# Patient Record
Sex: Female | Born: 1975 | Race: White | Hispanic: Yes | Marital: Single | Smoking: Never smoker
Health system: Southern US, Community
[De-identification: ages and names within clinical notes are randomized; demographics above are authoritative.]

## PROBLEM LIST (undated history)

## (undated) ENCOUNTER — Emergency Department (HOSPITAL_COMMUNITY): Admission: EM | Payer: Self-pay | Source: Home / Self Care

## (undated) DIAGNOSIS — I10 Essential (primary) hypertension: Secondary | ICD-10-CM

## (undated) DIAGNOSIS — G709 Myoneural disorder, unspecified: Secondary | ICD-10-CM

## (undated) DIAGNOSIS — N186 End stage renal disease: Secondary | ICD-10-CM

## (undated) DIAGNOSIS — E119 Type 2 diabetes mellitus without complications: Secondary | ICD-10-CM

## (undated) DIAGNOSIS — E7800 Pure hypercholesterolemia, unspecified: Secondary | ICD-10-CM

## (undated) HISTORY — PX: NO PAST SURGERIES: SHX2092

## (undated) HISTORY — PX: OTHER SURGICAL HISTORY: SHX169

## (undated) HISTORY — DX: End stage renal disease: N18.6

---

## 2003-04-12 ENCOUNTER — Inpatient Hospital Stay (HOSPITAL_COMMUNITY): Admission: AD | Admit: 2003-04-12 | Discharge: 2003-04-14 | Payer: Self-pay | Admitting: Obstetrics

## 2004-05-25 ENCOUNTER — Encounter (INDEPENDENT_AMBULATORY_CARE_PROVIDER_SITE_OTHER): Payer: Self-pay | Admitting: *Deleted

## 2004-05-25 LAB — CONVERTED CEMR LAB

## 2004-05-27 ENCOUNTER — Ambulatory Visit: Payer: Self-pay | Admitting: Family Medicine

## 2004-06-17 ENCOUNTER — Ambulatory Visit: Payer: Self-pay | Admitting: Family Medicine

## 2004-06-24 ENCOUNTER — Ambulatory Visit (HOSPITAL_COMMUNITY): Admission: RE | Admit: 2004-06-24 | Discharge: 2004-06-24 | Payer: Self-pay | Admitting: Family Medicine

## 2004-07-08 ENCOUNTER — Ambulatory Visit: Payer: Self-pay | Admitting: Family Medicine

## 2004-08-13 ENCOUNTER — Ambulatory Visit: Payer: Self-pay | Admitting: Family Medicine

## 2004-08-16 ENCOUNTER — Ambulatory Visit: Payer: Self-pay | Admitting: Sports Medicine

## 2004-08-28 ENCOUNTER — Encounter: Admission: RE | Admit: 2004-08-28 | Discharge: 2004-10-09 | Payer: Self-pay | Admitting: Family Medicine

## 2004-09-13 ENCOUNTER — Ambulatory Visit: Payer: Self-pay | Admitting: Family Medicine

## 2004-09-30 ENCOUNTER — Ambulatory Visit: Payer: Self-pay | Admitting: Sports Medicine

## 2004-10-17 ENCOUNTER — Ambulatory Visit: Payer: Self-pay | Admitting: Family Medicine

## 2004-10-31 ENCOUNTER — Ambulatory Visit: Payer: Self-pay | Admitting: Sports Medicine

## 2004-11-01 ENCOUNTER — Ambulatory Visit: Payer: Self-pay | Admitting: *Deleted

## 2004-11-05 ENCOUNTER — Ambulatory Visit: Payer: Self-pay | Admitting: *Deleted

## 2004-11-07 ENCOUNTER — Ambulatory Visit: Payer: Self-pay | Admitting: Family Medicine

## 2004-11-08 ENCOUNTER — Ambulatory Visit (HOSPITAL_COMMUNITY): Admission: RE | Admit: 2004-11-08 | Discharge: 2004-11-08 | Payer: Self-pay | Admitting: Family Medicine

## 2004-11-08 ENCOUNTER — Ambulatory Visit: Payer: Self-pay | Admitting: *Deleted

## 2004-11-14 ENCOUNTER — Ambulatory Visit: Payer: Self-pay | Admitting: Sports Medicine

## 2004-11-15 ENCOUNTER — Ambulatory Visit (HOSPITAL_COMMUNITY): Admission: RE | Admit: 2004-11-15 | Discharge: 2004-11-15 | Payer: Self-pay | Admitting: Family Medicine

## 2004-11-15 ENCOUNTER — Ambulatory Visit: Payer: Self-pay | Admitting: *Deleted

## 2004-11-18 ENCOUNTER — Inpatient Hospital Stay (HOSPITAL_COMMUNITY): Admission: AD | Admit: 2004-11-18 | Discharge: 2004-11-20 | Payer: Self-pay | Admitting: Family Medicine

## 2004-11-18 ENCOUNTER — Ambulatory Visit: Payer: Self-pay | Admitting: Family Medicine

## 2005-01-01 ENCOUNTER — Ambulatory Visit: Payer: Self-pay | Admitting: Family Medicine

## 2005-03-27 ENCOUNTER — Ambulatory Visit: Payer: Self-pay | Admitting: Sports Medicine

## 2005-06-16 ENCOUNTER — Ambulatory Visit: Payer: Self-pay | Admitting: Sports Medicine

## 2006-10-23 ENCOUNTER — Encounter (INDEPENDENT_AMBULATORY_CARE_PROVIDER_SITE_OTHER): Payer: Self-pay | Admitting: *Deleted

## 2008-08-02 ENCOUNTER — Inpatient Hospital Stay (HOSPITAL_COMMUNITY): Admission: AD | Admit: 2008-08-02 | Discharge: 2008-08-02 | Payer: Self-pay | Admitting: Obstetrics & Gynecology

## 2008-08-07 ENCOUNTER — Encounter: Admission: RE | Admit: 2008-08-07 | Discharge: 2008-11-05 | Payer: Self-pay | Admitting: Family Medicine

## 2008-08-07 ENCOUNTER — Ambulatory Visit: Payer: Self-pay | Admitting: Family Medicine

## 2008-08-08 ENCOUNTER — Ambulatory Visit: Payer: Self-pay | Admitting: Obstetrics & Gynecology

## 2008-08-08 ENCOUNTER — Encounter: Payer: Self-pay | Admitting: Family Medicine

## 2008-08-08 LAB — CONVERTED CEMR LAB
ALT: 11 units/L (ref 0–35)
AST: 8 units/L (ref 0–37)
Albumin: 3.9 g/dL (ref 3.5–5.2)
Alkaline Phosphatase: 86 units/L (ref 39–117)
BUN: 9 mg/dL (ref 6–23)
CO2: 18 meq/L — ABNORMAL LOW (ref 19–32)
Calcium: 8.6 mg/dL (ref 8.4–10.5)
Chloride: 102 meq/L (ref 96–112)
Creatinine, Ser: 0.37 mg/dL — ABNORMAL LOW (ref 0.40–1.20)
Glucose, Bld: 171 mg/dL — ABNORMAL HIGH (ref 70–99)
HCT: 42.1 % (ref 36.0–46.0)
Hemoglobin: 14.1 g/dL (ref 12.0–15.0)
Hgb A1c MFr Bld: 7 % — ABNORMAL HIGH (ref 4.6–6.1)
MCHC: 33.5 g/dL (ref 30.0–36.0)
MCV: 90.5 fL (ref 78.0–100.0)
Platelets: 248 10*3/uL (ref 150–400)
Potassium: 3.9 meq/L (ref 3.5–5.3)
RBC: 4.65 M/uL (ref 3.87–5.11)
RDW: 14.3 % (ref 11.5–15.5)
Sodium: 138 meq/L (ref 135–145)
TSH: 0.392 microintl units/mL (ref 0.350–4.50)
Total Bilirubin: 0.5 mg/dL (ref 0.3–1.2)
Total Protein: 6.7 g/dL (ref 6.0–8.3)
Uric Acid, Serum: 2.8 mg/dL (ref 2.4–7.0)
WBC: 10.1 10*3/uL (ref 4.0–10.5)

## 2008-08-09 ENCOUNTER — Encounter: Payer: Self-pay | Admitting: Family Medicine

## 2008-08-09 LAB — CONVERTED CEMR LAB
Collection Interval-CRCL: 24 hr
Creatinine 24 HR UR: 686 mg/24hr — ABNORMAL LOW (ref 700–1800)
Creatinine Clearance: 129 mL/min — ABNORMAL HIGH (ref 75–115)
Creatinine, Urine: 33.5 mg/dL
Protein, Ur: 62 mg/24hr (ref 50–100)

## 2008-08-14 ENCOUNTER — Ambulatory Visit: Payer: Self-pay | Admitting: Family Medicine

## 2008-08-14 ENCOUNTER — Inpatient Hospital Stay (HOSPITAL_COMMUNITY): Admission: AD | Admit: 2008-08-14 | Discharge: 2008-08-18 | Payer: Self-pay | Admitting: Family Medicine

## 2008-08-21 ENCOUNTER — Ambulatory Visit: Payer: Self-pay | Admitting: Family Medicine

## 2008-08-28 ENCOUNTER — Ambulatory Visit: Payer: Self-pay | Admitting: Obstetrics & Gynecology

## 2008-09-04 ENCOUNTER — Ambulatory Visit: Payer: Self-pay | Admitting: Family Medicine

## 2008-09-04 ENCOUNTER — Ambulatory Visit (HOSPITAL_COMMUNITY): Admission: RE | Admit: 2008-09-04 | Discharge: 2008-09-04 | Payer: Self-pay | Admitting: Family Medicine

## 2008-09-05 ENCOUNTER — Encounter: Payer: Self-pay | Admitting: Family Medicine

## 2008-09-11 ENCOUNTER — Ambulatory Visit: Payer: Self-pay | Admitting: Family Medicine

## 2008-09-11 LAB — CONVERTED CEMR LAB
HCT: 41.3 % (ref 36.0–46.0)
Hemoglobin: 13.5 g/dL (ref 12.0–15.0)
MCHC: 32.7 g/dL (ref 30.0–36.0)
MCV: 91 fL (ref 78.0–100.0)
Platelets: 294 10*3/uL (ref 150–400)
RBC: 4.54 M/uL (ref 3.87–5.11)
RDW: 14 % (ref 11.5–15.5)
WBC: 13 10*3/uL — ABNORMAL HIGH (ref 4.0–10.5)

## 2008-09-18 ENCOUNTER — Ambulatory Visit: Payer: Self-pay | Admitting: Obstetrics & Gynecology

## 2008-09-25 ENCOUNTER — Ambulatory Visit: Payer: Self-pay | Admitting: Family Medicine

## 2008-10-02 ENCOUNTER — Ambulatory Visit: Payer: Self-pay | Admitting: Obstetrics & Gynecology

## 2008-10-05 ENCOUNTER — Ambulatory Visit: Payer: Self-pay | Admitting: Obstetrics & Gynecology

## 2008-10-09 ENCOUNTER — Ambulatory Visit: Payer: Self-pay | Admitting: Obstetrics & Gynecology

## 2008-10-12 ENCOUNTER — Ambulatory Visit: Payer: Self-pay | Admitting: Family Medicine

## 2008-10-16 ENCOUNTER — Ambulatory Visit: Payer: Self-pay | Admitting: Family Medicine

## 2008-10-19 ENCOUNTER — Ambulatory Visit: Payer: Self-pay | Admitting: Obstetrics & Gynecology

## 2008-10-23 ENCOUNTER — Ambulatory Visit: Payer: Self-pay | Admitting: Obstetrics & Gynecology

## 2008-10-23 ENCOUNTER — Ambulatory Visit (HOSPITAL_COMMUNITY): Admission: RE | Admit: 2008-10-23 | Discharge: 2008-10-23 | Payer: Self-pay | Admitting: Family Medicine

## 2008-10-26 ENCOUNTER — Ambulatory Visit: Payer: Self-pay | Admitting: Family Medicine

## 2008-10-30 ENCOUNTER — Ambulatory Visit: Payer: Self-pay | Admitting: Obstetrics & Gynecology

## 2008-10-30 LAB — CONVERTED CEMR LAB
Chlamydia, DNA Probe: NEGATIVE
GC Probe Amp, Genital: NEGATIVE

## 2008-10-31 ENCOUNTER — Encounter: Payer: Self-pay | Admitting: Obstetrics & Gynecology

## 2008-11-02 ENCOUNTER — Ambulatory Visit: Payer: Self-pay | Admitting: Family Medicine

## 2008-11-06 ENCOUNTER — Ambulatory Visit: Payer: Self-pay | Admitting: Obstetrics & Gynecology

## 2008-11-13 ENCOUNTER — Ambulatory Visit: Payer: Self-pay | Admitting: Family Medicine

## 2008-11-16 ENCOUNTER — Ambulatory Visit: Payer: Self-pay | Admitting: Obstetrics & Gynecology

## 2008-11-19 ENCOUNTER — Ambulatory Visit: Payer: Self-pay | Admitting: Obstetrics and Gynecology

## 2008-11-19 ENCOUNTER — Inpatient Hospital Stay (HOSPITAL_COMMUNITY): Admission: AD | Admit: 2008-11-19 | Discharge: 2008-11-21 | Payer: Self-pay | Admitting: Family Medicine

## 2010-12-05 LAB — GLUCOSE, CAPILLARY
Glucose-Capillary: 108 mg/dL — ABNORMAL HIGH (ref 70–99)
Glucose-Capillary: 110 mg/dL — ABNORMAL HIGH (ref 70–99)
Glucose-Capillary: 114 mg/dL — ABNORMAL HIGH (ref 70–99)
Glucose-Capillary: 155 mg/dL — ABNORMAL HIGH (ref 70–99)
Glucose-Capillary: 158 mg/dL — ABNORMAL HIGH (ref 70–99)
Glucose-Capillary: 204 mg/dL — ABNORMAL HIGH (ref 70–99)
Glucose-Capillary: 49 mg/dL — ABNORMAL LOW (ref 70–99)
Glucose-Capillary: 84 mg/dL (ref 70–99)
Glucose-Capillary: 92 mg/dL (ref 70–99)
Glucose-Capillary: 95 mg/dL (ref 70–99)
Glucose-Capillary: 98 mg/dL (ref 70–99)

## 2010-12-05 LAB — POCT URINALYSIS DIP (DEVICE)
Bilirubin Urine: NEGATIVE
Bilirubin Urine: NEGATIVE
Bilirubin Urine: NEGATIVE
Glucose, UA: NEGATIVE mg/dL
Glucose, UA: NEGATIVE mg/dL
Hgb urine dipstick: NEGATIVE
Hgb urine dipstick: NEGATIVE
Hgb urine dipstick: NEGATIVE
Ketones, ur: 15 mg/dL — AB
Ketones, ur: NEGATIVE mg/dL
Nitrite: NEGATIVE
Nitrite: NEGATIVE
Nitrite: NEGATIVE
Protein, ur: NEGATIVE mg/dL
Protein, ur: NEGATIVE mg/dL
Specific Gravity, Urine: 1.01 (ref 1.005–1.030)
Specific Gravity, Urine: 1.015 (ref 1.005–1.030)
Urobilinogen, UA: 0.2 mg/dL (ref 0.0–1.0)
pH: 6 (ref 5.0–8.0)
pH: 6.5 (ref 5.0–8.0)
pH: 6.5 (ref 5.0–8.0)

## 2010-12-05 LAB — CBC
HCT: 34.5 % — ABNORMAL LOW (ref 36.0–46.0)
HCT: 41.7 % (ref 36.0–46.0)
Hemoglobin: 12 g/dL (ref 12.0–15.0)
Hemoglobin: 14.3 g/dL (ref 12.0–15.0)
MCHC: 34.3 g/dL (ref 30.0–36.0)
MCHC: 34.7 g/dL (ref 30.0–36.0)
MCV: 92.4 fL (ref 78.0–100.0)
MCV: 92.4 fL (ref 78.0–100.0)
Platelets: 160 10*3/uL (ref 150–400)
Platelets: 179 10*3/uL (ref 150–400)
RBC: 3.73 MIL/uL — ABNORMAL LOW (ref 3.87–5.11)
RBC: 4.52 MIL/uL (ref 3.87–5.11)
RDW: 14.2 % (ref 11.5–15.5)
RDW: 14.2 % (ref 11.5–15.5)
WBC: 11.9 10*3/uL — ABNORMAL HIGH (ref 4.0–10.5)
WBC: 13.3 10*3/uL — ABNORMAL HIGH (ref 4.0–10.5)

## 2010-12-05 LAB — RPR: RPR Ser Ql: NONREACTIVE

## 2010-12-09 LAB — POCT URINALYSIS DIP (DEVICE)
Bilirubin Urine: NEGATIVE
Bilirubin Urine: NEGATIVE
Glucose, UA: NEGATIVE mg/dL
Hgb urine dipstick: NEGATIVE
Hgb urine dipstick: NEGATIVE
Hgb urine dipstick: NEGATIVE
Ketones, ur: NEGATIVE mg/dL
Ketones, ur: NEGATIVE mg/dL
Ketones, ur: NEGATIVE mg/dL
Protein, ur: NEGATIVE mg/dL
Protein, ur: NEGATIVE mg/dL
Specific Gravity, Urine: 1.01 (ref 1.005–1.030)
Specific Gravity, Urine: 1.015 (ref 1.005–1.030)
Specific Gravity, Urine: 1.01 (ref 1.005–1.030)
Specific Gravity, Urine: 1.01 (ref 1.005–1.030)
Urobilinogen, UA: 0.2 mg/dL (ref 0.0–1.0)
pH: 6.5 (ref 5.0–8.0)
pH: 6.5 (ref 5.0–8.0)

## 2010-12-10 LAB — POCT URINALYSIS DIP (DEVICE)
Bilirubin Urine: NEGATIVE
Hgb urine dipstick: NEGATIVE
Hgb urine dipstick: NEGATIVE
Hgb urine dipstick: NEGATIVE
Ketones, ur: NEGATIVE mg/dL
Nitrite: NEGATIVE
Protein, ur: NEGATIVE mg/dL
Protein, ur: NEGATIVE mg/dL
Protein, ur: NEGATIVE mg/dL
Specific Gravity, Urine: 1.015 (ref 1.005–1.030)
Specific Gravity, Urine: <=1.005 (ref 1.005–1.030)
Specific Gravity, Urine: <=1.005 (ref 1.005–1.030)
Urobilinogen, UA: 0.2 mg/dL (ref 0.0–1.0)
Urobilinogen, UA: 0.2 mg/dL (ref 0.0–1.0)
Urobilinogen, UA: 0.2 mg/dL (ref 0.0–1.0)
pH: 6 (ref 5.0–8.0)
pH: 7 (ref 5.0–8.0)
pH: 5.5 (ref 5.0–8.0)

## 2011-01-07 NOTE — Discharge Summary (Signed)
Michele Morales, Michele Morales             ACCOUNT NO.:  [id number]   MEDICAL RECORD NO.:  [id number]          PATIENT TYPE:  INP   LOCATION:                                FACILITY:  [facility]   PHYSICIAN:  Tilda Burrow, M.D. DATE OF BIRTH:  1975-12-20   DATE OF ADMISSION:  08/14/2008  DATE OF DISCHARGE:  08/18/2008                               DISCHARGE SUMMARY   DISCHARGE DIAGNOSES:  1. Gestational diabetes type A2.  2. Polyhydramnios.  3. Late presentation for prenatal care.   REASON FOR ADMISSION:  Mr. Michele Morales is a 35 year old gravida 6,  para 5-0-0-5 who was admitted at 38 and 1/7th weeks' gestational age  with uncontrolled blood glucoses.  [facility] 24 weeks and early 1-hour  Glucola at that time was found to be 287.  She was transferred to the  [facility] and on her first visit she was found to have a fasting  blood glucose of 270.  She was admitted for glucose control and better  monitoring of her insulin regimen.   HOSPITAL COURSE:  The patient was admitted and over the course of  several days her insulin regimen was titrated to appropriate dosage.  Of  note, late at night on August 16, 2008, near midnight the patient had  an episode of symptomatic hypoglycemia.  She had a dinner dose of 18  units of regular.  Her postprandial dinner was 159.  She received 4 more  units of insulin then at 9:30 p.m., she received 18 units of NPH and  approximately midnight she had a blood sugar of 51.  She quickly  responded and rechecked, 1 hour later her blood sugar was 95.  At the  time of discharge, the patient has no complaints.  She has no headache  or abdominal pain.  She is not having any contractions and she has  active fetal movement.  Her insulin for discharge will be :  35 units of NPH and 25 units of regular every morning.  She will take 20 units of regular before dinner and 16 units of NPH  at  bedtime.  During last hospitalization, she has met with Nutrition as well as had  diabetic teaching.  The patient has followup appointment in the [facility] for Monday, August 21, 2008.   PROCEDURES:  None.   FINDINGS:  The patient had an ultrasound from August 02, 2008, which  showed subjectively increased amniotic fluid and a diagnosis of early  polyhydramnios.   INSTRUCTIONS:  The patient was given instructions on her insulin regimen  as well as appointment for followup.  She was given instructions on what  to do of further recurrences of low blood sugar as well as return to the  MAU for blood sugars that are consistently above 200.   CONDITION ON DISCHARGE:  Good.   FOLLOWUP CARE:  As per hospital course at the [facility] on  August 21, 2008.      Odie Sera, DO  Electronically Signed     ______________________________  Tilda Burrow, M.D.    [facility]/MEDQ  D:  08/17/2008  T:  08/17/2008  Job:  [id number]

## 2011-01-07 NOTE — Discharge Summary (Signed)
NAMEARMILDA, Michele Morales             ACCOUNT NO.:  [id number]   MEDICAL RECORD NO.:  [id number]          PATIENT TYPE:  INP   LOCATION:  9157                          [facility]ILITY:  [facility]   PHYSICIAN:  Norton Blizzard, MD    DATE OF BIRTH:  11-05-1975   DATE OF ADMISSION:  08/14/2008  DATE OF DISCHARGE:  08/18/2008                               DISCHARGE SUMMARY   DISCHARGE SUMMARY ADDENDUM  The patient is a 35 year old, gravida 6, para 5-0-0-5, who was admitted  at 25-1/7th weeks for control of gestational diabetes.  She was to be  discharged on August 17, 2008; please refer to that separate dictated  discharge summary.  However, the patient was noted to have symptomatic  hypoglycemia that morning and also had some issues with teaching that  was not done regarding self-administration of insulin dosage.  For the  rest of the day on the August 17, 2008, her sugars were markedly  improved, and she did receive teaching concerning the injections and  drawing up appropriate insulin dosages.  This was done using the help of  a Spanish interpreter.  She also had reassuring fetal heart tracing in  the 140s with moderate variability, small accelerations and no  decelerations.  The patient was discharged to home in stable condition  on August 18, 2008.  She was given the routine antenatal discharge  instruction sheet in Spanish and also given a prescription for NPH 35  units in the morning, 16 units at bedtime, and Regular insulin 25 units  in the morning and 20 units at bedtime.  The patient was given preterm  labor precautions and was told to follow up for her appointment that was  scheduled on August 21, 2008 at 10:00 a.m in the [facility].      Norton Blizzard, MD  Electronically Signed     UAD/MEDQ  D:  08/18/2008  T:  08/18/2008  Job:  [phone] [id number]

## 2011-01-10 NOTE — Op Note (Signed)
   NAMECLEON, THOMA                         ACCOUNT NO.:  [id number]   MEDICAL RECORD NO.:  [id number]                   PATIENT TYPE:  INP   LOCATION:  9109                                 [facility]ILITY:  [facility]   PHYSICIAN:  Kathreen Cosier, M.D.           DATE OF BIRTH:  February 01, 1976   DATE OF PROCEDURE:  04/12/2003  DATE OF DISCHARGE:                                 OPERATIVE REPORT   PROCEDURE:  Vacuum delivery.   The patient is a 35 year old gravida 4, para 3-0-0-3, who was brought in for  induction today.  Her EDC was April 12, 2003, and the cervix was 4 cm, 60%,  with the vertex -3 station.  At 7 cm she had an epidural; however, she made  rapid progress and was fully dilated and pushed to a +3 station.  After  about 30 minutes, however, the patient stated that she was tired and the  vacuum was applied through one contraction.  There was no pop-off, and she  had a normal vaginal delivery of a female, Apgar 8 and 9.  She had a second  degree perineal laceration, which was repaired with 2-0 Vicryl suture.  The  placenta delivered spontaneously intact.                                               Kathreen Cosier, M.D.    BAM/MEDQ  D:  04/12/2003  T:  04/13/2003  Job:  [id number]

## 2011-05-30 LAB — GLUCOSE, CAPILLARY
Glucose-Capillary: 122 mg/dL — ABNORMAL HIGH (ref 70–99)
Glucose-Capillary: 137 mg/dL — ABNORMAL HIGH (ref 70–99)
Glucose-Capillary: 158 mg/dL — ABNORMAL HIGH (ref 70–99)
Glucose-Capillary: 159 mg/dL — ABNORMAL HIGH (ref 70–99)
Glucose-Capillary: 166 mg/dL — ABNORMAL HIGH (ref 70–99)
Glucose-Capillary: 182 mg/dL — ABNORMAL HIGH (ref 70–99)
Glucose-Capillary: 205 mg/dL — ABNORMAL HIGH (ref 70–99)
Glucose-Capillary: 206 mg/dL — ABNORMAL HIGH (ref 70–99)
Glucose-Capillary: 226 mg/dL — ABNORMAL HIGH (ref 70–99)
Glucose-Capillary: 51 mg/dL — ABNORMAL LOW (ref 70–99)
Glucose-Capillary: 61 mg/dL — ABNORMAL LOW (ref 70–99)
Glucose-Capillary: 89 mg/dL (ref 70–99)

## 2011-05-30 LAB — POCT URINALYSIS DIP (DEVICE)
Bilirubin Urine: NEGATIVE
Hgb urine dipstick: NEGATIVE
Hgb urine dipstick: NEGATIVE
Hgb urine dipstick: NEGATIVE
Ketones, ur: NEGATIVE mg/dL
Ketones, ur: NEGATIVE mg/dL
Protein, ur: 30 mg/dL — AB
Protein, ur: NEGATIVE mg/dL
Protein, ur: NEGATIVE mg/dL
Specific Gravity, Urine: 1.01 (ref 1.005–1.030)
Specific Gravity, Urine: 1.02 (ref 1.005–1.030)
Specific Gravity, Urine: <=1.005 (ref 1.005–1.030)
Urobilinogen, UA: 0.2 mg/dL (ref 0.0–1.0)
Urobilinogen, UA: 0.2 mg/dL (ref 0.0–1.0)
pH: 5.5 (ref 5.0–8.0)
pH: 5.5 (ref 5.0–8.0)
pH: 6 (ref 5.0–8.0)

## 2011-05-30 LAB — TYPE AND SCREEN
ABO/RH(D): O POS
Antibody Screen: NEGATIVE

## 2016-04-04 ENCOUNTER — Emergency Department (HOSPITAL_COMMUNITY): Payer: Self-pay

## 2016-04-04 ENCOUNTER — Emergency Department (HOSPITAL_COMMUNITY)
Admission: EM | Admit: 2016-04-04 | Discharge: 2016-04-05 | Disposition: A | Discharge status: Home / Self Care | Payer: Self-pay | Attending: Emergency Medicine | Admitting: Emergency Medicine

## 2016-04-04 ENCOUNTER — Encounter (HOSPITAL_COMMUNITY): Payer: Self-pay | Admitting: *Deleted

## 2016-04-04 DIAGNOSIS — R079 Chest pain, unspecified: Secondary | ICD-10-CM

## 2016-04-04 DIAGNOSIS — R0789 Other chest pain: Secondary | ICD-10-CM | POA: Insufficient documentation

## 2016-04-04 DIAGNOSIS — E119 Type 2 diabetes mellitus without complications: Secondary | ICD-10-CM | POA: Insufficient documentation

## 2016-04-04 HISTORY — DX: Type 2 diabetes mellitus without complications: E11.9

## 2016-04-04 HISTORY — DX: Pure hypercholesterolemia, unspecified: E78.00

## 2016-04-04 LAB — BASIC METABOLIC PANEL
ANION GAP: 8 (ref 5–15)
BUN: 13 mg/dL (ref 6–20)
CALCIUM: 9.1 mg/dL (ref 8.9–10.3)
CHLORIDE: 97 mmol/L — AB (ref 101–111)
CO2: 24 mmol/L (ref 22–32)
CREATININE: 0.51 mg/dL (ref 0.44–1.00)
GFR calc non Af Amer: >60 mL/min (ref >60)
Glucose, Bld: 389 mg/dL — ABNORMAL HIGH (ref 65–99)
Potassium: 3.6 mmol/L (ref 3.5–5.1)
SODIUM: 129 mmol/L — AB (ref 135–145)

## 2016-04-04 LAB — CBC
HCT: 38.2 % (ref 36.0–46.0)
Hemoglobin: 12.8 g/dL (ref 12.0–15.0)
MCH: 28.3 pg (ref 26.0–34.0)
MCHC: 33.5 g/dL (ref 30.0–36.0)
MCV: 84.3 fL (ref 78.0–100.0)
PLATELETS: 425 10*3/uL — AB (ref 150–400)
RBC: 4.53 MIL/uL (ref 3.87–5.11)
RDW: 13.9 % (ref 11.5–15.5)
WBC: 10 10*3/uL (ref 4.0–10.5)

## 2016-04-04 LAB — I-STAT TROPONIN, ED: TROPONIN I, POC: 0.01 ng/mL (ref 0.00–0.08)

## 2016-04-04 MED ORDER — SODIUM CHLORIDE 0.9 % IV BOLUS (SEPSIS)
1000.0000 mL | Freq: Once | INTRAVENOUS | Status: AC
  Administered 2016-04-05: 1000 mL via INTRAVENOUS

## 2016-04-04 NOTE — ED Triage Notes (Signed)
Pt reports left arm and left chest pain while at work tonight. Onset occurred 2-3 hours prior to ED arrival. Pt also stated she felt like she was having a minor panic attack. Pain was associated with shortness of breath, dizziness, lightheadedness, and nausea. Pt denies nausea at this time.

## 2016-04-05 LAB — D-DIMER, QUANTITATIVE: D-Dimer, Quant: <0.27 ug/mL-FEU (ref 0.00–0.50)

## 2016-04-05 LAB — I-STAT TROPONIN, ED: TROPONIN I, POC: 0.01 ng/mL (ref 0.00–0.08)

## 2016-04-05 NOTE — Discharge Instructions (Signed)
Read the information below.   EKG and imaging were re-assuring.You have remained without symptoms in the ED.  Your sugars were high - I encourage you to follow up with your primary care doctor for further management of high sugars.  I encourage you to follow up with your primary care provider within the next week for re-evaluation. If you do not have a primary care provider, I encourage you to call and schedule a follow up with [facility].  You may return to the Emergency Department at any time for worsening condition or any new symptoms that concern you. Return to ED if your symptoms worsen or you develop fever, coughing up blood, constant chest pain, shortness of breath, or loss of consciousness.

## 2016-04-05 NOTE — ED Notes (Signed)
Patient denies chest pain at this point.  Has not had any chest pain since being in the emergency room.  Will continue to monitor

## 2016-04-05 NOTE — ED Provider Notes (Signed)
[facility] Provider Note   CSN: [id number] Arrival date & time: 04/04/16  2025  First Provider Contact:  First MD Initiated Contact with Patient 04/05/16 0230        History   Chief Complaint Chief Complaint  Patient presents with  . Chest Pain  . Arm Pain    HPI Launa FlightMaria Sweezy is a 40 y.o. female.  Launa FlightMaria Koike is a 40 y.o. female with history of diabetes and high cholesterol presents to ED with complaint of shortness of breath and chest pain. Patient states symptoms started at approximately 4 PM yesterday afternoon while she was at work. She works in a [employer], [employer] placing stickers on items. Patient describes the chest pain as a sharp sensation, located on the left chest with radiation into back and left arm. Pain is aggravated with certain movements and deep inspiration. She had associated nausea and lightheadedness. She also endorses on occasion bilateral lower leg swelling. Leg swelling typically is worse at the end of the day after she has been on her feet at work all day and is relieved with elevation. She denies cough, lower leg pain, fever, sore throat, watery eyes, abdominal pain, vomiting, diarrhea, constipation, urinary complaints, numbness, weakness. She denies any recent long-distance travel/hospitalizations/surgery, history of blood clot, history of cancer, hemoptysis, or OCP use. No history of cardiac conditions. She is a nonsmoker. No family history of cardiac conditions. Patient is currently asymptomatic.   The history is provided by the patient. The history is limited by a language barrier. A language interpreter was used.    Past Medical History:  Diagnosis Date  . Diabetes mellitus without complication (HCC)   . Hypercholesteremia     There are no active problems to display for this patient.   History reviewed. No pertinent surgical history.  OB History    No data available       Home Medications    Prior to Admission medications   Not on  File    Family History History reviewed. No pertinent family history.  Social History Social History  Substance Use Topics  . Smoking status: Never Smoker  . Smokeless tobacco: Never Used  . Alcohol use No     Allergies   Review of patient's allergies indicates no known allergies.   Review of Systems Review of Systems  Respiratory: Positive for chest tightness ( resolved) and shortness of breath ( resolved).   Cardiovascular: Positive for chest pain ( resolved).  Gastrointestinal: Positive for nausea ( resolved).  Neurological: Positive for light-headedness ( resolved).  All other systems reviewed and are negative.    Physical Exam Updated Vital Signs BP 109/75   Pulse 93   Temp 97.7 F (36.5 C) (Oral)   Resp 14   Ht 4\' 11"  (1.499 m)   Wt 59 kg   LMP 03/28/2016   SpO2 100%   BMI 26.26 kg/m   Physical Exam  Constitutional: She appears well-developed and well-nourished. No distress.  HENT:  Head: Normocephalic and atraumatic.  Mouth/Throat: Oropharynx is clear and moist. No oropharyngeal exudate.  Eyes: Conjunctivae and EOM are normal. Pupils are equal, round, and reactive to light. Right eye exhibits no discharge. Left eye exhibits no discharge. No scleral icterus.  Neck: Normal range of motion. Neck supple.  Cardiovascular: Normal rate, regular rhythm, normal heart sounds and intact distal pulses.   No murmur heard. Pulmonary/Chest: Effort normal and breath sounds normal. No respiratory distress. She exhibits no tenderness.  Abdominal: Soft. Bowel sounds are normal. There  is no tenderness. There is no rebound and no guarding.  Musculoskeletal: Normal range of motion. She exhibits no edema or tenderness.  No evidence of lower extremity swelling. No tenderness to palpation of posterior calf. No palpable cords noted.  Lymphadenopathy:    She has no cervical adenopathy.  Neurological: She is alert. Coordination normal.  Skin: Skin is warm and dry. She is not  diaphoretic.  Psychiatric: She has a normal mood and affect.     ED Treatments / Results  Labs (all labs ordered are listed, but only abnormal results are displayed) Labs Reviewed  BASIC METABOLIC PANEL - Abnormal; Notable for the following:       Result Value   Sodium 129 (*)    Chloride 97 (*)    Glucose, Bld 389 (*)    All other components within normal limits  CBC - Abnormal; Notable for the following:    Platelets 425 (*)    All other components within normal limits  D-DIMER, QUANTITATIVE (NOT AT [facility])  Rosezena Sensor, ED  I-STAT TROPOININ, ED    EKG  EKG Interpretation  Date/Time:  Friday April 04 2016 20:35:29 EDT Ventricular Rate:  103 PR Interval:  144 QRS Duration: 78 QT Interval:  358 QTC Calculation: 468 R Axis:   83 Text Interpretation:  Sinus tachycardia Borderline ECG No old tracing to compare Confirmed by Erroll Luna [phone]) on 04/04/2016 11:30:18 PM       Radiology Dg Chest 2 View  Result Date: 04/04/2016 CLINICAL DATA:  Pt reports left arm and left chest pain while at work tonight. Onset occurred 2-3 hours prior to ED arrival. Pt also stated she felt like she was having a minor panic attack. Pain was associated with shortness of breath, dizziness, lightheadedness, and nausea. Pt denies nausea at this time EXAM: CHEST  2 VIEW COMPARISON:  None. FINDINGS: The heart size and mediastinal contours are within normal limits. Both lungs are clear. No pleural effusion or pneumothorax. Skeletal structures are intact. IMPRESSION: No active cardiopulmonary disease. Electronically Signed   By: Amie Portland M.D.   On: 04/04/2016 21:03    Procedures Procedures (including critical care time)  Medications Ordered in ED Medications  sodium chloride 0.9 % bolus 1,000 mL (0 mLs Intravenous Stopped 04/05/16 0420)     Initial Impression / Assessment and Plan / ED Course  I have reviewed the triage vital signs and the nursing notes.  Pertinent labs &  imaging results that were available during my care of the patient were reviewed by me and considered in my medical decision making (see chart for details).  Clinical Course  Value Comment By Time  DG Chest 2 View Normal cardiac silhouette. No evidence of consolidation, effusion, PTX. No blunting of angles. No free air under diaphragm. Lona Kettle, PA-C 08/12 0200  EKG 12-Lead Sinus tachycardia. Intervals within normal limits. No axis deviation. No hypertrophy. No ST-T wave changes. Lona Kettle, PA-C 08/12 0200    Patient is afebrile and nontoxic-appearing in NAD. She is resting comfortably in bed. Currently asymptomatic. Her vital signs are stable. Physical exam is reassuring. BMP shows mild hyponatremia and hypochloremia. They be secondary to dehydration. IV fluids given. Hyperglycemia noted however no evidence of anion gap. CBC is reassuring. Delta troponin negative. D-dimer is negative-low suspicion for PE. Chest x-ray negative for acute cardiopulmonary process. EKG shows sinus tachycardia, however, on re-evaluation patient is not tachycardic. Heart score 2.  Low suspicion for cardiac etiology. Unknown cause of  chest pain. Discussed results and plan with patient using an interpreter. Encourage patient to follow-up with her primary care provider for further evaluation. Provided contact information for [facility] if patient does not have a PCP. Return precautions discussed. Patient voiced understanding and is agreeable.  Final Clinical Impressions(s) / ED Diagnoses   Final diagnoses:  Chest pain, unspecified chest pain type    New Prescriptions There are no discharge medications for this patient.    Lona Kettle, [place] 04/05/16 1035    Zadie Rhine, MD 04/05/16 [phone]

## 2016-04-05 NOTE — ED Notes (Addendum)
Attempted PIV placement x2 without success.  Sites attempted to right hand and right ACF.  Both sights "blew" upon insertion of the IV catheter.

## 2017-03-22 DIAGNOSIS — R110 Nausea: Secondary | ICD-10-CM | POA: Insufficient documentation

## 2017-03-22 DIAGNOSIS — Z3A12 12 weeks gestation of pregnancy: Secondary | ICD-10-CM | POA: Insufficient documentation

## 2017-03-22 DIAGNOSIS — O24111 Pre-existing diabetes mellitus, type 2, in pregnancy, first trimester: Secondary | ICD-10-CM | POA: Insufficient documentation

## 2017-03-22 DIAGNOSIS — E119 Type 2 diabetes mellitus without complications: Secondary | ICD-10-CM | POA: Insufficient documentation

## 2017-03-23 ENCOUNTER — Emergency Department (HOSPITAL_COMMUNITY)
Admission: EM | Admit: 2017-03-23 | Discharge: 2017-03-23 | Disposition: A | Discharge status: Home / Self Care | Payer: Self-pay | Attending: Emergency Medicine | Admitting: Emergency Medicine

## 2017-03-23 ENCOUNTER — Encounter: Payer: Self-pay | Admitting: Emergency Medicine

## 2017-03-23 DIAGNOSIS — R739 Hyperglycemia, unspecified: Secondary | ICD-10-CM

## 2017-03-23 DIAGNOSIS — R110 Nausea: Secondary | ICD-10-CM

## 2017-03-23 LAB — LIPASE, BLOOD: Lipase: 21 U/L (ref 11–51)

## 2017-03-23 LAB — CBG MONITORING, ED: GLUCOSE-CAPILLARY: 203 mg/dL — AB (ref 65–99)

## 2017-03-23 LAB — URINALYSIS, ROUTINE W REFLEX MICROSCOPIC
Bilirubin Urine: NEGATIVE
Glucose, UA: >=500 mg/dL — AB
HGB URINE DIPSTICK: NEGATIVE
Ketones, ur: 20 mg/dL — AB
Leukocytes, UA: NEGATIVE
NITRITE: NEGATIVE
Protein, ur: NEGATIVE mg/dL
RBC / HPF: NONE SEEN RBC/hpf (ref 0–5)
Specific Gravity, Urine: 1.026 (ref 1.005–1.030)
pH: 6 (ref 5.0–8.0)

## 2017-03-23 LAB — CBC
HCT: 38.8 % (ref 36.0–46.0)
HEMOGLOBIN: 13.5 g/dL (ref 12.0–15.0)
MCH: 29.5 pg (ref 26.0–34.0)
MCHC: 34.8 g/dL (ref 30.0–36.0)
MCV: 84.7 fL (ref 78.0–100.0)
Platelets: 286 10*3/uL (ref 150–400)
RBC: 4.58 MIL/uL (ref 3.87–5.11)
RDW: 14.5 % (ref 11.5–15.5)
WBC: 11.9 10*3/uL — ABNORMAL HIGH (ref 4.0–10.5)

## 2017-03-23 LAB — COMPREHENSIVE METABOLIC PANEL
ALK PHOS: 61 U/L (ref 38–126)
ALT: 14 U/L (ref 14–54)
ANION GAP: 10 (ref 5–15)
AST: 14 U/L — ABNORMAL LOW (ref 15–41)
Albumin: 3.6 g/dL (ref 3.5–5.0)
BUN: 9 mg/dL (ref 6–20)
CALCIUM: 9 mg/dL (ref 8.9–10.3)
CO2: 21 mmol/L — AB (ref 22–32)
Chloride: 101 mmol/L (ref 101–111)
Creatinine, Ser: 0.47 mg/dL (ref 0.44–1.00)
GFR calc non Af Amer: >60 mL/min (ref >60)
Glucose, Bld: 284 mg/dL — ABNORMAL HIGH (ref 65–99)
POTASSIUM: 3.7 mmol/L (ref 3.5–5.1)
SODIUM: 132 mmol/L — AB (ref 135–145)
Total Bilirubin: 0.8 mg/dL (ref 0.3–1.2)
Total Protein: 6.9 g/dL (ref 6.5–8.1)

## 2017-03-23 LAB — HCG, QUANTITATIVE, PREGNANCY: HCG, BETA CHAIN, QUANT, S: 68185 m[IU]/mL — AB (ref <5)

## 2017-03-23 MED ORDER — ONDANSETRON HCL 4 MG PO TABS: 4.0000 mg | ORAL_TABLET | Freq: Four times a day (QID) | ORAL | 0 refills | Status: DC

## 2017-03-23 MED ORDER — SODIUM CHLORIDE 0.9 % IV BOLUS (SEPSIS)
1000.0000 mL | Freq: Once | INTRAVENOUS | Status: AC
  Administered 2017-03-23: 1000 mL via INTRAVENOUS

## 2017-03-23 MED ORDER — ONDANSETRON HCL 4 MG/2ML IJ SOLN
4.0000 mg | Freq: Once | INTRAMUSCULAR | Status: AC
  Administered 2017-03-23: 4 mg via INTRAVENOUS
  Filled 2017-03-23: qty 2

## 2017-03-23 NOTE — ED Triage Notes (Signed)
Patient arrives with complaint of "my diabetes acting up". Explains that she has abdominal pain, dizziness, and vomiting. Onset today at 1100. Information obtained via translation service.

## 2017-03-23 NOTE — ED Notes (Signed)
D/c instructions given using video interpretor service

## 2017-03-23 NOTE — ED Triage Notes (Signed)
Patient [redacted] weeks pregnant.

## 2017-03-23 NOTE — ED Notes (Signed)
Pt resting on stretcher with eyes closed, RR even and unlabored, NAD, VSS, family at bedside

## 2017-03-23 NOTE — Discharge Instructions (Signed)
Please keep your upcoming appointment for prenatal care. Take Zofran for nausea. If you have any worsening nausea, any abdominal pain, vaginal bleeding or high fever, go to [facility] for [facility] evaluation. It is important to take your medications for diabetes as prescribed.

## 2017-03-23 NOTE — ED Provider Notes (Signed)
[facility] Provider Note   CSN: [id number] Arrival date & time: 03/22/17  2352     History   Chief Complaint Chief Complaint  Patient presents with  . Emesis  . Diabetes    HPI Launa FlightMaria Rodas is a 41 y.o. female.  Patient is known to be pregnant presents with nausea that started around 11:00 last night (03/22/17). She describes abdominal pressure, but no vaginal bleeding, dysuria or vaginal discharge. No fever. She reports she is estimated to be about [redacted] weeks pregnant and denies nausea previously during this pregnancy. She has a history of DM but reports she does not take her oral antidiabetic medication.    The history is provided by the patient and the spouse. A language interpreter was used Animator(Video interpreter for Spanish used).  Emesis   Pertinent negatives include no chills and no fever.  Diabetes  Pertinent negatives include no chest pain and no shortness of breath.    Past Medical History:  Diagnosis Date  . Diabetes mellitus without complication (HCC)   . Hypercholesteremia     There are no active problems to display for this patient.   No past surgical history on file.  OB History    Gravida Para Term Preterm AB Living   1             SAB TAB Ectopic Multiple Live Births                   Home Medications    Prior to Admission medications   Not on File    Family History No family history on file.  Social History Social History  Substance Use Topics  . Smoking status: Never Smoker  . Smokeless tobacco: Never Used  . Alcohol use No     Allergies   Patient has no known allergies.   Review of Systems Review of Systems  Constitutional: Negative for chills and fever.  Respiratory: Negative.  Negative for shortness of breath.   Cardiovascular: Negative.  Negative for chest pain.  Gastrointestinal: Positive for nausea. Negative for vomiting.  Musculoskeletal: Negative.   Skin: Negative.   Neurological: Negative.       Physical Exam Updated Vital Signs BP 100/81   Pulse (!) 108   Temp 98.1 F (36.7 C) (Oral)   Resp 16   Ht 5\' 1"  (1.549 m)   Wt 56.7 kg (125 lb)   LMP 03/28/2016   SpO2 100%   BMI 23.62 kg/m   Physical Exam  Constitutional: She is oriented to person, place, and time. She appears well-developed and well-nourished.  HENT:  Head: Normocephalic.  Neck: Normal range of motion. Neck supple.  Cardiovascular: Normal rate and regular rhythm.   Pulmonary/Chest: Effort normal.  Abdominal: Soft. Bowel sounds are normal. There is no tenderness. There is no rebound and no guarding.  Gravid abdomen.  Musculoskeletal: Normal range of motion.  Neurological: She is alert and oriented to person, place, and time.  Skin: Skin is warm and dry. No rash noted.  Psychiatric: She has a normal mood and affect.     ED Treatments / Results  Labs (all labs ordered are listed, but only abnormal results are displayed) Labs Reviewed  COMPREHENSIVE METABOLIC PANEL - Abnormal; Notable for the following:       Result Value   Sodium 132 (*)    CO2 21 (*)    Glucose, Bld 284 (*)    AST 14 (*)    All other components within normal  limits  CBC - Abnormal; Notable for the following:    WBC 11.9 (*)    All other components within normal limits  URINALYSIS, ROUTINE W REFLEX MICROSCOPIC - Abnormal; Notable for the following:    APPearance HAZY (*)    Glucose, UA >=500 (*)    Ketones, ur 20 (*)    Bacteria, UA MANY (*)    Squamous Epithelial / LPF 0-5 (*)    All other components within normal limits  LIPASE, BLOOD  HCG, QUANTITATIVE, PREGNANCY    EKG  EKG Interpretation None       Radiology No results found.  Procedures Procedures (including critical care time)  Medications Ordered in ED Medications  sodium chloride 0.9 % bolus 1,000 mL (1,000 mLs Intravenous New Bag/Given 03/23/17 0250)  ondansetron (ZOFRAN) injection 4 mg (4 mg Intravenous Given 03/23/17 0250)     Initial  Impression / Assessment and Plan / ED Course  I have reviewed the triage vital signs and the nursing notes.  Pertinent labs & imaging results that were available during my care of the patient were reviewed by me and considered in my medical decision making (see chart for details).     Patient presents with nausea without vomiting x 1 day. She reports she is [redacted] weeks pregnant. No vaginal bleeding, significant abdominal pain, vaginal discharge, dysuria or fever. No nausea associated with this pregnancy before tonight.  IVF's given for hyperglycemia. No evidence acidosis. She reports nausea is resolved after Zofran. She is tolerating PO fluids.   She can be discharged home with Zofran for home use. Recommend follow up as scheduled to begin OB prenatal care.   Final Clinical Impressions(s) / ED Diagnoses   Final diagnoses:  None   1. Nausea  New Prescriptions New Prescriptions   No medications on file     Elpidio AnisUpstill, Traquan Duarte, Cordelia Poche-C 03/23/17 [phone]    Glynn Octaveancour, Stephen, MD 03/24/17 [phone]

## 2017-04-01 ENCOUNTER — Emergency Department (HOSPITAL_COMMUNITY)
Admission: EM | Admit: 2017-04-01 | Discharge: 2017-04-02 | Disposition: A | Discharge status: Home / Self Care | Payer: Self-pay | Attending: Emergency Medicine | Admitting: Emergency Medicine

## 2017-04-01 ENCOUNTER — Encounter (HOSPITAL_COMMUNITY): Payer: Self-pay

## 2017-04-01 ENCOUNTER — Emergency Department (HOSPITAL_COMMUNITY): Payer: Self-pay

## 2017-04-01 DIAGNOSIS — M25462 Effusion, left knee: Secondary | ICD-10-CM

## 2017-04-01 DIAGNOSIS — Y999 Unspecified external cause status: Secondary | ICD-10-CM | POA: Insufficient documentation

## 2017-04-01 DIAGNOSIS — Y929 Unspecified place or not applicable: Secondary | ICD-10-CM | POA: Insufficient documentation

## 2017-04-01 DIAGNOSIS — S82032A Displaced transverse fracture of left patella, initial encounter for closed fracture: Secondary | ICD-10-CM

## 2017-04-01 DIAGNOSIS — E119 Type 2 diabetes mellitus without complications: Secondary | ICD-10-CM | POA: Insufficient documentation

## 2017-04-01 DIAGNOSIS — Y939 Activity, unspecified: Secondary | ICD-10-CM | POA: Insufficient documentation

## 2017-04-01 DIAGNOSIS — W03XXXA Other fall on same level due to collision with another person, initial encounter: Secondary | ICD-10-CM | POA: Insufficient documentation

## 2017-04-01 MED ORDER — OXYCODONE-ACETAMINOPHEN 5-325 MG PO TABS
1.0000 | ORAL_TABLET | Freq: Once | ORAL | Status: AC
Start: 2017-04-02 — End: 2017-04-02
  Administered 2017-04-02: 1 via ORAL
  Filled 2017-04-01: qty 1
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
## 2017-04-09 LAB — OB RESULTS CONSOLE RPR: RPR: NONREACTIVE

## 2017-04-09 LAB — OB RESULTS CONSOLE GC/CHLAMYDIA
Chlamydia: NEGATIVE
GC PROBE AMP, GENITAL: NEGATIVE

## 2017-04-09 LAB — OB RESULTS CONSOLE HEPATITIS B SURFACE ANTIGEN: Hepatitis B Surface Ag: NEGATIVE

## 2017-04-09 LAB — OB RESULTS CONSOLE ABO/RH: RH TYPE: POSITIVE

## 2017-04-09 LAB — OB RESULTS CONSOLE VARICELLA ZOSTER ANTIBODY, IGG: Varicella: IMMUNE

## 2017-04-09 LAB — OB RESULTS CONSOLE HGB/HCT, BLOOD
HCT: 41
Hemoglobin: 13.4

## 2017-04-09 LAB — HEMOGLOBIN A1C: HEMOGLOBIN A1C: 9.8

## 2017-04-09 LAB — CYSTIC FIBROSIS DIAGNOSTIC STUDY: Interpretation-CFDNA:: NEGATIVE

## 2017-04-09 LAB — SICKLE CELL SCREEN: Sickle Cell Screen: NORMAL

## 2017-04-09 LAB — OB RESULTS CONSOLE RUBELLA ANTIBODY, IGM: Rubella: IMMUNE

## 2017-04-09 LAB — OB RESULTS CONSOLE PLATELET COUNT: Platelets: 370

## 2017-04-14 ENCOUNTER — Ambulatory Visit: Payer: Self-pay | Admitting: *Deleted

## 2017-04-14 ENCOUNTER — Encounter: Payer: Self-pay | Attending: Obstetrics and Gynecology | Admitting: *Deleted

## 2017-04-14 DIAGNOSIS — E1165 Type 2 diabetes mellitus with hyperglycemia: Secondary | ICD-10-CM

## 2017-04-14 DIAGNOSIS — Z3A00 Weeks of gestation of pregnancy not specified: Secondary | ICD-10-CM | POA: Insufficient documentation

## 2017-04-14 DIAGNOSIS — O24919 Unspecified diabetes mellitus in pregnancy, unspecified trimester: Secondary | ICD-10-CM | POA: Insufficient documentation

## 2017-04-14 DIAGNOSIS — O24111 Pre-existing diabetes mellitus, type 2, in pregnancy, first trimester: Principal | ICD-10-CM

## 2017-04-14 NOTE — Progress Notes (Signed)
  Patient was seen on 04/14/2017 for Type 2l Diabetes and pregnancy self-management. EDD: 09/27/2017, today is at 16 weeks 2days. Patient speaks Spanish, is here with her adult daughter who she agreed verbally to interpret for the visit as interpretor not available at beginning of visit. Interpretor did come and completed interpreting for remainder of visit. Patient states she is on Metformin 1000 mg and Glimiperide 2 mg and states she takes both as prescribed twice a day. Diet history obtained and it looks appropriate for pregnancy and diabetes. She states she is at home during the day with minimal exercise.  She states she does not have a meter at this time but is familiar how to use one. The following learning objectives were met by the patient :   States the definition of type 2 Diabetes and pregnancy  States why dietary management is important in controlling blood glucose  Describes the effects of carbohydrates on blood glucose levels  Demonstrates ability to create a balanced meal plan  Demonstrates carbohydrate counting   States when to check blood glucose levels  Demonstrates proper blood glucose monitoring techniques  States the effect of stress and exercise on blood glucose levels  States the importance of limiting caffeine and abstaining from alcohol and smoking  Plan:  Aim for 3 Carb Choices per meal (45 grams) +/- 1 either way  Aim for 1-2 Carbs per snack Begin reading food labels for Total Carbohydrate of foods Consider  increasing your activity level by walking or other activity daily as tolerated Begin checking BG before breakfast and 2 hours after first bite of breakfast, lunch and dinner as directed by MD  Bring Log Book to every medical appointment   Take medication as directed by MD  Blood glucose monitor given: [employer] Lot # [id number] Exp: 12/13/2018 Blood glucose reading: 253 mg/dl   Due to elevated glucose level, I spoke with Diane Day, RN who directed me to  call Dr. Irena Cords, who is on call today. He gave me a verbal order for patient to stop Glimiperide, continue with Metformin and to check BG 4 times a day. She is to make appointment to come back in 2 days with BG Log sheet so BG can be evaluated at that time. Appointment made for her to come back on 8/23 at noon to see me and bring BG Log sheet.   Patient instructed to monitor glucose levels: FBS: 60 - 95 mg/dl 2 hour: <120 mg/dl  Patient received the following handouts: in Spanish  Nutrition Diabetes and Pregnancy  Carbohydrate Counting List  Patient will be seen for follow-up in 2 days per MD order

## 2017-04-16 ENCOUNTER — Other Ambulatory Visit: Payer: Self-pay

## 2017-04-16 ENCOUNTER — Encounter: Payer: Self-pay | Admitting: *Deleted

## 2017-04-16 ENCOUNTER — Ambulatory Visit: Payer: Self-pay | Admitting: *Deleted

## 2017-04-16 DIAGNOSIS — O24111 Pre-existing diabetes mellitus, type 2, in pregnancy, first trimester: Principal | ICD-10-CM

## 2017-04-16 DIAGNOSIS — E1165 Type 2 diabetes mellitus with hyperglycemia: Secondary | ICD-10-CM

## 2017-04-16 MED ORDER — GLYBURIDE 2.5 MG PO TABS: 2.5000 mg | ORAL_TABLET | Freq: Two times a day (BID) | ORAL | 3 refills | Status: DC

## 2017-04-16 NOTE — Progress Notes (Signed)
  Patient was seen on 04/16/2017 for Type 2 Diabetes and pregnancy self-management follow up visit. EDD: 09/27/2017, today is at 16 weeks 0 days. Patient speaks Spanish, is here with Interpretor Patient was instructed on 04/14/17 to start checking BG 4 times a day and to return to see me in 2 days (today) for review of BG information, which she did. BG Log Book indicate:  FBG Post Breakfast Post Lunch Post Supper 8/21  253   175  212 8/22 176 161   171  203  8/23 181 179  I explained to patient that I would provide this information to MD this afternoon and if a Rx was ordered, she would be notified to pick it up and start taking. She agreed that she understood.   I called Dr. Scheryl Darter, who she has an appointment with next Thursday, and he gave me a verbal order to start Glyburide @ 2.5 mg twice a day. I provided this information to be called into patient's pharmacy today.  Diet history indicates 3 meals a day with protein and no more than 2 servings of carbohydrate at each meal. Beverage is completely water. Snacks include nuts, raw vegetables and meat. She states she walks throughout the day in her home but some pain in her injured leg. I reviewed arm chair exercises with her and encouraged her to be active after her meals to help improve BG.  Plan:  Aim for 2-3 Carb Choices per meal (45 grams)  Aim for 1-2 Carbs per snack Begin reading food labels for Total Carbohydrate of foods Consider  increasing your activity level by walking or arm chair exercises daily as tolerated Contiue checking BG before breakfast and 2 hours after first bite of breakfast, lunch and dinner as directed by MD  Bring Log Book to every medical appointment   Take medication as directed by MD  Patient instructed to monitor glucose levels: FBS: 60 - 95 mg/dl 2 hour: <300 mg/dl  Patient received the following handouts: in Spanish  No new handouts today.  Patient will be seen for follow-up in 1 week with MD here at  [facility]

## 2017-04-16 NOTE — Telephone Encounter (Signed)
Per Dr.Arnold and Meriam Sprague patient will need to start glyburide 2.5 bid. Called into [facility].

## 2017-04-23 ENCOUNTER — Encounter: Payer: Self-pay | Admitting: Obstetrics & Gynecology

## 2017-04-23 ENCOUNTER — Ambulatory Visit (INDEPENDENT_AMBULATORY_CARE_PROVIDER_SITE_OTHER): Payer: Self-pay | Admitting: Obstetrics & Gynecology

## 2017-04-23 DIAGNOSIS — O0992 Supervision of high risk pregnancy, unspecified, second trimester: Secondary | ICD-10-CM

## 2017-04-23 DIAGNOSIS — O24912 Unspecified diabetes mellitus in pregnancy, second trimester: Secondary | ICD-10-CM

## 2017-04-23 DIAGNOSIS — O0990 Supervision of high risk pregnancy, unspecified, unspecified trimester: Secondary | ICD-10-CM | POA: Insufficient documentation

## 2017-04-23 DIAGNOSIS — O24919 Unspecified diabetes mellitus in pregnancy, unspecified trimester: Secondary | ICD-10-CM | POA: Insufficient documentation

## 2017-04-23 LAB — POCT URINALYSIS DIP (DEVICE)
Bilirubin Urine: NEGATIVE
GLUCOSE, UA: NEGATIVE mg/dL
Hgb urine dipstick: NEGATIVE
KETONES UR: NEGATIVE mg/dL
Leukocytes, UA: NEGATIVE
Nitrite: NEGATIVE
PH: 5.5 (ref 5.0–8.0)
PROTEIN: NEGATIVE mg/dL
SPECIFIC GRAVITY, URINE: 1.01 (ref 1.005–1.030)
UROBILINOGEN UA: 1 mg/dL (ref 0.0–1.0)

## 2017-04-23 MED ORDER — ASPIRIN EC 81 MG PO TBEC: 81.0000 mg | DELAYED_RELEASE_TABLET | Freq: Every day | ORAL | 6 refills | Status: DC

## 2017-04-23 MED ORDER — GLYBURIDE 2.5 MG PO TABS: 5.0000 mg | ORAL_TABLET | Freq: Two times a day (BID) | ORAL | 3 refills | Status: DC

## 2017-04-23 NOTE — Patient Instructions (Signed)
Diagnstico de diabetes mellitus gestacional  (Gestational Diabetes Mellitus, Diagnosis)  La diabetes gestacional (diabetes mellitus gestacional) es una forma de diabetes a corto plazo (temporal) que puede aparecer durante el embarazo. Este cuadro desaparece despus del parto. Puede deberse a uno de estos problemas o a ambos:   El cuerpo no produce la cantidad suficiente de una hormona llamada insulina.   El cuerpo no responde de forma normal a la insulina que produce.  La insulina permite que los ciertos azcares (glucosa) ingresen a las clulas del cuerpo. Esto le proporciona la energa. Cuando se tiene diabetes, la glucosa no puede ingresar a las clulas. Esto produce un aumento del nivel de glucosa en la sangre (hiperglucemia).  Si la diabetes se trata, es posible que ni usted ni el beb se vean afectados. El mdico fijar los objetivos del tratamiento para usted. Generalmente, los resultados de la glucemia deben ser los siguientes:   Despus de no comer durante mucho tiempo (ayunar): 95mg/dl (5,3mmol/l).   Despus de las comidas (posprandial):  ? Una hora despus de una comida: igual o menor que 140mg/dl (7,8mmol/l).  ? Dos horas despus de una comida: igual o menor que 120mg/dl (6,7mmol/l).   Nivel de A1c (hemoglobinaA1c): del 6% al 6,5%.  CUIDADOS EN EL HOGAR  Preguntas para hacerle al mdico  Puede hacer las siguientes preguntas:   Debo reunirme con un instructor para el cuidado de la diabetes?   Dnde puedo encontrar un grupo de apoyo para personas diabticas?   Qu equipos necesitar para cuidarme en casa?   Qu medicamentos para la diabetes necesito? Cundo debo tomarlos?   Con qu frecuencia debo controlarme el nivel de glucosa en la sangre?   A qu nmero puedo llamar si tengo preguntas?   Cundo es la prxima cita con el mdico?  Instrucciones generales   Tome los medicamentos de venta libre y los recetados solamente como se lo haya indicado el mdico.   Mantenga un peso  saludable durante el embarazo.   Concurra a todas las visitas de control como se lo haya indicado el mdico. Esto es importante.  SOLICITE AYUDA SI:   Su nivel de glucosa en la sangre es igual o superior a 240mg/dl (13,3mmol/dl).   Su nivel de glucosa en la sangre es igual o superior a 200mg/dl (11,1mmol/l), y tiene cetonas en la orina.   Ha estado enferma o ha tenido fiebre durante 2o ms das y no mejora.   Si tiene alguno de estos problemas durante ms de 6horas:  ? No puede comer ni beber.  ? Siente malestar estomacal (nuseas).  ? Vomita.  ? La materia fecal es lquida (diarrea).  SOLICITE AYUDA DE INMEDIATO SI:   El nivel de glucosa en la sangre est por debajo de 54mg/dl (3mmol/l).   Est confundida.   Tiene dificultad para hacer lo siguiente:  ? Pensar con claridad.  ? Respirar.   El beb se mueve menos de lo normal.   Tiene los siguientes sntomas:  ? Niveles moderados o altos de cetonas en la orina.  ? Hemorragia vaginal.  ? Secrecin de un lquido fuera de lo comn de la vagina.  ? Contracciones prematuras. Estas pueden causar una sensacin de opresin en el vientre.  Esta informacin no tiene como fin reemplazar el consejo del mdico. Asegrese de hacerle al mdico cualquier pregunta que tenga.  Document Released: 12/03/2015 Document Revised: 12/03/2015 Document Reviewed: 09/14/2015  Elsevier Interactive Patient Education  2018 Elsevier Inc[employer]

## 2017-04-23 NOTE — Progress Notes (Signed)
  Subjective:    Michele Morales is a [id number] 7426w4d being seen today for her first obstetrical visit.  Her obstetrical history is significant for advanced maternal age and diabetes [facility]. Patient does intend to breast feed. Pregnancy history fully reviewed.  Patient reports no complaints.  Vitals:   04/23/17 1327  BP: 119/77  Pulse: (!) 108  Weight: 126 lb 3.2 oz (57.2 kg)    HISTORY: OB History  Gravida Para Term Preterm AB Living  7 6 6     6   SAB TAB Ectopic Multiple Live Births          6    # Outcome Date GA Lbr Len/2nd Weight Sex Delivery Anes PTL Lv  7 Current           6 Term 11/19/08 6929w0d   M Vag-Spont   LIV  5 Term 11/18/04    F Vag-Spont   LIV  4 Term 04/12/03    M Vag-Spont   LIV  3 Term 11/04/00    F Vag-Spont   LIV  2 Term 05/01/98    F Vag-Spont   LIV  1 Term 03/11/95    F Vag-Spont   LIV     Past Medical History:  Diagnosis Date  . Diabetes mellitus without complication (HCC)   . Hypercholesteremia    History reviewed. No pertinent surgical history. Family History  Problem Relation Age of Onset  . Diabetes Mother      Exam    Uterus:     Pelvic Exam:                                    Skin: normal coloration and turgor, no rashes    Neurologic: oriented, normal mood   Extremities: normal strength, tone, and muscle mass, left knee immobilized due to patella fx   HEENT PERRLA, extra ocular movement intact and sclera clear, anicteric   Mouth/Teeth dental hygiene good   Neck supple   Cardiovascular: regular rate and rhythm   Respiratory:  appears well, vitals normal, no respiratory distress, acyanotic, normal RR   Abdomen: 18 week sized   Urinary:        Assessment:    Pregnancy: [id number] Patient Active Problem List   Diagnosis Date Noted  . Supervision of high risk pregnancy, antepartum 04/23/2017  . Diabetes mellitus affecting pregnancy, antepartum 04/23/2017        Plan:     Initial labs drawn. Prenatal  vitamins. Problem list reviewed and updated. Genetic Screening discussedundecided.  Ultrasound discussed; fetal survey: ordered.  Follow up in 2 weeks. 50% of 40 min visit spent on counseling and coordination of care.  Needs DM mgmnt, increased her glyburide and will need to start insulin next weekASA 81 mg Will need fetal echo and optho appt   Michele Morales 04/23/2017

## 2017-04-24 ENCOUNTER — Encounter: Payer: Self-pay | Admitting: *Deleted

## 2017-04-28 ENCOUNTER — Ambulatory Visit: Payer: Self-pay | Admitting: *Deleted

## 2017-04-28 ENCOUNTER — Encounter: Payer: Self-pay | Attending: Obstetrics and Gynecology | Admitting: *Deleted

## 2017-04-28 DIAGNOSIS — Z3A00 Weeks of gestation of pregnancy not specified: Secondary | ICD-10-CM | POA: Insufficient documentation

## 2017-04-28 DIAGNOSIS — O24919 Unspecified diabetes mellitus in pregnancy, unspecified trimester: Secondary | ICD-10-CM | POA: Insufficient documentation

## 2017-04-28 MED ORDER — INSULIN NPH (HUMAN) (ISOPHANE) 100 UNIT/ML ~~LOC~~ SUSP: SUBCUTANEOUS | 3 refills | Status: DC

## 2017-04-28 MED ORDER — INSULIN SYRINGES (DISPOSABLE) U-100 0.5 ML MISC: 1.0000 | Freq: Three times a day (TID) | 11 refills | Status: DC

## 2017-04-28 MED ORDER — INSULIN REGULAR HUMAN 100 UNIT/ML IJ SOLN: INTRAMUSCULAR | 11 refills | Status: DC

## 2017-04-29 NOTE — Progress Notes (Signed)
  Patient was seen on 04/28/2017 for Type 2 Diabetes and pregnancy self-management follow up visit. EDD: 09/27/2017, today is at 18 weeks 2 days. Patient speaks Spanish, is here with grown daughter, Rubin Payordith, who she verbally agrees to interpret today.  Patient returns today after increasing Glyburide to 5 mg twice a day per MD order last week. BG Log Book indicate:  FBG in 180-225 mg/dl range and all post meal BG out of range as well. Patient will now need insulin.    I called Dr. Ashok PallWouk, the On Call MD, and he agreed to recommended insulin plan of:  Pre breakfast: 18 units NPH mixed with 6 units Regular  Pre supper:  6 units Regular  Pre bedtime: 6 units NPH Determined by formula of Weight in kg X 0.7 for 1st trimester = 36 units TDD AM Dose = 2/3 TDD with 2/3 of that from NPH and 1/3 from Regular  18 units NPH and 6 units Regular Evening doses = 1/3 TDD with 1/2 from Regular before supper = 6 units Regular  and 1/2 from NPH at bedtime = 6 units NPH  Patient taught to mix NPH and Regular and showed her understanding with return demonstration.  Patient taught about syringe size of 50 units as well as gauge of needle, and appropriate disposal.  Reviewed symptoms of hypoglycemia and treatment with 15 grams fast acting carbohydrate.   Patient instructed to stop taking Glyburide once she starts on insulin per verbal order from Dr. Ashok PallWouk.   Cost of insulin was investigated and [facility] [address] [place], [place] per patient request, using ReliOn insulin.   Plan:  Aim for 2-3 Carb Choices per meal (45 grams)  Aim for 1-2 Carbs per snack Begin reading food labels for Total Carbohydrate of foods Consider  increasing your activity level by walking or arm chair exercises daily as tolerated Contiue checking BG before breakfast and 2 hours after first bite of breakfast, lunch and dinner as directed by MD  Bring Log Book to every medical appointment   Take medication as  directed by MD  Patient instructed to monitor glucose levels: FBS: 60 - 95 mg/dl 2 hour: <161<120 mg/dl  Patient received the following handouts:   How to Mix Insulin  Patient will be seen for follow-up in 1 week with MD here at [facility] and with me in 2 weeks.

## 2017-04-29 NOTE — Progress Notes (Signed)
Per Bev Paddock, pt now requires insulin for blood glucose control. She has consulted with Dr. Ashok PallWouk and orders received.  Pt given instruction today for insulin administration by Bev. Rx for insulin and syringes e-prescribed to pt's preferred pharmacy.

## 2017-05-07 ENCOUNTER — Ambulatory Visit (INDEPENDENT_AMBULATORY_CARE_PROVIDER_SITE_OTHER): Payer: Self-pay | Admitting: Obstetrics and Gynecology

## 2017-05-07 ENCOUNTER — Encounter: Payer: Self-pay | Admitting: Obstetrics & Gynecology

## 2017-05-07 VITALS — BP 125/75 | HR 112 | Wt 131.1 lb

## 2017-05-07 DIAGNOSIS — Z789 Other specified health status: Secondary | ICD-10-CM

## 2017-05-07 DIAGNOSIS — O24112 Pre-existing diabetes mellitus, type 2, in pregnancy, second trimester: Secondary | ICD-10-CM

## 2017-05-07 DIAGNOSIS — O09521 Supervision of elderly multigravida, first trimester: Secondary | ICD-10-CM

## 2017-05-07 DIAGNOSIS — O0990 Supervision of high risk pregnancy, unspecified, unspecified trimester: Secondary | ICD-10-CM

## 2017-05-07 DIAGNOSIS — O0992 Supervision of high risk pregnancy, unspecified, second trimester: Secondary | ICD-10-CM

## 2017-05-07 DIAGNOSIS — O09522 Supervision of elderly multigravida, second trimester: Secondary | ICD-10-CM | POA: Insufficient documentation

## 2017-05-07 MED ORDER — ASPIRIN EC 81 MG PO TBEC: 81.0000 mg | DELAYED_RELEASE_TABLET | Freq: Every day | ORAL | 6 refills | Status: DC

## 2017-05-07 MED ORDER — INSULIN REGULAR HUMAN 100 UNIT/ML IJ SOLN: INTRAMUSCULAR | 11 refills | Status: DC

## 2017-05-07 MED ORDER — INSULIN NPH (HUMAN) (ISOPHANE) 100 UNIT/ML ~~LOC~~ SUSP: SUBCUTANEOUS | 3 refills | Status: DC

## 2017-05-07 NOTE — Progress Notes (Signed)
Appt scheduled with Dr.Cotton on // :00. Patient has been given this information.

## 2017-05-07 NOTE — Progress Notes (Signed)
Prenatal Visit Note Date: 05/07/2017 Clinic: [facility]  Subjective:  Michele Morales is a 41 y.o. [id number] at 26108w4d being seen today for ongoing prenatal care.  She is currently monitored for the following issues for this high-risk pregnancy and has Supervision of high risk pregnancy, antepartum; Diabetes mellitus affecting pregnancy, antepartum; AMA (advanced maternal age) multigravida 35+, first trimester; and Language barrier on her problem list.  Patient reports no complaints.   Contractions: Not present. Vag. Bleeding: None.  Movement: Present. Denies leaking of fluid.   The following portions of the patient's history were reviewed and updated as appropriate: allergies, current medications, past family history, past medical history, past social history, past surgical history and problem list. Problem list updated.  Objective:   Vitals:   05/07/17 1058  BP: 125/75  Pulse: (!) 112  Weight: 131 lb 1.6 oz (59.5 kg)    Fetal Status: Fetal Heart Rate (bpm): 150   Movement: Present     General:  Alert, oriented and cooperative. Patient is in no acute distress.  Skin: Skin is warm and dry. No rash noted.   Cardiovascular: Normal heart rate noted  Respiratory: Normal respiratory effort, no problems with respiration noted  Abdomen: Soft, gravid, appropriate for gestational age. Pain/Pressure: Present     Pelvic:  Cervical exam deferred        Extremities: Normal range of motion.  Edema: None  Mental Status: Normal mood and affect. Normal behavior. Normal judgment and thought content.   Urinalysis:      Assessment and Plan:  Pregnancy: G7P6006 at 58108w4d  1. AMA (advanced maternal age) multigravida 35+, first trimester - AMB MFM GENETICS REFERRAL  2. Pre-existing type 2 diabetes mellitus during pregnancy in second trimester Patient on NPH 18/6 and regular 6/6, which she started on 9/5. BS values are all abnormal. AM fastings: 130s-190s 2hr PP:  90s-180s  Insulin adjusted to NPH 26/10 and regular 10/10. Will check a1c and baseline pre-x labs today. Pt also scheduled for fetal echo.  - Ambulatory referral to Pediatric Cardiology  3. Supervision of high risk pregnancy, antepartum Pt advised to start baby ASA. Request sent to clinical pool to get [facility]. Pt states she [facility] [place] [facility] today. Will still get mfm anatomy u/s.  - aspirin EC 81 MG tablet; Take 1 tablet (81 mg total) by mouth daily.  Dispense: 30 tablet; Refill: 6  Preterm labor symptoms and general obstetric precautions including but not limited to vaginal bleeding, contractions, leaking of fluid and fetal movement were reviewed in detail with the patient. Please refer to After Visit Summary for other counseling recommendations.  Return in about 1 week (around 05/14/2017).   [place] BingPickens, Mathilda Maguire, MD

## 2017-05-08 ENCOUNTER — Encounter: Payer: Self-pay | Admitting: Obstetrics and Gynecology

## 2017-05-08 DIAGNOSIS — O1212 Gestational proteinuria, second trimester: Secondary | ICD-10-CM | POA: Insufficient documentation

## 2017-05-08 LAB — CMP AND LIVER
ALK PHOS: 71 IU/L (ref 39–117)
ALT: 15 IU/L (ref 0–32)
AST: 14 IU/L (ref 0–40)
Albumin: 3.6 g/dL (ref 3.5–5.5)
BILIRUBIN, DIRECT: 0.07 mg/dL (ref 0.00–0.40)
BUN: 10 mg/dL (ref 6–24)
Bilirubin Total: 0.2 mg/dL (ref 0.0–1.2)
CO2: 20 mmol/L (ref 20–29)
Calcium: 8.7 mg/dL (ref 8.7–10.2)
Chloride: 104 mmol/L (ref 96–106)
Creatinine, Ser: 0.42 mg/dL — ABNORMAL LOW (ref 0.57–1.00)
GFR calc Af Amer: 147 mL/min/{1.73_m2} (ref >59)
GFR calc non Af Amer: 128 mL/min/{1.73_m2} (ref >59)
Glucose: 130 mg/dL — ABNORMAL HIGH (ref 65–99)
Potassium: 4.1 mmol/L (ref 3.5–5.2)
SODIUM: 139 mmol/L (ref 134–144)
TOTAL PROTEIN: 6.2 g/dL (ref 6.0–8.5)

## 2017-05-08 LAB — HEMOGLOBIN A1C
ESTIMATED AVERAGE GLUCOSE: 192 mg/dL
HEMOGLOBIN A1C: 8.3 % — AB (ref 4.8–5.6)

## 2017-05-08 LAB — PROTEIN / CREATININE RATIO, URINE
CREATININE, UR: 37.4 mg/dL
PROTEIN/CREAT RATIO: 305 mg/g{creat} — AB (ref 0–200)
Protein, Ur: 11.4 mg/dL

## 2017-05-08 LAB — TSH: TSH: 1.2 u[IU]/mL (ref 0.450–4.500)

## 2017-05-13 ENCOUNTER — Ambulatory Visit (INDEPENDENT_AMBULATORY_CARE_PROVIDER_SITE_OTHER): Payer: Self-pay | Admitting: Obstetrics and Gynecology

## 2017-05-13 VITALS — BP 120/73 | HR 109 | Wt 138.2 lb

## 2017-05-13 DIAGNOSIS — O09521 Supervision of elderly multigravida, first trimester: Secondary | ICD-10-CM

## 2017-05-13 DIAGNOSIS — O24919 Unspecified diabetes mellitus in pregnancy, unspecified trimester: Secondary | ICD-10-CM

## 2017-05-13 DIAGNOSIS — Z789 Other specified health status: Secondary | ICD-10-CM

## 2017-05-13 DIAGNOSIS — O0992 Supervision of high risk pregnancy, unspecified, second trimester: Secondary | ICD-10-CM

## 2017-05-13 DIAGNOSIS — O1212 Gestational proteinuria, second trimester: Secondary | ICD-10-CM

## 2017-05-13 DIAGNOSIS — O0990 Supervision of high risk pregnancy, unspecified, unspecified trimester: Secondary | ICD-10-CM

## 2017-05-13 DIAGNOSIS — O09522 Supervision of elderly multigravida, second trimester: Secondary | ICD-10-CM

## 2017-05-13 DIAGNOSIS — O24912 Unspecified diabetes mellitus in pregnancy, second trimester: Secondary | ICD-10-CM

## 2017-05-13 MED ORDER — INSULIN NPH (HUMAN) (ISOPHANE) 100 UNIT/ML ~~LOC~~ SUSP: SUBCUTANEOUS | 3 refills | Status: DC

## 2017-05-13 NOTE — Progress Notes (Signed)
[employer] interpreter Onalee Hua [phone]

## 2017-05-13 NOTE — Progress Notes (Signed)
Prenatal Visit Note Date: 05/13/2017 Clinic: [facility]  Subjective:  Michele Morales is a 41 y.o. [id number] at [redacted]w[redacted]d being seen today for ongoing prenatal care.  She is currently monitored for the following issues for this high-risk pregnancy and has Supervision of high risk pregnancy, antepartum; Diabetes mellitus affecting pregnancy, antepartum; AMA (advanced maternal age) multigravida 35+, first trimester; Language barrier; and Proteinuria affecting pregnancy in second trimester on her problem list.  Patient reports no complaints.   Contractions: Not present. Vag. Bleeding: None.  Movement: Present. Denies leaking of fluid.   The following portions of the patient's history were reviewed and updated as appropriate: allergies, current medications, past family history, past medical history, past social history, past surgical history and problem list. Problem list updated.  Objective:   Vitals:   05/13/17 1254  BP: 120/73  Pulse: (!) 109  Weight: 62.7 kg (138 lb 3.2 oz)    Fetal Status: Fetal Heart Rate (bpm): 150s   Movement: Present     General:  Alert, oriented and cooperative. Patient is in no acute distress.  Skin: Skin is warm and dry. No rash noted.   Cardiovascular: Normal heart rate noted  Respiratory: Normal respiratory effort, no problems with respiration noted  Abdomen: Soft, gravid, appropriate for gestational age. Pain/Pressure: Present     Pelvic:  Cervical exam deferred        Extremities: Normal range of motion.  Edema: Trace  Mental Status: Normal mood and affect. Normal behavior. Normal judgment and thought content.   Urinalysis:      Assessment and Plan:  Pregnancy: G7P6006 at [redacted]w[redacted]d  1. Diabetes mellitus affecting pregnancy, antepartum On nph 26/10 and regular 10/10. Pt forgot log book but states am fastings are 130-140s and 2h PP in the 80s-90s. Will increase qhs nph to 12. Has fetal echo and anatomy u/s already scheduled  2.  Supervision of high risk pregnancy, antepartum See above  3. AMA (advanced maternal age) multigravida 35+, first trimester To see GC at anatomy u/s appt  4. Language barrier Interpreter used  5. Proteinuria affecting pregnancy in second trimester Info and equipment given for 24h collection given to patient.   Preterm labor symptoms and general obstetric precautions including but not limited to vaginal bleeding, contractions, leaking of fluid and fetal movement were reviewed in detail with the patient. Please refer to After Visit Summary for other counseling recommendations.  Return in about 2 weeks (around 05/27/2017) for 2-2.5wk rob.   [place] Bing, MD

## 2017-05-14 ENCOUNTER — Encounter: Payer: Self-pay | Admitting: *Deleted

## 2017-05-14 ENCOUNTER — Ambulatory Visit: Payer: Self-pay | Admitting: *Deleted

## 2017-05-14 DIAGNOSIS — O24112 Pre-existing diabetes mellitus, type 2, in pregnancy, second trimester: Principal | ICD-10-CM

## 2017-05-14 DIAGNOSIS — E1165 Type 2 diabetes mellitus with hyperglycemia: Secondary | ICD-10-CM

## 2017-05-14 NOTE — Progress Notes (Signed)
  Patient was seen on 05/14/2017 for Type 2 Diabetes and pregnancy self-management follow up visit. EDD: 09/27/2017, today is at 20 weeks 4 days. Patient speaks Spanish, is here Engineer, technical sales.  Patient states she is doing well since starting on insulin. Evening NPH dose was increased yesterday per MD notes and FBG today was better at 84 mg/dl.   Current insulin is NPH @ 26/12 Regular @ 10/10  Patient states she does have symptoms of hypoglycemia usually when she is active in the morning time. She treats with sweet cereal and milk and feels better. I reviewed causes, symptoms and treatment of hypoglycemia with her and showed her the information in her Spanish Diabetes Packet. I recommended an extra carb choice at any meal prior to when she plans to exercise and she expressed good verbal understanding. I also suggested 4 oz fruit juice or sweet tea in place of sweet cereal to treat low BG  Plan:  Continue to aim for 2-3 Carb Choices per meal (45 grams)  Aim for 1-2 Carbs per snack Continue reading food labels for Total Carbohydrate of foods Consider  increasing your activity level by walking or arm chair exercises daily as tolerated Contiue checking BG before breakfast and 2 hours after first bite of breakfast, lunch and dinner as directed by MD  Bring Log Book to every medical appointment   Take medication as directed by MD  She needed supplies for her True Track meter, I provided 2 boxes of strips and 1 box of Lancets  Patient instructed to monitor glucose levels: FBS: 60 - 95 mg/dl 2 hour: <161 mg/dl  Patient received the following handouts:   No new handouts today  Patient will be seen in one month for follow up with me.Marland Kitchen

## 2017-05-19 ENCOUNTER — Ambulatory Visit (HOSPITAL_COMMUNITY)
Admission: RE | Admit: 2017-05-19 | Discharge: 2017-05-19 | Disposition: A | Discharge status: Home / Self Care | Payer: Self-pay | Source: Ambulatory Visit | Attending: Obstetrics and Gynecology | Admitting: Obstetrics and Gynecology

## 2017-05-19 ENCOUNTER — Other Ambulatory Visit (HOSPITAL_COMMUNITY): Payer: Self-pay | Admitting: *Deleted

## 2017-05-19 DIAGNOSIS — Z315 Encounter for genetic counseling: Secondary | ICD-10-CM | POA: Insufficient documentation

## 2017-05-19 DIAGNOSIS — Z3A21 21 weeks gestation of pregnancy: Secondary | ICD-10-CM | POA: Insufficient documentation

## 2017-05-19 DIAGNOSIS — O09521 Supervision of elderly multigravida, first trimester: Secondary | ICD-10-CM

## 2017-05-19 DIAGNOSIS — O09292 Supervision of pregnancy with other poor reproductive or obstetric history, second trimester: Secondary | ICD-10-CM | POA: Insufficient documentation

## 2017-05-19 DIAGNOSIS — Z3689 Encounter for other specified antenatal screening: Secondary | ICD-10-CM | POA: Insufficient documentation

## 2017-05-19 DIAGNOSIS — O09522 Supervision of elderly multigravida, second trimester: Secondary | ICD-10-CM | POA: Insufficient documentation

## 2017-05-19 DIAGNOSIS — O24112 Pre-existing diabetes mellitus, type 2, in pregnancy, second trimester: Secondary | ICD-10-CM | POA: Insufficient documentation

## 2017-05-19 DIAGNOSIS — O09529 Supervision of elderly multigravida, unspecified trimester: Secondary | ICD-10-CM

## 2017-05-19 DIAGNOSIS — O289 Unspecified abnormal findings on antenatal screening of mother: Secondary | ICD-10-CM | POA: Insufficient documentation

## 2017-05-19 NOTE — Progress Notes (Signed)
DOB: 1976-08-20 Referring Provider: [place] Bing, MD Appointment Date: 05/19/2017 Attending: Dr. Berna Spare  [name] and her husband, Mr. Michele Morales, were seen for genetic counseling regarding a maternal age of 41 y.o. and an increased risk for Down syndrome based on a maternal serum Quad screen.  Michele Morales provided in person Spanish/English translation.   In summary:  Discussed AMA and associated risk for fetal aneuploidy  Reviewed results of screening  Down syndrome risk decreased for age, but still at risk  Trisomy 44 risk decreased  Not screening for other fetal aneuploidy  Discussed options for additional screening  NIPS - declined  Ultrasound - performed today  Discussed diagnostic testing options  Amniocentesis - declined  Reviewed family history concerns  Advanced paternal age  Discussed carrier screening options - declined  CF  SMA  Hemoglobinopathies  They were counseled regarding maternal age and the association with risk for chromosome conditions due to nondisjunction with aging of the ova.   We reviewed chromosomes, nondisjunction, and the associated 1 in 16 risk for fetal aneuploidy related to a maternal age of 41 y.o. at [redacted]w[redacted]d gestation.  They were counseled that the risk for aneuploidy decreases as gestational age increases, accounting for those pregnancies which spontaneously abort.  We specifically discussed Down syndrome (trisomy 90), trisomies 31 and 80, and sex chromosome aneuploidies (47,XXX and 47,XXY) including the common features and prognoses of each.   We also reviewed [name]'s maternal serum Quad screen result and the associated decrease in risk for fetal Down syndrome (but still above the screen cut-off) from 1 in 83 to 1 in 141.  They were counseled regarding other explanations for a screen positive result including normal variation and differences in maternal metabolism.  In addition, we reviewed the screen  adjusted reduction in risks for trisomy 18 (1 in 251 to 1 in 620) and ONTDs.  They understand that Quad screening provides a pregnancy specific risk for fetal aneuploidy, but is not considered to be diagnostic.    We discussed that ~50-80% of fetuses with Down syndrome and up to 90-95% of fetuses with trisomy 18/13, when well visualized, have detectable anomalies or soft markers by detailed ultrasound (~18+ weeks gestation).  A complete ultrasound was performed today.  The ultrasound report will be sent under separate cover. They understand that the ultrasound today did not identify any fetal anomalies or soft markers of aneuploidy.  We reviewed the option of noninvasive prenatal screening (NIPS).  Specifically, we discussed that NIPS analyzes cell free DNA found in the maternal circulation. This test is not diagnostic for chromosome conditions, but can provide information regarding the presence or absence of extra fetal DNA for chromosomes 13, 18, 21, X, and Y, and missing fetal DNA for chromosome X and Y.   This couple was also counseled regarding diagnostic testing via amniocentesis.  We reviewed the approximate 1 in 300-500 risk for complications, including spontaneous pregnancy loss.  We discussed the possible results that the tests might provide including: positive, negative, unanticipated, and no result. Finally, they were counseled regarding the cost of each option and potential out of pocket expenses.  Additional screening and diagnostic testing were declined today.  They understand that screening tests cannot rule out all birth defects or genetic syndromes. The patient was advised of this limitation and states she still does not want additional testing or screening at this time.   [name] was provided with written information regarding cystic fibrosis (CF), spinal muscular atrophy (SMA)  and hemoglobinopathies including the carrier frequency, availability of carrier screening and prenatal  diagnosis if indicated.  In addition, we discussed that CF and hemoglobinopathies are routinely screened for as part of the [place] newborn screening panel.  After further discussion, she declined screening for CF, SMA and hemoglobinopathies.  Both family histories were reviewed.  Mr. Michele Morales reported that he is 41 years of age.  We discussed that advanced paternal age is defined as paternal age greater than or equal to age 14.  Recent large-scale sequencing studies have shown that approximately 80% of de novo point mutations are of paternal origin.  Many studies have demonstrated a strong correlation between increased paternal age and de novo point mutations.  It is estimated that the overall chance for a de novo mutation is ~0.5%.  We also discussed the wide range of conditions which can be caused by new dominant gene mutations.  They were counseled that genetic testing for each individual single gene condition is not warranted or available unless ultrasound or concerns lend suspicion to a specific condition.    They were also counseled that some literature suggests that APA is also associated with an increase in risk for fetal aneuploidy.  While other literature does not support this, we discussed that a specific risk for aneuploidy other than that based on maternal age cannot be quantified. Lastly, we discussed that newer literature suggests that the risk for autism spectrum disorders (ASD) may be increased in children born to fathers of APA.  We discussed that ASDs are among the most common neurodevelopmental disorders, with approximately 1 in 85 children meeting criteria for ASD.  Approximately 80% of individuals diagnosed are female.  There is strong evidence that genetic factors play a critical role in development of ASD.  While there have been recent advances in identifying specific genetic causes of ASD, there are still many individuals for whom the etiology of the ASD is not known.  They understand that at  this time there is no reliable, comprehensive genetic testing available for ASD and a specific paternal age related risk can not be quantified.    The remainder of the family histories were reviewed and found to be noncontributory for birth defects, intellectual disability, and known genetic conditions. Without further information regarding the provided family history, an accurate genetic risk cannot be calculated. Further genetic counseling is warranted if more information is obtained.  [name] denied exposure to environmental toxins or chemical agents. She denied the use of alcohol, tobacco or street drugs. She denied significant viral illnesses during the course of her pregnancy. Her medical and surgical histories were noncontributory.     I counseled this couple regarding the above risks and available options.  The approximate face-to-face time with the genetic counselor was 55 minutes.    Mady Gemma, MS,  Certified Genetic Counselor

## 2017-05-20 ENCOUNTER — Other Ambulatory Visit: Payer: Self-pay

## 2017-05-22 ENCOUNTER — Other Ambulatory Visit: Payer: Self-pay

## 2017-05-28 ENCOUNTER — Ambulatory Visit (INDEPENDENT_AMBULATORY_CARE_PROVIDER_SITE_OTHER): Payer: Self-pay | Admitting: Obstetrics & Gynecology

## 2017-05-28 VITALS — BP 113/73 | HR 98 | Wt 142.6 lb

## 2017-05-28 DIAGNOSIS — O09522 Supervision of elderly multigravida, second trimester: Secondary | ICD-10-CM

## 2017-05-28 DIAGNOSIS — O0990 Supervision of high risk pregnancy, unspecified, unspecified trimester: Secondary | ICD-10-CM

## 2017-05-28 DIAGNOSIS — O24912 Unspecified diabetes mellitus in pregnancy, second trimester: Secondary | ICD-10-CM

## 2017-05-28 DIAGNOSIS — O0992 Supervision of high risk pregnancy, unspecified, second trimester: Secondary | ICD-10-CM

## 2017-05-28 DIAGNOSIS — O24919 Unspecified diabetes mellitus in pregnancy, unspecified trimester: Secondary | ICD-10-CM

## 2017-05-28 LAB — POCT URINALYSIS DIP (DEVICE)
Bilirubin Urine: NEGATIVE
GLUCOSE, UA: 100 mg/dL — AB
HGB URINE DIPSTICK: NEGATIVE
Ketones, ur: NEGATIVE mg/dL
NITRITE: NEGATIVE
Protein, ur: NEGATIVE mg/dL
Specific Gravity, Urine: 1.01 (ref 1.005–1.030)
UROBILINOGEN UA: 0.2 mg/dL (ref 0.0–1.0)
pH: 6.5 (ref 5.0–8.0)

## 2017-05-28 MED ORDER — INSULIN NPH (HUMAN) (ISOPHANE) 100 UNIT/ML ~~LOC~~ SUSP: SUBCUTANEOUS | 3 refills | Status: DC

## 2017-05-28 NOTE — Progress Notes (Signed)
Declines flu vaccine.

## 2017-05-28 NOTE — Patient Instructions (Signed)
Segundo trimestre de embarazo (Second Trimester of Pregnancy) El segundo trimestre va desde la semana13 hasta la 28, desde el cuarto hasta el sexto mes, y suele ser el momento en el que mejor se siente. En general, las nuseas matutinas han disminuido o han desaparecido completamente. Tendr ms energa y podr aumentarle el apetito. El beb por nacer (feto) se desarrolla rpidamente. Hacia el final del sexto mes, el beb mide aproximadamente 9 pulgadas (23 cm) y pesa alrededor de 1 libras (700 g). Es probable que sienta al beb moverse (dar pataditas) entre las 18 y 20 semanas del embarazo. CUIDADOS EN EL HOGAR  No fume, no consuma hierbas ni beba alcohol. No tome frmacos que el mdico no haya autorizado.  No consuma ningn producto que contenga tabaco, lo que incluye cigarrillos, tabaco de mascar o cigarrillos electrnicos. Si necesita ayuda para dejar de fumar, consulte al mdico. Puede recibir asesoramiento u otro tipo de apoyo para dejar de fumar.  Tome los medicamentos solamente como se lo haya indicado el mdico. Algunos medicamentos son seguros para tomar durante el embarazo y otros no lo son.  Haga ejercicios solamente como se lo haya indicado el mdico. Interrumpa la actividad fsica si comienza a tener calambres.  Ingiera alimentos saludables de manera regular.  Use un sostn que le brinde buen soporte si sus mamas estn sensibles.  No se d baos de inmersin en agua caliente, baos turcos ni saunas.  Colquese el cinturn de seguridad cuando conduzca.  No coma carne cruda ni queso sin cocinar; evite el contacto con las bandejas sanitarias de los gatos y la tierra que estos animales usan.  Tome las vitaminas prenatales.  Tome entre 1500 y 2000mg de calcio diariamente comenzando en la semana20 del embarazo hasta el parto.  Pruebe tomar un medicamento que la ayude a defecar (un laxante suave) si el mdico lo autoriza. Consuma ms fibra, que se encuentra en las frutas y  verduras frescas y los cereales integrales. Beba suficiente lquido para mantener el pis (orina) claro o de color amarillo plido.  Dese baos de asiento con agua tibia para aliviar el dolor o las molestias causadas por las hemorroides. Use una crema para las hemorroides si el mdico la autoriza.  Si se le hinchan las venas (venas varicosas), use medias de descanso. Levante (eleve) los pies durante 15minutos, 3 o 4veces por da. Limite el consumo de sal en su dieta.  No levante objetos pesados, use zapatos de tacones bajos y sintese derecha.  Descanse con las piernas elevadas si tiene calambres o dolor de cintura.  Visite a su dentista si no lo ha hecho durante el embarazo. Use un cepillo de cerdas suaves para cepillarse los dientes. Psese el hilo dental con suavidad.  Puede seguir manteniendo relaciones sexuales, a menos que el mdico le indique lo contrario.  Concurra a los controles mdicos.  SOLICITE AYUDA SI:  Siente mareos.  Sufre calambres o presin leves en la parte baja del vientre (abdomen).  Sufre un dolor persistente en el abdomen.  Tiene malestar estomacal (nuseas), vmitos, o tiene deposiciones acuosas (diarrea).  Advierte un olor ftido que proviene de la vagina.  Siente dolor al orinar.  SOLICITE AYUDA DE INMEDIATO SI:  Tiene fiebre.  Tiene una prdida de lquido por la vagina.  Tiene sangrado o pequeas prdidas vaginales.  Siente dolor intenso o clicos en el abdomen.  Sube o baja de peso rpidamente.  Tiene dificultades para recuperar el aliento y siente dolor en el pecho.  Sbitamente se   le hinchan mucho el rostro, las manos, los tobillos, los pies o las piernas.  No ha sentido los movimientos del beb durante una hora.  Siente un dolor de cabeza intenso que no se alivia con medicamentos.  Su visin se modifica.  Esta informacin no tiene como fin reemplazar el consejo del mdico. Asegrese de hacerle al mdico cualquier pregunta que  tenga. Document Released: 04/13/2013 Document Revised: 09/01/2014 Document Reviewed: 10/12/2012 Elsevier Interactive Patient Education  2017 [employer]  

## 2017-05-28 NOTE — Progress Notes (Signed)
   PRENATAL VISIT NOTE  Subjective:  Michele Morales is a 41 y.o. G7P6006 at [redacted]w[redacted]d being seen today for ongoing prenatal care.  She is currently monitored for the following issues for this high-risk pregnancy and has Supervision of high risk pregnancy, antepartum; Diabetes mellitus affecting pregnancy, antepartum; Advanced maternal age in multigravida, second trimester; Language barrier; Proteinuria affecting pregnancy in second trimester; and [redacted] weeks gestation of pregnancy on her problem list.  Patient reports no complaints.  Contractions: Not present. Vag. Bleeding: None.  Movement: Present. Denies leaking of fluid.   The following portions of the patient's history were reviewed and updated as appropriate: allergies, current medications, past family history, past medical history, past social history, past surgical history and problem list. Problem list updated.  Objective:   Vitals:   05/28/17 1321  BP: 113/73  Pulse: 98  Weight: 142 lb 9.6 oz (64.7 kg)    Fetal Status: Fetal Heart Rate (bpm): 140   Movement: Present     General:  Alert, oriented and cooperative. Patient is in no acute distress.  Skin: Skin is warm and dry. No rash noted.   Cardiovascular: Normal heart rate noted  Respiratory: Normal respiratory effort, no problems with respiration noted  Abdomen: Soft, gravid, appropriate for gestational age.  Pain/Pressure: Absent     Pelvic: Cervical exam deferred        Extremities: Normal range of motion.  Edema: Mild pitting, slight indentation  Mental Status:  Normal mood and affect. Normal behavior. Normal judgment and thought content.   Assessment and Plan:  Pregnancy: G7P6006 at [redacted]w[redacted]d  1. Supervision of high risk pregnancy, antepartum Anatomy US reviewed  2. Diabetes mellitus affecting pregnancy, antepartum FBS most >100, will increase NPH in PM  3. Advanced maternal age in multigravida, second trimester   Preterm labor symptoms and general obstetric precautions  including but not limited to vaginal bleeding, contractions, leaking of fluid and fetal movement were reviewed in detail with the patient. Please refer to After Visit Summary for other counseling recommendations.  Return in about 2 weeks (around 06/11/2017).   Scheryl Darter, MD

## 2017-06-11 ENCOUNTER — Ambulatory Visit (INDEPENDENT_AMBULATORY_CARE_PROVIDER_SITE_OTHER): Payer: Self-pay | Admitting: Family Medicine

## 2017-06-11 VITALS — BP 116/77 | HR 108 | Wt 146.5 lb

## 2017-06-11 DIAGNOSIS — O24919 Unspecified diabetes mellitus in pregnancy, unspecified trimester: Secondary | ICD-10-CM

## 2017-06-11 DIAGNOSIS — O24912 Unspecified diabetes mellitus in pregnancy, second trimester: Secondary | ICD-10-CM

## 2017-06-11 DIAGNOSIS — Z789 Other specified health status: Secondary | ICD-10-CM

## 2017-06-11 DIAGNOSIS — O0992 Supervision of high risk pregnancy, unspecified, second trimester: Secondary | ICD-10-CM

## 2017-06-11 DIAGNOSIS — O0990 Supervision of high risk pregnancy, unspecified, unspecified trimester: Secondary | ICD-10-CM

## 2017-06-11 DIAGNOSIS — O09522 Supervision of elderly multigravida, second trimester: Secondary | ICD-10-CM

## 2017-06-11 NOTE — Progress Notes (Signed)
Subjective:  Michele FlightMaria Morales is a 41 y.o. G7P6006 at 111w4d being seen today for ongoing prenatal care.  She is currently monitored for the following issues for this high-risk pregnancy and has Supervision of high risk pregnancy, antepartum; Diabetes mellitus affecting pregnancy, antepartum; Advanced maternal age in multigravida, second trimester; Language barrier; Proteinuria affecting pregnancy in second trimester; and [redacted] weeks gestation of pregnancy on her problem list.  GDM: Patient taking NPH 26/16, regular 13 units with breakfast and 10 with dinner.  Reports no hypoglycemic episodes.  Tolerating medication well Fasting: 73-105 (3 elevated) 2hr PP: 73-130 (8 elevated)  Patient reports no complaints.  Contractions: Not present. Vag. Bleeding: None.  Movement: Present. Denies leaking of fluid.   The following portions of the patient's history were reviewed and updated as appropriate: allergies, current medications, past family history, past medical history, past social history, past surgical history and problem list. Problem list updated.  Objective:   Vitals:   06/11/17 1613  BP: 116/77  Pulse: (!) 108  Weight: 146 lb 8 oz (66.5 kg)    Fetal Status: Fetal Heart Rate (bpm): 145   Movement: Present     General:  Alert, oriented and cooperative. Patient is in no acute distress.  Skin: Skin is warm and dry. No rash noted.   Cardiovascular: Normal heart rate noted  Respiratory: Normal respiratory effort, no problems with respiration noted  Abdomen: Soft, gravid, appropriate for gestational age. Pain/Pressure: Absent     Pelvic: Vag. Bleeding: None     Cervical exam deferred        Extremities: Normal range of motion.  Edema: Mild pitting, slight indentation  Mental Status: Normal mood and affect. Normal behavior. Normal judgment and thought content.   Urinalysis:      Assessment and Plan:  Pregnancy: G7P6006 at 3711w4d  1. Supervision of high risk pregnancy, antepartum FHT and FH  normal  2. Diabetes mellitus affecting pregnancy, antepartum Continue insulin. US for growth next week Echo done today, but results not available.  3. Advanced maternal age in multigravida, second trimester Genetic counseling done  4. Language barrier Interpreter used.  Preterm labor symptoms and general obstetric precautions including but not limited to vaginal bleeding, contractions, leaking of fluid and fetal movement were reviewed in detail with the patient. Please refer to After Visit Summary for other counseling recommendations.  No Follow-up on file.   Levie HeritageStinson, Rhapsody Wolven J, DO

## 2017-06-16 ENCOUNTER — Other Ambulatory Visit (HOSPITAL_COMMUNITY): Payer: Self-pay | Admitting: *Deleted

## 2017-06-16 ENCOUNTER — Encounter (HOSPITAL_COMMUNITY): Payer: Self-pay

## 2017-06-16 ENCOUNTER — Ambulatory Visit (HOSPITAL_COMMUNITY)
Admission: RE | Admit: 2017-06-16 | Discharge: 2017-06-16 | Disposition: A | Discharge status: Home / Self Care | Payer: Self-pay | Source: Ambulatory Visit | Attending: Obstetrics and Gynecology | Admitting: Obstetrics and Gynecology

## 2017-06-16 ENCOUNTER — Other Ambulatory Visit (HOSPITAL_COMMUNITY): Payer: Self-pay | Admitting: Maternal and Fetal Medicine

## 2017-06-16 DIAGNOSIS — O24414 Gestational diabetes mellitus in pregnancy, insulin controlled: Secondary | ICD-10-CM

## 2017-06-16 DIAGNOSIS — O24119 Pre-existing diabetes mellitus, type 2, in pregnancy, unspecified trimester: Secondary | ICD-10-CM

## 2017-06-16 DIAGNOSIS — O281 Abnormal biochemical finding on antenatal screening of mother: Secondary | ICD-10-CM

## 2017-06-16 DIAGNOSIS — Z3A25 25 weeks gestation of pregnancy: Secondary | ICD-10-CM

## 2017-06-16 DIAGNOSIS — Z794 Long term (current) use of insulin: Principal | ICD-10-CM

## 2017-06-16 DIAGNOSIS — O09529 Supervision of elderly multigravida, unspecified trimester: Secondary | ICD-10-CM

## 2017-06-16 DIAGNOSIS — O09299 Supervision of pregnancy with other poor reproductive or obstetric history, unspecified trimester: Secondary | ICD-10-CM | POA: Insufficient documentation

## 2017-06-16 DIAGNOSIS — O09522 Supervision of elderly multigravida, second trimester: Secondary | ICD-10-CM | POA: Insufficient documentation

## 2017-06-16 DIAGNOSIS — Z362 Encounter for other antenatal screening follow-up: Secondary | ICD-10-CM | POA: Insufficient documentation

## 2017-06-16 DIAGNOSIS — O24112 Pre-existing diabetes mellitus, type 2, in pregnancy, second trimester: Secondary | ICD-10-CM | POA: Insufficient documentation

## 2017-06-26 ENCOUNTER — Ambulatory Visit (INDEPENDENT_AMBULATORY_CARE_PROVIDER_SITE_OTHER): Payer: Self-pay | Admitting: Family Medicine

## 2017-06-26 VITALS — BP 109/67 | HR 97 | Wt 148.2 lb

## 2017-06-26 DIAGNOSIS — O0992 Supervision of high risk pregnancy, unspecified, second trimester: Secondary | ICD-10-CM

## 2017-06-26 DIAGNOSIS — O24919 Unspecified diabetes mellitus in pregnancy, unspecified trimester: Secondary | ICD-10-CM

## 2017-06-26 DIAGNOSIS — O0990 Supervision of high risk pregnancy, unspecified, unspecified trimester: Secondary | ICD-10-CM

## 2017-06-26 DIAGNOSIS — O09522 Supervision of elderly multigravida, second trimester: Secondary | ICD-10-CM

## 2017-06-26 DIAGNOSIS — Z23 Encounter for immunization: Secondary | ICD-10-CM

## 2017-06-26 DIAGNOSIS — O24912 Unspecified diabetes mellitus in pregnancy, second trimester: Secondary | ICD-10-CM

## 2017-06-26 LAB — POCT URINALYSIS DIP (DEVICE)
BILIRUBIN URINE: NEGATIVE
Glucose, UA: NEGATIVE mg/dL
HGB URINE DIPSTICK: NEGATIVE
Ketones, ur: NEGATIVE mg/dL
Nitrite: NEGATIVE
PH: 7 (ref 5.0–8.0)
Protein, ur: NEGATIVE mg/dL
SPECIFIC GRAVITY, URINE: 1.015 (ref 1.005–1.030)
Urobilinogen, UA: 1 mg/dL (ref 0.0–1.0)

## 2017-06-26 MED ORDER — TETANUS-DIPHTH-ACELL PERTUSSIS 5-2.5-18.5 LF-MCG/0.5 IM SUSP
0.5000 mL | Freq: Once | INTRAMUSCULAR | Status: AC
  Administered 2017-06-26: 0.5 mL via INTRAMUSCULAR

## 2017-06-26 NOTE — Progress Notes (Signed)
    PRENATAL VISIT NOTE  Subjective:  Launa FlightMaria Maggart is a 41 y.o. G7P6006 at 4899w4d being seen today for ongoing prenatal care.  She is currently monitored for the following issues for this high-risk pregnancy and has Supervision of high risk pregnancy, antepartum; Diabetes mellitus affecting pregnancy, antepartum; Advanced maternal age in multigravida, second trimester; Language barrier; and Proteinuria affecting pregnancy in second trimester on her problem list.  Patient reports no complaints.  Contractions: Not present. Vag. Bleeding: None.  Movement: Present. Denies leaking of fluid.   The following portions of the patient's history were reviewed and updated as appropriate: allergies, current medications, past family history, past medical history, past social history, past surgical history and problem list. Problem list updated.  Objective:   Vitals:   06/26/17 0955  BP: 109/67  Pulse: 97    Fetal Status: Fetal Heart Rate (bpm): 175 Fundal Height: 25 cm Movement: Present     General:  Alert, oriented and cooperative. Patient is in no acute distress.  Skin: Skin is warm and dry. No rash noted.   Cardiovascular: Normal heart rate noted  Respiratory: Normal respiratory effort, no problems with respiration noted  Abdomen: Soft, gravid, appropriate for gestational age.  Pain/Pressure: Absent     Pelvic: Cervical exam deferred        Extremities: Normal range of motion.  Edema: Mild pitting, slight indentation  Mental Status:  Normal mood and affect. Normal behavior. Normal judgment and thought content.  FBS FBS 75-105 (1 out of range) 2 hour pp 78-131 (6 of 24 out of range) Assessment and Plan:  Pregnancy: G7P6006 at 6599w4d  1. Supervision of high risk pregnancy, antepartum 28 wk labs today - RPR - CBC - HIV antibody (with reflex)  2. Diabetes mellitus affecting pregnancy, antepartum Continue insulin and no adjustment needed today  3. Advanced maternal age in multigravida,  second trimester abnl quad and declined further testing.  Preterm labor symptoms and general obstetric precautions including but not limited to vaginal bleeding, contractions, leaking of fluid and fetal movement were reviewed in detail with the patient. Please refer to After Visit Summary for other counseling recommendations.  Return in 2 weeks (on 07/10/2017).   Reva Boresanya S Aaleeyah Bias, MD

## 2017-06-26 NOTE — Progress Notes (Signed)
Video Interpreter # [id number] tdap vaccine

## 2017-06-26 NOTE — Patient Instructions (Signed)
Segundo trimestre de embarazo (Second Trimester of Pregnancy) El segundo trimestre va desde la semana13 hasta la 28, desde el cuarto hasta el sexto mes, y suele ser el momento en el que mejor se siente. Su organismo se ha adaptado a estar embarazada y comienza a sentirse fsicamente mejor. En general, las nuseas matutinas han disminuido o han desaparecido completamente, puede tener ms energa y un aumento de apetito. El segundo trimestre es tambin la poca en la que el feto se desarrolla rpidamente. Hacia el final del sexto mes, el feto mide aproximadamente 9pulgadas (23cm) y pesa alrededor de 1 libras (700g). Es probable que sienta que el beb se mueve (da pataditas) entre las 18 y 20semanas del embarazo. CAMBIOS EN EL ORGANISMO Su organismo atraviesa por muchos cambios durante el embarazo, y estos varan de una mujer a otra.  Seguir aumentando de peso. Notar que la parte baja del abdomen sobresale.  Podrn aparecer las primeras estras en las caderas, el abdomen y las mamas.  Es posible que tenga dolores de cabeza que pueden aliviarse con los medicamentos que el mdico le permita tomar.  Tal vez tenga necesidad de orinar con ms frecuencia porque el feto est ejerciendo presin sobre la vejiga.  Debido al embarazo podr sentir acidez estomacal con frecuencia.  Puede estar estreida, ya que ciertas hormonas enlentecen los movimientos de los msculos que empujan los desechos a travs de los intestinos.  Pueden aparecer hemorroides o abultarse e hincharse las venas (venas varicosas).  Puede tener dolor de espalda que se debe al aumento de peso y a que las hormonas del embarazo relajan las articulaciones entre los huesos de la pelvis, y como consecuencia de la modificacin del peso y los msculos que mantienen el equilibrio.  Las mamas seguirn creciendo y le dolern.  Las encas pueden sangrar y estar sensibles al cepillado y al hilo dental.  Pueden aparecer zonas oscuras o  manchas (cloasma, mscara del embarazo) en el rostro que probablemente se atenuar despus del nacimiento del beb.  Es posible que se forme una lnea oscura desde el ombligo hasta la zona del pubis (linea nigra) que probablemente se atenuar despus del nacimiento del beb.  Tal vez haya cambios en el cabello que pueden incluir su engrosamiento, crecimiento rpido y cambios en la textura. Adems, a algunas mujeres se les cae el cabello durante o despus del embarazo, o tienen el cabello seco o fino. Lo ms probable es que el cabello se le normalice despus del nacimiento del beb. QU DEBE ESPERAR EN LAS CONSULTAS PRENATALES Durante una visita prenatal de rutina:  La pesarn para asegurarse de que usted y el feto estn creciendo normalmente.  Le tomarn la presin arterial.  Le medirn el abdomen para controlar el desarrollo del beb.  Se escucharn los latidos cardacos fetales.  Se evaluarn los resultados de los estudios solicitados en visitas anteriores. El mdico puede preguntarle lo siguiente:  Cmo se siente.  Si siente los movimientos del beb.  Si ha tenido sntomas anormales, como prdida de lquido, sangrado, dolores de cabeza intensos o clicos abdominales.  Si est consumiendo algn producto que contenga tabaco, como cigarrillos, tabaco de mascar y cigarrillos electrnicos.  Si tiene alguna pregunta. Otros estudios que podrn realizarse durante el segundo trimestre incluyen lo siguiente:  Anlisis de sangre para detectar lo siguiente:  Concentraciones de hierro bajas (anemia).  Diabetes gestacional (entre la semana 24 y la 28).  Anticuerpos Rh.  Anlisis de orina para detectar infecciones, diabetes o protenas en la orina.    Una ecografa para confirmar que el beb crece y se desarrolla correctamente.  Una amniocentesis para diagnosticar posibles problemas genticos.  Estudios del feto para descartar espina bfida y sndrome de Down.  Prueba del VIH (virus  de inmunodeficiencia humana). Los exmenes prenatales de rutina incluyen la prueba de deteccin del VIH, a menos que decida no realizrsela. INSTRUCCIONES PARA EL CUIDADO EN EL HOGAR  Evite fumar, consumir hierbas, beber alcohol y tomar frmacos que no le hayan recetado. Estas sustancias qumicas afectan la formacin y el desarrollo del beb.  No consuma ningn producto que contenga tabaco, lo que incluye cigarrillos, tabaco de mascar y cigarrillos electrnicos. Si necesita ayuda para dejar de fumar, consulte al mdico. Puede recibir asesoramiento y otro tipo de recursos para dejar de fumar.  Siga las indicaciones del mdico en relacin con el uso de medicamentos. Durante el embarazo, hay medicamentos que son seguros de tomar y otros que no.  Haga ejercicio solamente como se lo haya indicado el mdico. Sentir clicos uterinos es un buen signo para detener la actividad fsica.  Contine comiendo alimentos sanos con regularidad.  Use un sostn que le brinde buen soporte si le duelen las mamas.  No se d baos de inmersin en agua caliente, baos turcos ni saunas.  Use el cinturn de seguridad en todo momento mientras conduce.  No coma carne cruda ni queso sin cocinar; evite el contacto con las bandejas sanitarias de los gatos y la tierra que estos animales usan. Estos elementos contienen grmenes que pueden causar defectos congnitos en el beb.  Tome las vitaminas prenatales.  Tome entre 1500 y 2000mg de calcio diariamente comenzando en la semana20 del embarazo hasta el parto.  Si est estreida, pruebe un laxante suave (si el mdico lo autoriza). Consuma ms alimentos ricos en fibra, como vegetales y frutas frescos y cereales integrales. Beba gran cantidad de lquido para mantener la orina de tono claro o color amarillo plido.  Dese baos de asiento con agua tibia para aliviar el dolor o las molestias causadas por las hemorroides. Use una crema para las hemorroides si el mdico la  autoriza.  Si tiene venas varicosas, use medias de descanso. Eleve los pies durante 15minutos, 3 o 4veces por da. Limite el consumo de sal en su dieta.  No levante objetos pesados, use zapatos de tacones bajos y mantenga una buena postura.  Descanse con las piernas elevadas si tiene calambres o dolor de cintura.  Visite a su dentista si an no lo ha hecho durante el embarazo. Use un cepillo de dientes blando para higienizarse los dientes y psese el hilo dental con suavidad.  Puede seguir manteniendo relaciones sexuales, a menos que el mdico le indique lo contrario.  Concurra a todas las visitas prenatales segn las indicaciones de su mdico. SOLICITE ATENCIN MDICA SI:  Tiene mareos.  Siente clicos leves, presin en la pelvis o dolor persistente en el abdomen.  Tiene nuseas, vmitos o diarrea persistentes.  Observa una secrecin vaginal con mal olor.  Siente dolor al orinar. SOLICITE ATENCIN MDICA DE INMEDIATO SI:  Tiene fiebre.  Tiene una prdida de lquido por la vagina.  Tiene sangrado o pequeas prdidas vaginales.  Siente dolor intenso o clicos en el abdomen.  Sube o baja de peso rpidamente.  Tiene dificultad para respirar y siente dolor de pecho.  Sbitamente se le hinchan mucho el rostro, las manos, los tobillos, los pies o las piernas.  No ha sentido los movimientos del beb durante una hora.  Siente un   dolor de cabeza intenso que no se alivia con medicamentos.  Su visin se modifica. Esta informacin no tiene como fin reemplazar el consejo del mdico. Asegrese de hacerle al mdico cualquier pregunta que tenga. Document Released: 05/21/2005 Document Revised: 09/01/2014 Document Reviewed: 10/12/2012 Elsevier Interactive Patient Education  2017 [employer]   Lactancia materna (Breastfeeding) Decidir amamantar es una de las mejores elecciones que puede hacer por usted y su beb. El cambio hormonal durante el embarazo produce el desarrollo del  tejido mamario y aumenta la cantidad y el tamao de los conductos galactforos. Estas hormonas tambin permiten que las protenas, los azcares y las grasas de la sangre produzcan la leche materna en las glndulas productoras de leche. Las hormonas impiden que la leche materna sea liberada antes del nacimiento del beb, adems de impulsar el flujo de leche luego del nacimiento. Una vez que ha comenzado a amamantar, pensar en el beb, as como la succin o el llanto, pueden estimular la liberacin de leche de las glndulas productoras de leche. LOS BENEFICIOS DE AMAMANTAR Para el beb   La primera leche (calostro) ayuda a mejorar el funcionamiento del sistema digestivo del beb.  La leche tiene anticuerpos que ayudan a prevenir las infecciones en el beb.  El beb tiene una menor incidencia de asma, alergias y del sndrome de muerte sbita del lactante.  Los nutrientes en la leche materna son mejores para el beb que la leche maternizada y estn preparados exclusivamente para cubrir las necesidades del beb.  La leche materna mejora el desarrollo cerebral del beb.  Es menos probable que el beb desarrolle otras enfermedades, como obesidad infantil, asma o diabetes mellitus de tipo 2. Para usted   La lactancia materna favorece el desarrollo de un vnculo muy especial entre la madre y el beb.  Es conveniente. La leche materna siempre est disponible a la temperatura correcta y es econmica.  La lactancia materna ayuda a quemar caloras y a perder el peso ganado durante el embarazo.  Favorece la contraccin del tero al tamao que tena antes del embarazo de manera ms rpida y disminuye el sangrado (loquios) despus del parto.  La lactancia materna contribuye a reducir el riesgo de desarrollar diabetes mellitus de tipo 2, osteoporosis o cncer de mama o de ovario en el futuro. SIGNOS DE QUE EL BEB EST HAMBRIENTO Primeros signos de hambre   Aumenta su estado de alerta o actividad.  Se  estira.  Mueve la cabeza de un lado a otro.  Mueve la cabeza y abre la boca cuando se le toca la mejilla o la comisura de la boca (reflejo de bsqueda).  Aumenta las vocalizaciones, tales como sonidos de succin, se relame los labios, emite arrullos, suspiros, o chirridos.  Mueve la mano hacia la boca.  Se chupa con ganas los dedos o las manos. Signos tardos de hambre   Est agitado.  Llora de manera intermitente. Signos de hambre extrema  Los signos de hambre extrema requerirn que lo calme y lo consuele antes de que el beb pueda alimentarse adecuadamente. No espere a que se manifiesten los siguientes signos de hambre extrema para comenzar a amamantar:  Agitacin.  Llanto intenso y fuerte.  Gritos. INFORMACIN BSICA SOBRE LA LACTANCIA MATERNA Iniciacin de la lactancia materna   Encuentre un lugar cmodo para sentarse o acostarse, con un buen respaldo para el cuello y la espalda.  Coloque una almohada o una manta enrollada debajo del beb para acomodarlo a la altura de la mama (si est sentada).   Las almohadas para amamantar se han diseado especialmente a fin de servir de apoyo para los brazos y el beb mientras amamanta.  Asegrese de que el abdomen del beb est frente al suyo.  Masajee suavemente la mama. Con las yemas de los dedos, masajee la pared del pecho hacia el pezn en un movimiento circular. Esto estimula el flujo de leche. Es posible que deba continuar este movimiento mientras amamanta si la leche fluye lentamente.  Sostenga la mama con el pulgar por arriba del pezn y los otros 4 dedos por debajo de la mama. Asegrese de que los dedos se encuentren lejos del pezn y de la boca del beb.  Empuje suavemente los labios del beb con el pezn o con el dedo.  Cuando la boca del beb se abra lo suficiente, acrquelo rpidamente a la mama e introduzca todo el pezn y la zona oscura que lo rodea (areola), tanto como sea posible, dentro de la boca del beb.  Debe  haber ms areola visible por arriba del labio superior del beb que por debajo del labio inferior.  La lengua del beb debe estar entre la enca inferior y la mama.  Asegrese de que la boca del beb est en la posicin correcta alrededor del pezn (prendida). Los labios del beb deben crear un sello sobre la mama y estar doblados hacia afuera (invertidos).  Es comn que el beb succione durante 2 a 3 minutos para que comience el flujo de leche materna. Cmo debe prenderse  Es muy importante que le ensee al beb cmo prenderse adecuadamente a la mama. Si el beb no se prende adecuadamente, puede causarle dolor en el pezn y reducir la produccin de leche materna, y hacer que el beb tenga un escaso aumento de peso. Adems, si el beb no se prende adecuadamente al pezn, puede tragar aire durante la alimentacin. Esto puede causarle molestias al beb. Hacer eructar al beb al cambiar de mama puede ayudarlo a liberar el aire. Sin embargo, ensearle al beb cmo prenderse a la mama adecuadamente es la mejor manera de evitar que se sienta molesto por tragar aire mientras se alimenta. Signos de que el beb se ha prendido adecuadamente al pezn:  Tironea o succiona de modo silencioso, sin causarle dolor.  Se escucha que traga cada 3 o 4 succiones.  Hay movimientos musculares por arriba y por delante de sus odos al succionar. Signos de que el beb no se ha prendido adecuadamente al pezn:  Hace ruidos de succin o de chasquido mientras se alimenta.  Siente dolor en el pezn. Si cree que el beb no se prendi correctamente, deslice el dedo en la comisura de la boca y colquelo entre las encas del beb para interrumpir la succin. Intente comenzar a amamantar nuevamente. Signos de lactancia materna exitosa  Signos del beb:  Disminuye gradualmente el nmero de succiones o cesa la succin por completo.  Se duerme.  Relaja el cuerpo.  Retiene una pequea cantidad de leche en la boca.  Se  desprende solo del pecho. Signos que presenta usted:  Las mamas han aumentado la firmeza, el peso y el tamao 1 a 3 horas despus de amamantar.  Estn ms blandas inmediatamente despus de amamantar.  Un aumento del volumen de leche, y tambin un cambio en su consistencia y color se producen hacia el quinto da de lactancia materna.  Los pezones no duelen, ni estn agrietados ni sangran. Signos de que su beb recibe la cantidad de leche suficiente   Mojar   por lo menos 1 o 2 paales durante las primeras 24 horas despus del nacimiento.  Mojar por lo menos 5 o 6 paales cada 24 horas durante la primera semana despus del nacimiento. La orina debe ser transparente o de color amarillo plido a los 5 das despus del nacimiento.  Mojar entre 6 y 8 paales cada 24 horas a medida que el beb sigue creciendo y desarrollndose.  Defeca al menos 3 veces en 24 horas a los 5 das de vida. La materia fecal debe ser blanda y amarillenta.  Defeca al menos 3 veces en 24 horas a los 7 das de vida. La materia fecal debe ser grumosa y amarillenta.  No registra una prdida de peso mayor del 10% del peso al nacer durante los primeros 3 das de vida.  Aumenta de peso un promedio de 4 a 7onzas (113 a 198g) por semana despus de los 4 das de vida.  Aumenta de peso, diariamente, de manera uniforme a partir de los 5 das de vida, sin registrar prdida de peso despus de las 2semanas de vida. Despus de alimentarse, es posible que el beb regurgite una pequea cantidad. Esto es frecuente. FRECUENCIA Y DURACIN DE LA LACTANCIA MATERNA El amamantamiento frecuente la ayudar a producir ms leche y a prevenir problemas de dolor en los pezones e hinchazn en las mamas. Alimente al beb cuando muestre signos de hambre o si siente la necesidad de reducir la congestin de las mamas. Esto se denomina "lactancia a demanda". Evite el uso del chupete mientras trabaja para establecer la lactancia (las primeras 4 a 6  semanas despus del nacimiento del beb). Despus de este perodo, podr ofrecerle un chupete. Las investigaciones demostraron que el uso del chupete durante el primer ao de vida del beb disminuye el riesgo de desarrollar el sndrome de muerte sbita del lactante (SMSL). Permita que el nio se alimente en cada mama todo lo que desee. Contine amamantando al beb hasta que haya terminado de alimentarse. Cuando el beb se desprende o se queda dormido mientras se est alimentando de la primera mama, ofrzcale la segunda. Debido a que, con frecuencia, los recin nacidos permanecen somnolientos las primeras semanas de vida, es posible que deba despertar al beb para alimentarlo. Los horarios de lactancia varan de un beb a otro. Sin embargo, las siguientes reglas pueden servir como gua para ayudarla a garantizar que el beb se alimenta adecuadamente:  Se puede amamantar a los recin nacidos (bebs de 4 semanas o menos de vida) cada 1 a 3 horas.  No deben transcurrir ms de 3 horas durante el da o 5 horas durante la noche sin que se amamante a los recin nacidos.  Debe amamantar al beb 8 veces como mnimo en un perodo de 24 horas, hasta que comience a introducir slidos en su dieta, a los 6 meses de vida aproximadamente. EXTRACCIN DE LECHE MATERNA La extraccin y el almacenamiento de la leche materna le permiten asegurarse de que el beb se alimente exclusivamente de leche materna, aun en momentos en los que no puede amamantar. Esto tiene especial importancia si debe regresar al trabajo en el perodo en que an est amamantando o si no puede estar presente en los momentos en que el beb debe alimentarse. Su asesor en lactancia puede orientarla sobre cunto tiempo es seguro almacenar leche materna. El sacaleche es un aparato que le permite extraer leche de la mama a un recipiente estril. Luego, la leche materna extrada puede almacenarse en un refrigerador o congelador.   Algunos sacaleches son manuales,  mientras que otros son elctricos. Consulte a su asesor en lactancia qu tipo ser ms conveniente para usted. Los sacaleches se pueden comprar; sin embargo, algunos hospitales y grupos de apoyo a la lactancia materna alquilan sacaleches mensualmente. Un asesor en lactancia puede ensearle cmo extraer leche materna manualmente, en caso de que prefiera no usar un sacaleche. CMO CUIDAR LAS MAMAS DURANTE LA LACTANCIA MATERNA Los pezones se secan, agrietan y duelen durante la lactancia materna. Las siguientes recomendaciones pueden ayudarla a mantener las mamas humectadas y sanas:  Evite usar jabn en los pezones.  Use un sostn de soporte. Aunque no son esenciales, las camisetas sin mangas o los sostenes especiales para amamantar estn diseados para acceder fcilmente a las mamas, para amamantar sin tener que quitarse todo el sostn o la camiseta. Evite usar sostenes con aro o sostenes muy ajustados.  Seque al aire sus pezones durante 3 a 4minutos despus de amamantar al beb.  Utilice solo apsitos de algodn en el sostn para absorber las prdidas de leche. La prdida de un poco de leche materna entre las tomas es normal.  Utilice lanolina sobre los pezones luego de amamantar. La lanolina ayuda a mantener la humedad normal de la piel. Si usa lanolina pura, no tiene que lavarse los pezones antes de volver a alimentar al beb. La lanolina pura no es txica para el beb. Adems, puede extraer manualmente algunas gotas de leche materna y masajear suavemente esa leche sobre los pezones, para que la leche se seque al aire. Durante las primeras semanas despus de dar a luz, algunas mujeres pueden experimentar hinchazn en las mamas (congestin mamaria). La congestin puede hacer que sienta las mamas pesadas, calientes y sensibles al tacto. El pico de la congestin ocurre dentro de los 3 a 5 das despus del parto. Las siguientes recomendaciones pueden ayudarla a aliviar la congestin:  Vace por completo  las mamas al amamantar o extraer leche. Puede aplicar calor hmedo en las mamas (en la ducha o con toallas hmedas para manos) antes de amamantar o extraer leche. Esto aumenta la circulacin y ayuda a que la leche fluya. Si el beb no vaca por completo las mamas cuando lo amamanta, extraiga la leche restante despus de que haya finalizado.  Use un sostn ajustado (para amamantar o comn) o una camiseta sin mangas durante 1 o 2 das para indicar al cuerpo que disminuya ligeramente la produccin de leche.  Aplique compresas de hielo sobre las mamas, a menos que le resulte demasiado incmodo.  Asegrese de que el beb est prendido y se encuentre en la posicin correcta mientras lo alimenta. Si la congestin persiste luego de 48 horas o despus de seguir estas recomendaciones, comunquese con su mdico o un asesor en lactancia. RECOMENDACIONES GENERALES PARA EL CUIDADO DE LA SALUD DURANTE LA LACTANCIA MATERNA  Consuma alimentos saludables. Alterne comidas y colaciones, y coma 3 de cada una por da. Dado que lo que come afecta la leche materna, es posible que algunas comidas hagan que su beb se vuelva ms irritable de lo habitual. Evite comer este tipo de alimentos si percibe que afectan de manera negativa al beb.  Beba leche, jugos de fruta y agua para satisfacer su sed (aproximadamente 10 vasos al da).  Descanse con frecuencia, reljese y tome sus vitaminas prenatales para evitar la fatiga, el estrs y la anemia.  Contine con los autocontroles de la mama.  Evite masticar y fumar tabaco. Las sustancias qumicas de los cigarrillos que pasan   a la leche materna y la exposicin al humo ambiental del tabaco pueden daar al beb.  No consuma alcohol ni drogas, incluida la marihuana. Algunos medicamentos, que pueden ser perjudiciales para el beb, pueden pasar a travs de la leche materna. Es importante que consulte a su mdico antes de tomar cualquier medicamento, incluidos todos los medicamentos  recetados y de venta libre, as como los suplementos vitamnicos y herbales. Puede quedar embarazada durante la lactancia. Si desea controlar la natalidad, consulte a su mdico cules son las opciones ms seguras para el beb. SOLICITE ATENCIN MDICA SI:  Usted siente que quiere dejar de amamantar o se siente frustrada con la lactancia.  Siente dolor en las mamas o en los pezones.  Sus pezones estn agrietados o sangran.  Sus pechos estn irritados, sensibles o calientes.  Tiene un rea hinchada en cualquiera de las mamas.  Siente escalofros o fiebre.  Tiene nuseas o vmitos.  Presenta una secrecin de otro lquido distinto de la leche materna de los pezones.  Sus mamas no se llenan antes de amamantar al beb para el quinto da despus del parto.  Se siente triste y deprimida.  El beb est demasiado somnoliento como para comer bien.  El beb tiene problemas para dormir.  Moja menos de 3 paales en 24 horas.  Defeca menos de 3 veces en 24 horas.  La piel del beb o la parte blanca de los ojos se vuelven amarillentas.  El beb no ha aumentado de peso a los 5 das de vida. SOLICITE ATENCIN MDICA DE INMEDIATO SI:  El beb est muy cansado (letargo) y no se quiere despertar para comer.  Le sube la fiebre sin causa. Esta informacin no tiene como fin reemplazar el consejo del mdico. Asegrese de hacerle al mdico cualquier pregunta que tenga. Document Released: 08/11/2005 Document Revised: 12/03/2015 Document Reviewed: 02/02/2013 Elsevier Interactive Patient Education  2017 [employer]  

## 2017-06-27 LAB — CBC
HEMATOCRIT: 31 % — AB (ref 34.0–46.6)
Hemoglobin: 10.5 g/dL — ABNORMAL LOW (ref 11.1–15.9)
MCH: 30.4 pg (ref 26.6–33.0)
MCHC: 33.9 g/dL (ref 31.5–35.7)
MCV: 90 fL (ref 79–97)
Platelets: 358 10*3/uL (ref 150–379)
RBC: 3.45 x10E6/uL — ABNORMAL LOW (ref 3.77–5.28)
RDW: 13.6 % (ref 12.3–15.4)
WBC: 12 10*3/uL — AB (ref 3.4–10.8)

## 2017-06-27 LAB — RPR: RPR: NONREACTIVE

## 2017-06-27 LAB — HIV ANTIBODY (ROUTINE TESTING W REFLEX): HIV Screen 4th Generation wRfx: NONREACTIVE

## 2017-07-10 ENCOUNTER — Ambulatory Visit (INDEPENDENT_AMBULATORY_CARE_PROVIDER_SITE_OTHER): Payer: Self-pay | Admitting: Family Medicine

## 2017-07-10 VITALS — BP 105/67 | HR 113 | Wt 145.4 lb

## 2017-07-10 DIAGNOSIS — O24912 Unspecified diabetes mellitus in pregnancy, second trimester: Secondary | ICD-10-CM

## 2017-07-10 DIAGNOSIS — O24919 Unspecified diabetes mellitus in pregnancy, unspecified trimester: Secondary | ICD-10-CM

## 2017-07-10 DIAGNOSIS — Z641 Problems related to multiparity: Secondary | ICD-10-CM | POA: Insufficient documentation

## 2017-07-10 DIAGNOSIS — O0992 Supervision of high risk pregnancy, unspecified, second trimester: Secondary | ICD-10-CM

## 2017-07-10 DIAGNOSIS — O09522 Supervision of elderly multigravida, second trimester: Secondary | ICD-10-CM

## 2017-07-10 DIAGNOSIS — O0990 Supervision of high risk pregnancy, unspecified, unspecified trimester: Secondary | ICD-10-CM

## 2017-07-10 LAB — POCT URINALYSIS DIP (DEVICE)
BILIRUBIN URINE: NEGATIVE
Glucose, UA: NEGATIVE mg/dL
HGB URINE DIPSTICK: NEGATIVE
Ketones, ur: NEGATIVE mg/dL
NITRITE: NEGATIVE
PH: 6.5 (ref 5.0–8.0)
PROTEIN: NEGATIVE mg/dL
Specific Gravity, Urine: 1.02 (ref 1.005–1.030)
UROBILINOGEN UA: 0.2 mg/dL (ref 0.0–1.0)

## 2017-07-10 NOTE — Progress Notes (Signed)
Video Interpreter # [phone] [id number]

## 2017-07-10 NOTE — Progress Notes (Signed)
   PRENATAL VISIT NOTE  Subjective:  Michele Morales is a 41 y.o. G7P6006 at 5877w4d being seen today for ongoing prenatal care.  She is currently monitored for the following issues for this high-risk pregnancy and has Supervision of high risk pregnancy, antepartum; Diabetes mellitus affecting pregnancy, antepartum; Advanced maternal age in multigravida, second trimester; Language barrier; Proteinuria affecting pregnancy in second trimester; and Grand multipara on their problem list.  Patient reports no complaints.  Contractions: Not present. Vag. Bleeding: None.  Movement: Present. Denies leaking of fluid.   The following portions of the patient's history were reviewed and updated as appropriate: allergies, current medications, past family history, past medical history, past social history, past surgical history and problem list. Problem list updated.  Objective:   Vitals:   07/10/17 1016  BP: 105/67  Pulse: (!) 113  Weight: 145 lb 6.4 oz (66 kg)    Fetal Status: Fetal Heart Rate (bpm): 144 Fundal Height: 29 cm Movement: Present     General:  Alert, oriented and cooperative. Patient is in no acute distress.  Skin: Skin is warm and dry. No rash noted.   Cardiovascular: Normal heart rate noted  Respiratory: Normal respiratory effort, no problems with respiration noted  Abdomen: Soft, gravid, appropriate for gestational age.  Pain/Pressure: Present     Pelvic: Cervical exam deferred        Extremities: Normal range of motion.  Edema: Trace  Mental Status:  Normal mood and affect. Normal behavior. Normal judgment and thought content.  FBS 78-92 2 hour pp 101-130 Assessment and Plan:  Pregnancy: G7P6006 at 2777w4d  1. Diabetes mellitus affecting pregnancy, antepartum Excellent control Continue ASA and Insulin F/u u/s for growth scheduled 07/14/17  2. Advanced maternal age in multigravida, second trimester Declined NIPS, abnl quad  3. Supervision of high risk pregnancy,  antepartum Continue prenatal care.   4. Grand multipara   Preterm labor symptoms and general obstetric precautions including but not limited to vaginal bleeding, contractions, leaking of fluid and fetal movement were reviewed in detail with the patient. Please refer to After Visit Summary for other counseling recommendations.  Return in 2 weeks (on 07/24/2017).   Reva Boresanya S Breckyn Ticas, MD

## 2017-07-10 NOTE — Patient Instructions (Signed)
Tercer trimestre de embarazo (Third Trimester of Pregnancy) El tercer trimestre comprende desde la semana29 hasta la semana42, es decir, desde el mes7 hasta el mes9. El tercer trimestre es un perodo en el que el feto crece rpidamente. Hacia el final del noveno mes, el feto mide alrededor de 20pulgadas (45cm) de largo y pesa entre 6 y 10 libras (2,700 y 4,500kg). CAMBIOS EN EL ORGANISMO Su organismo atraviesa por muchos cambios durante el embarazo, y estos varan de una mujer a otra.  Seguir aumentando de peso. Es de esperar que aumente entre 25 y 35libras (11 y 16kg) hacia el final del embarazo.  Podrn aparecer las primeras estras en las caderas, el abdomen y las mamas.  Puede tener necesidad de orinar con ms frecuencia porque el feto baja hacia la pelvis y ejerce presin sobre la vejiga.  Debido al embarazo podr sentir acidez estomacal con frecuencia.  Puede estar estreida, ya que ciertas hormonas enlentecen los movimientos de los msculos que empujan los desechos a travs de los intestinos.  Pueden aparecer hemorroides o abultarse e hincharse las venas (venas varicosas).  Puede sentir dolor plvico debido al aumento de peso y a que las hormonas del embarazo relajan las articulaciones entre los huesos de la pelvis. El dolor de espalda puede ser consecuencia de la sobrecarga de los msculos que soportan la postura.  Tal vez haya cambios en el cabello que pueden incluir su engrosamiento, crecimiento rpido y cambios en la textura. Adems, a algunas mujeres se les cae el cabello durante o despus del embarazo, o tienen el cabello seco o fino. Lo ms probable es que el cabello se le normalice despus del nacimiento del beb.  Las mamas seguirn creciendo y le dolern. A veces, puede haber una secrecin amarilla de las mamas llamada calostro.  El ombligo puede salir hacia afuera.  Puede sentir que le falta el aire debido a que se expande el tero.  Puede notar que el feto  "baja" o lo siente ms bajo, en el abdomen.  Puede tener una prdida de secrecin mucosa con sangre. Esto suele ocurrir en el trmino de unos pocos das a una semana antes de que comience el trabajo de parto.  El cuello del tero se vuelve delgado y blando (se borra) cerca de la fecha de parto. QU DEBE ESPERAR EN LOS EXMENES PRENATALES Le harn exmenes prenatales cada 2semanas hasta la semana36. A partir de ese momento le harn exmenes semanales. Durante una visita prenatal de rutina:  La pesarn para asegurarse de que usted y el feto estn creciendo normalmente.  Le tomarn la presin arterial.  Le medirn el abdomen para controlar el desarrollo del beb.  Se escucharn los latidos cardacos fetales.  Se evaluarn los resultados de los estudios solicitados en visitas anteriores.  Le revisarn el cuello del tero cuando est prxima la fecha de parto para controlar si este se ha borrado. Alrededor de la semana36, el mdico le revisar el cuello del tero. Al mismo tiempo, realizar un anlisis de las secreciones del tejido vaginal. Este examen es para determinar si hay un tipo de bacteria, estreptococo Grupo B. El mdico le explicar esto con ms detalle. El mdico puede preguntarle lo siguiente:  Cmo le gustara que fuera el parto.  Cmo se siente.  Si siente los movimientos del beb.  Si ha tenido sntomas anormales, como prdida de lquido, sangrado, dolores de cabeza intensos o clicos abdominales.  Si est consumiendo algn producto que contenga tabaco, como cigarrillos, tabaco de mascar y   cigarrillos electrnicos.  Si tiene alguna pregunta. Otros exmenes o estudios de deteccin que pueden realizarse durante el tercer trimestre incluyen lo siguiente:  Anlisis de sangre para controlar los niveles de hierro (anemia).  Controles fetales para determinar su salud, nivel de actividad y crecimiento. Si tiene alguna enfermedad o hay problemas durante el embarazo, le harn  estudios.  Prueba del VIH (virus de inmunodeficiencia humana). Si corre un riesgo alto, pueden realizarle una prueba de deteccin del VIH durante el tercer trimestre del embarazo. FALSO TRABAJO DE PARTO Es posible que sienta contracciones leves e irregulares que finalmente desaparecen. Se llaman contracciones de Braxton Hicks o falso trabajo de parto. Las contracciones pueden durar horas, das o incluso semanas, antes de que el verdadero trabajo de parto se inicie. Si las contracciones ocurren a intervalos regulares, se intensifican o se hacen dolorosas, lo mejor es que la revise el mdico. SIGNOS DE TRABAJO DE PARTO  Clicos de tipo menstrual.  Contracciones cada 5minutos o menos.  Contracciones que comienzan en la parte superior del tero y se extienden hacia abajo, a la zona inferior del abdomen y la espalda.  Sensacin de mayor presin en la pelvis o dolor de espalda.  Una secrecin de mucosidad acuosa o con sangre que sale de la vagina. Si tiene alguno de estos signos antes de la semana37 del embarazo, llame a su mdico de inmediato. Debe concurrir al hospital para que la controlen inmediatamente. INSTRUCCIONES PARA EL CUIDADO EN EL HOGAR  Evite fumar, consumir hierbas, beber alcohol y tomar frmacos que no le hayan recetado. Estas sustancias qumicas afectan la formacin y el desarrollo del beb.  No consuma ningn producto que contenga tabaco, lo que incluye cigarrillos, tabaco de mascar y cigarrillos electrnicos. Si necesita ayuda para dejar de fumar, consulte al mdico. Puede recibir asesoramiento y otro tipo de recursos para dejar de fumar.  Siga las indicaciones del mdico en relacin con el uso de medicamentos. Durante el embarazo, hay medicamentos que son seguros de tomar y otros que no.  Haga ejercicio solamente como se lo haya indicado el mdico. Sentir clicos uterinos es un buen signo para detener la actividad fsica.  Contine comiendo alimentos sanos con  regularidad.  Use un sostn que le brinde buen soporte si le duelen las mamas.  No se d baos de inmersin en agua caliente, baos turcos ni saunas.  Use el cinturn de seguridad en todo momento mientras conduce.  No coma carne cruda ni queso sin cocinar; evite el contacto con las bandejas sanitarias de los gatos y la tierra que estos animales usan. Estos elementos contienen grmenes que pueden causar defectos congnitos en el beb.  Tome las vitaminas prenatales.  Tome entre 1500 y 2000mg de calcio diariamente comenzando en la semana20 del embarazo hasta el parto.  Si est estreida, pruebe un laxante suave (si el mdico lo autoriza). Consuma ms alimentos ricos en fibra, como vegetales y frutas frescos y cereales integrales. Beba gran cantidad de lquido para mantener la orina de tono claro o color amarillo plido.  Dese baos de asiento con agua tibia para aliviar el dolor o las molestias causadas por las hemorroides. Use una crema para las hemorroides si el mdico la autoriza.  Si tiene venas varicosas, use medias de descanso. Eleve los pies durante 15minutos, 3 o 4veces por da. Limite el consumo de sal en su dieta.  Evite levantar objetos pesados, use zapatos de tacones bajos y mantenga una buena postura.  Descanse con las piernas elevadas si tiene   calambres o dolor de cintura.  Visite a su dentista si no lo ha hecho durante el embarazo. Use un cepillo de dientes blando para higienizarse los dientes y psese el hilo dental con suavidad.  Puede seguir manteniendo relaciones sexuales, a menos que el mdico le indique lo contrario.  No haga viajes largos excepto que sea absolutamente necesario y solo con la autorizacin del mdico.  Tome clases prenatales para entender, practicar y hacer preguntas sobre el trabajo de parto y el parto.  Haga un ensayo de la partida al hospital.  Prepare el bolso que llevar al hospital.  Prepare la habitacin del beb.  Concurra a todas  las visitas prenatales segn las indicaciones de su mdico.  SOLICITE ATENCIN MDICA SI:  No est segura de que est en trabajo de parto o de que ha roto la bolsa de las aguas.  Tiene mareos.  Siente clicos leves, presin en la pelvis o dolor persistente en el abdomen.  Tiene nuseas, vmitos o diarrea persistentes.  Observa una secrecin vaginal con mal olor.  Siente dolor al orinar.  SOLICITE ATENCIN MDICA DE INMEDIATO SI:  Tiene fiebre.  Tiene una prdida de lquido por la vagina.  Tiene sangrado o pequeas prdidas vaginales.  Siente dolor intenso o clicos en el abdomen.  Sube o baja de peso rpidamente.  Tiene dificultad para respirar y siente dolor de pecho.  Sbitamente se le hinchan mucho el rostro, las manos, los tobillos, los pies o las piernas.  No ha sentido los movimientos del beb durante una hora.  Siente un dolor de cabeza intenso que no se alivia con medicamentos.  Su visin se modifica.  Esta informacin no tiene como fin reemplazar el consejo del mdico. Asegrese de hacerle al mdico cualquier pregunta que tenga. Document Released: 05/21/2005 Document Revised: 09/01/2014 Document Reviewed: 10/12/2012 Elsevier Interactive Patient Education  2017 [employer]   Lactancia materna (Breastfeeding) Decidir amamantar es una de las mejores elecciones que puede hacer por usted y su beb. El cambio hormonal durante el embarazo produce el desarrollo del tejido mamario y aumenta la cantidad y el tamao de los conductos galactforos. Estas hormonas tambin permiten que las protenas, los azcares y las grasas de la sangre produzcan la leche materna en las glndulas productoras de leche. Las hormonas impiden que la leche materna sea liberada antes del nacimiento del beb, adems de impulsar el flujo de leche luego del nacimiento. Una vez que ha comenzado a amamantar, pensar en el beb, as como la succin o el llanto, pueden estimular la liberacin de leche  de las glndulas productoras de leche. LOS BENEFICIOS DE AMAMANTAR Para el beb  La primera leche (calostro) ayuda a mejorar el funcionamiento del sistema digestivo del beb.  La leche tiene anticuerpos que ayudan a prevenir las infecciones en el beb.  El beb tiene una menor incidencia de asma, alergias y del sndrome de muerte sbita del lactante.  Los nutrientes en la leche materna son mejores para el beb que la leche maternizada y estn preparados exclusivamente para cubrir las necesidades del beb.  La leche materna mejora el desarrollo cerebral del beb.  Es menos probable que el beb desarrolle otras enfermedades, como obesidad infantil, asma o diabetes mellitus de tipo 2. Para usted  La lactancia materna favorece el desarrollo de un vnculo muy especial entre la madre y el beb.  Es conveniente. La leche materna siempre est disponible a la temperatura correcta y es econmica.  La lactancia materna ayuda a quemar caloras y   a perder el peso ganado durante el embarazo.  Favorece la contraccin del tero al tamao que tena antes del embarazo de manera ms rpida y disminuye el sangrado (loquios) despus del parto.  La lactancia materna contribuye a reducir el riesgo de desarrollar diabetes mellitus de tipo 2, osteoporosis o cncer de mama o de ovario en el futuro. SIGNOS DE QUE EL BEB EST HAMBRIENTO Primeros signos de hambre  Aumenta su estado de alerta o actividad.  Se estira.  Mueve la cabeza de un lado a otro.  Mueve la cabeza y abre la boca cuando se le toca la mejilla o la comisura de la boca (reflejo de bsqueda).  Aumenta las vocalizaciones, tales como sonidos de succin, se relame los labios, emite arrullos, suspiros, o chirridos.  Mueve la mano hacia la boca.  Se chupa con ganas los dedos o las manos. Signos tardos de hambre  Est agitado.  Llora de manera intermitente. Signos de hambre extrema Los signos de hambre extrema requerirn que lo calme y  lo consuele antes de que el beb pueda alimentarse adecuadamente. No espere a que se manifiesten los siguientes signos de hambre extrema para comenzar a amamantar:  Agitacin.  Llanto intenso y fuerte.  Gritos. INFORMACIN BSICA SOBRE LA LACTANCIA MATERNA Iniciacin de la lactancia materna  Encuentre un lugar cmodo para sentarse o acostarse, con un buen respaldo para el cuello y la espalda.  Coloque una almohada o una manta enrollada debajo del beb para acomodarlo a la altura de la mama (si est sentada). Las almohadas para amamantar se han diseado especialmente a fin de servir de apoyo para los brazos y el beb mientras amamanta.  Asegrese de que el abdomen del beb est frente al suyo.  Masajee suavemente la mama. Con las yemas de los dedos, masajee la pared del pecho hacia el pezn en un movimiento circular. Esto estimula el flujo de leche. Es posible que deba continuar este movimiento mientras amamanta si la leche fluye lentamente.  Sostenga la mama con el pulgar por arriba del pezn y los otros 4 dedos por debajo de la mama. Asegrese de que los dedos se encuentren lejos del pezn y de la boca del beb.  Empuje suavemente los labios del beb con el pezn o con el dedo.  Cuando la boca del beb se abra lo suficiente, acrquelo rpidamente a la mama e introduzca todo el pezn y la zona oscura que lo rodea (areola), tanto como sea posible, dentro de la boca del beb. ? Debe haber ms areola visible por arriba del labio superior del beb que por debajo del labio inferior. ? La lengua del beb debe estar entre la enca inferior y la mama.  Asegrese de que la boca del beb est en la posicin correcta alrededor del pezn (prendida). Los labios del beb deben crear un sello sobre la mama y estar doblados hacia afuera (invertidos).  Es comn que el beb succione durante 2 a 3 minutos para que comience el flujo de leche materna. Cmo debe prenderse Es muy importante que le ensee al  beb cmo prenderse adecuadamente a la mama. Si el beb no se prende adecuadamente, puede causarle dolor en el pezn y reducir la produccin de leche materna, y hacer que el beb tenga un escaso aumento de peso. Adems, si el beb no se prende adecuadamente al pezn, puede tragar aire durante la alimentacin. Esto puede causarle molestias al beb. Hacer eructar al beb al cambiar de mama puede ayudarlo a liberar el   aire. Sin embargo, ensearle al beb cmo prenderse a la mama adecuadamente es la mejor manera de evitar que se sienta molesto por tragar aire mientras se alimenta. Signos de que el beb se ha prendido adecuadamente al pezn:  Tironea o succiona de modo silencioso, sin causarle dolor.  Se escucha que traga cada 3 o 4 succiones.  Hay movimientos musculares por arriba y por delante de sus odos al succionar. Signos de que el beb no se ha prendido adecuadamente al pezn:  Hace ruidos de succin o de chasquido mientras se alimenta.  Siente dolor en el pezn. Si cree que el beb no se prendi correctamente, deslice el dedo en la comisura de la boca y colquelo entre las encas del beb para interrumpir la succin. Intente comenzar a amamantar nuevamente. Signos de lactancia materna exitosa Signos del beb:  Disminuye gradualmente el nmero de succiones o cesa la succin por completo.  Se duerme.  Relaja el cuerpo.  Retiene una pequea cantidad de leche en la boca.  Se desprende solo del pecho. Signos que presenta usted:  Las mamas han aumentado la firmeza, el peso y el tamao 1 a 3 horas despus de amamantar.  Estn ms blandas inmediatamente despus de amamantar.  Un aumento del volumen de leche, y tambin un cambio en su consistencia y color se producen hacia el quinto da de lactancia materna.  Los pezones no duelen, ni estn agrietados ni sangran. Signos de que su beb recibe la cantidad de leche suficiente  Mojar por lo menos 1 o 2 paales durante las primeras 24  horas despus del nacimiento.  Mojar por lo menos 5 o 6 paales cada 24 horas durante la primera semana despus del nacimiento. La orina debe ser transparente o de color amarillo plido a los 5 das despus del nacimiento.  Mojar entre 6 y 8 paales cada 24 horas a medida que el beb sigue creciendo y desarrollndose.  Defeca al menos 3 veces en 24 horas a los 5 das de vida. La materia fecal debe ser blanda y amarillenta.  Defeca al menos 3 veces en 24 horas a los 7 das de vida. La materia fecal debe ser grumosa y amarillenta.  No registra una prdida de peso mayor del 10% del peso al nacer durante los primeros 3 das de vida.  Aumenta de peso un promedio de 4 a 7onzas (113 a 198g) por semana despus de los 4 das de vida.  Aumenta de peso, diariamente, de manera uniforme a partir de los 5 das de vida, sin registrar prdida de peso despus de las 2semanas de vida. Despus de alimentarse, es posible que el beb regurgite una pequea cantidad. Esto es frecuente. FRECUENCIA Y DURACIN DE LA LACTANCIA MATERNA El amamantamiento frecuente la ayudar a producir ms leche y a prevenir problemas de dolor en los pezones e hinchazn en las mamas. Alimente al beb cuando muestre signos de hambre o si siente la necesidad de reducir la congestin de las mamas. Esto se denomina "lactancia a demanda". Evite el uso del chupete mientras trabaja para establecer la lactancia (las primeras 4 a 6 semanas despus del nacimiento del beb). Despus de este perodo, podr ofrecerle un chupete. Las investigaciones demostraron que el uso del chupete durante el primer ao de vida del beb disminuye el riesgo de desarrollar el sndrome de muerte sbita del lactante (SMSL). Permita que el nio se alimente en cada mama todo lo que desee. Contine amamantando al beb hasta que haya terminado de alimentarse. Cuando   el beb se desprende o se queda dormido mientras se est alimentando de la primera mama, ofrzcale la segunda.  Debido a que, con frecuencia, los recin nacidos permanecen somnolientos las primeras semanas de vida, es posible que deba despertar al beb para alimentarlo. Los horarios de lactancia varan de un beb a otro. Sin embargo, las siguientes reglas pueden servir como gua para ayudarla a garantizar que el beb se alimenta adecuadamente:  Se puede amamantar a los recin nacidos (bebs de 4 semanas o menos de vida) cada 1 a 3 horas.  No deben transcurrir ms de 3 horas durante el da o 5 horas durante la noche sin que se amamante a los recin nacidos.  Debe amamantar al beb 8 veces como mnimo en un perodo de 24 horas, hasta que comience a introducir slidos en su dieta, a los 6 meses de vida aproximadamente. EXTRACCIN DE LECHE MATERNA La extraccin y el almacenamiento de la leche materna le permiten asegurarse de que el beb se alimente exclusivamente de leche materna, aun en momentos en los que no puede amamantar. Esto tiene especial importancia si debe regresar al trabajo en el perodo en que an est amamantando o si no puede estar presente en los momentos en que el beb debe alimentarse. Su asesor en lactancia puede orientarla sobre cunto tiempo es seguro almacenar leche materna. El sacaleche es un aparato que le permite extraer leche de la mama a un recipiente estril. Luego, la leche materna extrada puede almacenarse en un refrigerador o congelador. Algunos sacaleches son manuales, mientras que otros son elctricos. Consulte a su asesor en lactancia qu tipo ser ms conveniente para usted. Los sacaleches se pueden comprar; sin embargo, algunos hospitales y grupos de apoyo a la lactancia materna alquilan sacaleches mensualmente. Un asesor en lactancia puede ensearle cmo extraer leche materna manualmente, en caso de que prefiera no usar un sacaleche. CMO CUIDAR LAS MAMAS DURANTE LA LACTANCIA MATERNA Los pezones se secan, agrietan y duelen durante la lactancia materna. Las siguientes  recomendaciones pueden ayudarla a mantener las mamas humectadas y sanas:  Evite usar jabn en los pezones.  Use un sostn de soporte. Aunque no son esenciales, las camisetas sin mangas o los sostenes especiales para amamantar estn diseados para acceder fcilmente a las mamas, para amamantar sin tener que quitarse todo el sostn o la camiseta. Evite usar sostenes con aro o sostenes muy ajustados.  Seque al aire sus pezones durante 3 a 4minutos despus de amamantar al beb.  Utilice solo apsitos de algodn en el sostn para absorber las prdidas de leche. La prdida de un poco de leche materna entre las tomas es normal.  Utilice lanolina sobre los pezones luego de amamantar. La lanolina ayuda a mantener la humedad normal de la piel. Si usa lanolina pura, no tiene que lavarse los pezones antes de volver a alimentar al beb. La lanolina pura no es txica para el beb. Adems, puede extraer manualmente algunas gotas de leche materna y masajear suavemente esa leche sobre los pezones, para que la leche se seque al aire. Durante las primeras semanas despus de dar a luz, algunas mujeres pueden experimentar hinchazn en las mamas (congestin mamaria). La congestin puede hacer que sienta las mamas pesadas, calientes y sensibles al tacto. El pico de la congestin ocurre dentro de los 3 a 5 das despus del parto. Las siguientes recomendaciones pueden ayudarla a aliviar la congestin:  Vace por completo las mamas al amamantar o extraer leche. Puede aplicar calor hmedo en las mamas (  en la ducha o con toallas hmedas para manos) antes de amamantar o extraer leche. Esto aumenta la circulacin y ayuda a que la leche fluya. Si el beb no vaca por completo las mamas cuando lo amamanta, extraiga la leche restante despus de que haya finalizado.  Use un sostn ajustado (para amamantar o comn) o una camiseta sin mangas durante 1 o 2 das para indicar al cuerpo que disminuya ligeramente la produccin de  leche.  Aplique compresas de hielo sobre las mamas, a menos que le resulte demasiado incmodo.  Asegrese de que el beb est prendido y se encuentre en la posicin correcta mientras lo alimenta. Si la congestin persiste luego de 48 horas o despus de seguir estas recomendaciones, comunquese con su mdico o un asesor en lactancia. RECOMENDACIONES GENERALES PARA EL CUIDADO DE LA SALUD DURANTE LA LACTANCIA MATERNA  Consuma alimentos saludables. Alterne comidas y colaciones, y coma 3 de cada una por da. Dado que lo que come afecta la leche materna, es posible que algunas comidas hagan que su beb se vuelva ms irritable de lo habitual. Evite comer este tipo de alimentos si percibe que afectan de manera negativa al beb.  Beba leche, jugos de fruta y agua para satisfacer su sed (aproximadamente 10 vasos al da).  Descanse con frecuencia, reljese y tome sus vitaminas prenatales para evitar la fatiga, el estrs y la anemia.  Contine con los autocontroles de la mama.  Evite masticar y fumar tabaco. Las sustancias qumicas de los cigarrillos que pasan a la leche materna y la exposicin al humo ambiental del tabaco pueden daar al beb.  No consuma alcohol ni drogas, incluida la marihuana. Algunos medicamentos, que pueden ser perjudiciales para el beb, pueden pasar a travs de la leche materna. Es importante que consulte a su mdico antes de tomar cualquier medicamento, incluidos todos los medicamentos recetados y de venta libre, as como los suplementos vitamnicos y herbales. Puede quedar embarazada durante la lactancia. Si desea controlar la natalidad, consulte a su mdico cules son las opciones ms seguras para el beb. SOLICITE ATENCIN MDICA SI:  Usted siente que quiere dejar de amamantar o se siente frustrada con la lactancia.  Siente dolor en las mamas o en los pezones.  Sus pezones estn agrietados o sangran.  Sus pechos estn irritados, sensibles o calientes.  Tiene un rea  hinchada en cualquiera de las mamas.  Siente escalofros o fiebre.  Tiene nuseas o vmitos.  Presenta una secrecin de otro lquido distinto de la leche materna de los pezones.  Sus mamas no se llenan antes de amamantar al beb para el quinto da despus del parto.  Se siente triste y deprimida.  El beb est demasiado somnoliento como para comer bien.  El beb tiene problemas para dormir.  Moja menos de 3 paales en 24 horas.  Defeca menos de 3 veces en 24 horas.  La piel del beb o la parte blanca de los ojos se vuelven amarillentas.  El beb no ha aumentado de peso a los 5 das de vida.  SOLICITE ATENCIN MDICA DE INMEDIATO SI:  El beb est muy cansado (letargo) y no se quiere despertar para comer.  Le sube la fiebre sin causa.  Esta informacin no tiene como fin reemplazar el consejo del mdico. Asegrese de hacerle al mdico cualquier pregunta que tenga. Document Released: 08/11/2005 Document Revised: 12/03/2015 Document Reviewed: 02/02/2013 Elsevier Interactive Patient Education  2017 [employer]  

## 2017-07-14 ENCOUNTER — Ambulatory Visit (HOSPITAL_COMMUNITY)
Admission: RE | Admit: 2017-07-14 | Discharge: 2017-07-14 | Disposition: A | Discharge status: Home / Self Care | Payer: Self-pay | Source: Ambulatory Visit | Attending: Obstetrics and Gynecology | Admitting: Obstetrics and Gynecology

## 2017-07-14 ENCOUNTER — Encounter (HOSPITAL_COMMUNITY): Payer: Self-pay

## 2017-07-14 ENCOUNTER — Other Ambulatory Visit (HOSPITAL_COMMUNITY): Payer: Self-pay | Admitting: *Deleted

## 2017-07-14 ENCOUNTER — Other Ambulatory Visit (HOSPITAL_COMMUNITY): Payer: Self-pay | Admitting: Maternal and Fetal Medicine

## 2017-07-14 DIAGNOSIS — Z3A29 29 weeks gestation of pregnancy: Secondary | ICD-10-CM | POA: Insufficient documentation

## 2017-07-14 DIAGNOSIS — IMO0002 Reserved for concepts with insufficient information to code with codable children: Secondary | ICD-10-CM

## 2017-07-14 DIAGNOSIS — O24119 Pre-existing diabetes mellitus, type 2, in pregnancy, unspecified trimester: Secondary | ICD-10-CM

## 2017-07-14 DIAGNOSIS — O09523 Supervision of elderly multigravida, third trimester: Secondary | ICD-10-CM

## 2017-07-14 DIAGNOSIS — O289 Unspecified abnormal findings on antenatal screening of mother: Secondary | ICD-10-CM | POA: Insufficient documentation

## 2017-07-14 DIAGNOSIS — O24113 Pre-existing diabetes mellitus, type 2, in pregnancy, third trimester: Secondary | ICD-10-CM | POA: Insufficient documentation

## 2017-07-14 DIAGNOSIS — Z794 Long term (current) use of insulin: Principal | ICD-10-CM

## 2017-07-14 DIAGNOSIS — O09293 Supervision of pregnancy with other poor reproductive or obstetric history, third trimester: Secondary | ICD-10-CM | POA: Insufficient documentation

## 2017-07-14 DIAGNOSIS — Z0489 Encounter for examination and observation for other specified reasons: Secondary | ICD-10-CM

## 2017-07-14 DIAGNOSIS — Z362 Encounter for other antenatal screening follow-up: Secondary | ICD-10-CM | POA: Insufficient documentation

## 2017-07-27 ENCOUNTER — Encounter: Payer: Self-pay | Admitting: Obstetrics and Gynecology

## 2017-07-27 ENCOUNTER — Ambulatory Visit (INDEPENDENT_AMBULATORY_CARE_PROVIDER_SITE_OTHER): Payer: Self-pay | Admitting: Obstetrics and Gynecology

## 2017-07-27 VITALS — BP 103/69 | HR 106 | Wt 147.1 lb

## 2017-07-27 DIAGNOSIS — O09523 Supervision of elderly multigravida, third trimester: Secondary | ICD-10-CM

## 2017-07-27 DIAGNOSIS — O1213 Gestational proteinuria, third trimester: Secondary | ICD-10-CM

## 2017-07-27 DIAGNOSIS — O0993 Supervision of high risk pregnancy, unspecified, third trimester: Secondary | ICD-10-CM

## 2017-07-27 DIAGNOSIS — O0990 Supervision of high risk pregnancy, unspecified, unspecified trimester: Secondary | ICD-10-CM

## 2017-07-27 DIAGNOSIS — O24919 Unspecified diabetes mellitus in pregnancy, unspecified trimester: Secondary | ICD-10-CM

## 2017-07-27 DIAGNOSIS — O24913 Unspecified diabetes mellitus in pregnancy, third trimester: Secondary | ICD-10-CM

## 2017-07-27 DIAGNOSIS — O1212 Gestational proteinuria, second trimester: Secondary | ICD-10-CM

## 2017-07-27 DIAGNOSIS — O09522 Supervision of elderly multigravida, second trimester: Secondary | ICD-10-CM

## 2017-07-27 LAB — POCT URINALYSIS DIP (DEVICE)
BILIRUBIN URINE: NEGATIVE
GLUCOSE, UA: NEGATIVE mg/dL
Hgb urine dipstick: NEGATIVE
KETONES UR: NEGATIVE mg/dL
NITRITE: NEGATIVE
PROTEIN: NEGATIVE mg/dL
Specific Gravity, Urine: 1.02 (ref 1.005–1.030)
Urobilinogen, UA: 1 mg/dL (ref 0.0–1.0)
pH: 7 (ref 5.0–8.0)

## 2017-07-27 NOTE — Progress Notes (Signed)
   PRENATAL VISIT NOTE  Subjective:  Michele Morales is a 41 y.o. G7P6006 at 5163w0d being seen today for ongoing prenatal care.  She is currently monitored for the following issues for this high-risk pregnancy and has Supervision of high risk pregnancy, antepartum; Diabetes mellitus affecting pregnancy, antepartum; Advanced maternal age in multigravida, second trimester; Language barrier; Proteinuria affecting pregnancy in second trimester; and Grand multipara on their problem list.  Patient reports occasional mild contractions.  Contractions: Irritability. Vag. Bleeding: None.  Movement: Present. Denies leaking of fluid.   Insulin NPH  Am 26, 16 units QHS Regular 13 morning and 10 with dinner  FG: all < 95 PP: mostly < 120, occasional 130  The following portions of the patient's history were reviewed and updated as appropriate: allergies, current medications, past family history, past medical history, past social history, past surgical history and problem list. Problem list updated.  Objective:   Vitals:   07/27/17 0844  BP: 103/69  Pulse: (!) 106  Weight: 147 lb 1.6 oz (66.7 kg)    Fetal Status: Fetal Heart Rate (bpm): 161   Movement: Present     General:  Alert, oriented and cooperative. Patient is in no acute distress.  Skin: Skin is warm and dry. No rash noted.   Cardiovascular: Normal heart rate noted  Respiratory: Normal respiratory effort, no problems with respiration noted  Abdomen: Soft, gravid, appropriate for gestational age.  Pain/Pressure: Absent     Pelvic: Cervical exam deferred        Extremities: Normal range of motion.  Edema: Trace  Mental Status:  Normal mood and affect. Normal behavior. Normal judgment and thought content.   Assessment and Plan:  Pregnancy: G7P6006 at 6463w0d  1. Diabetes mellitus affecting pregnancy, antepartum NPH 26 units with breakfast, 16 units QHS Regular 13 units breakfast, 10 units with dinner Cont baby ASA Start weekly NST/BPP  next week  2. Supervision of high risk pregnancy, antepartum  3. Advanced maternal age in multigravida, second trimester abnl quad and declined further testing.   Preterm labor symptoms and general obstetric precautions including but not limited to vaginal bleeding, contractions, leaking of fluid and fetal movement were reviewed in detail with the patient. Please refer to After Visit Summary for other counseling recommendations.  Return in about 2 weeks (around 08/10/2017) for OB visit (MD).   Conan BowensKelly M Derik Fults, MD

## 2017-07-27 NOTE — Progress Notes (Signed)
Spanish video interpreter "Annice PihJackie" # [phone] used for visit

## 2017-07-27 NOTE — Patient Instructions (Signed)
You may take Zantac (ranitidine) for heartburn.

## 2017-08-03 ENCOUNTER — Encounter: Payer: Self-pay | Admitting: Obstetrics and Gynecology

## 2017-08-03 ENCOUNTER — Other Ambulatory Visit: Payer: Self-pay

## 2017-08-06 ENCOUNTER — Other Ambulatory Visit: Payer: Self-pay

## 2017-08-06 ENCOUNTER — Telehealth: Payer: Self-pay | Admitting: *Deleted

## 2017-08-06 ENCOUNTER — Encounter: Payer: Self-pay | Admitting: Obstetrics and Gynecology

## 2017-08-06 DIAGNOSIS — O09523 Supervision of elderly multigravida, third trimester: Secondary | ICD-10-CM

## 2017-08-06 DIAGNOSIS — O24119 Pre-existing diabetes mellitus, type 2, in pregnancy, unspecified trimester: Secondary | ICD-10-CM

## 2017-08-06 NOTE — Telephone Encounter (Signed)
Called pt w/[place] interpreter Aniceto Bosslma [phone]#[id number] regarding her missed appt today @ 1440. Pt stated that her appt was on 12/10 however the office was closed due to snow. Pt stated that she was not notified that the appt had been rescheduled.  She confirmed that the baby is moving well every Solara Goodchild. She is taking her medications and checking her blood sugar as instructed. She has no complaints or problems @ present. Pt was then advised that she should come to the office after completion of ultrasound on 12/18 for prenatal visit and NST.  Pt agreed and voiced understanding. BPP ordered to be added to US appt on 12/18 per guideline.

## 2017-08-11 ENCOUNTER — Encounter (HOSPITAL_COMMUNITY): Payer: Self-pay

## 2017-08-11 ENCOUNTER — Ambulatory Visit (HOSPITAL_COMMUNITY)
Admission: RE | Admit: 2017-08-11 | Discharge: 2017-08-11 | Disposition: A | Discharge status: Home / Self Care | Payer: Self-pay | Source: Ambulatory Visit | Attending: Obstetrics & Gynecology | Admitting: Obstetrics & Gynecology

## 2017-08-11 ENCOUNTER — Other Ambulatory Visit: Payer: Self-pay

## 2017-08-11 ENCOUNTER — Encounter: Payer: Self-pay | Admitting: Obstetrics & Gynecology

## 2017-08-11 DIAGNOSIS — O24119 Pre-existing diabetes mellitus, type 2, in pregnancy, unspecified trimester: Secondary | ICD-10-CM | POA: Insufficient documentation

## 2017-08-11 DIAGNOSIS — Z641 Problems related to multiparity: Secondary | ICD-10-CM

## 2017-08-11 DIAGNOSIS — O09523 Supervision of elderly multigravida, third trimester: Secondary | ICD-10-CM | POA: Insufficient documentation

## 2017-08-11 DIAGNOSIS — Z3A33 33 weeks gestation of pregnancy: Secondary | ICD-10-CM | POA: Insufficient documentation

## 2017-08-12 ENCOUNTER — Other Ambulatory Visit: Payer: Self-pay | Admitting: *Deleted

## 2017-08-12 DIAGNOSIS — O24119 Pre-existing diabetes mellitus, type 2, in pregnancy, unspecified trimester: Secondary | ICD-10-CM

## 2017-08-13 ENCOUNTER — Ambulatory Visit: Payer: Self-pay

## 2017-08-13 ENCOUNTER — Ambulatory Visit (INDEPENDENT_AMBULATORY_CARE_PROVIDER_SITE_OTHER): Payer: Self-pay | Admitting: Obstetrics and Gynecology

## 2017-08-13 ENCOUNTER — Ambulatory Visit (INDEPENDENT_AMBULATORY_CARE_PROVIDER_SITE_OTHER): Payer: Self-pay | Admitting: *Deleted

## 2017-08-13 VITALS — BP 124/73 | HR 108 | Wt 151.1 lb

## 2017-08-13 DIAGNOSIS — O0990 Supervision of high risk pregnancy, unspecified, unspecified trimester: Secondary | ICD-10-CM

## 2017-08-13 DIAGNOSIS — O24113 Pre-existing diabetes mellitus, type 2, in pregnancy, third trimester: Secondary | ICD-10-CM

## 2017-08-13 DIAGNOSIS — O09523 Supervision of elderly multigravida, third trimester: Secondary | ICD-10-CM

## 2017-08-13 DIAGNOSIS — O09522 Supervision of elderly multigravida, second trimester: Secondary | ICD-10-CM

## 2017-08-13 DIAGNOSIS — Z789 Other specified health status: Secondary | ICD-10-CM

## 2017-08-13 DIAGNOSIS — Z603 Acculturation difficulty: Secondary | ICD-10-CM

## 2017-08-13 DIAGNOSIS — O24919 Unspecified diabetes mellitus in pregnancy, unspecified trimester: Secondary | ICD-10-CM

## 2017-08-13 DIAGNOSIS — O24119 Pre-existing diabetes mellitus, type 2, in pregnancy, unspecified trimester: Secondary | ICD-10-CM

## 2017-08-13 DIAGNOSIS — O0993 Supervision of high risk pregnancy, unspecified, third trimester: Secondary | ICD-10-CM

## 2017-08-13 DIAGNOSIS — O24913 Unspecified diabetes mellitus in pregnancy, third trimester: Secondary | ICD-10-CM

## 2017-08-13 MED ORDER — INSULIN REGULAR HUMAN 100 UNIT/ML IJ SOLN: INTRAMUSCULAR | 11 refills | Status: DC

## 2017-08-13 NOTE — Progress Notes (Signed)
Interpreter Michele Morales present for encounter.  US growth and BPP done 12/18.  Next BPP @ [facility] on 12/27

## 2017-08-13 NOTE — Progress Notes (Signed)
Prenatal Visit Note Date: 08/13/2017 Clinic: [facility]  Subjective:  Michele Morales is a 41 y.o. G7P6006 at 4787w3d being seen today for ongoing prenatal care.  She is currently monitored for the following issues for this high-risk pregnancy and has Supervision of high risk pregnancy, antepartum; Diabetes mellitus affecting pregnancy, antepartum; Advanced maternal age in multigravida, second trimester; Language barrier; Proteinuria affecting pregnancy in second trimester; and Grand multipara on their problem list.  Patient reports no complaints.   Contractions: Irregular. Vag. Bleeding: None.  Movement: Present. Denies leaking of fluid.   The following portions of the patient's history were reviewed and updated as appropriate: allergies, current medications, past family history, past medical history, past social history, past surgical history and problem list. Problem list updated.  Objective:   Vitals:   08/13/17 1114  BP: 124/73  Pulse: (!) 108  Weight: 151 lb 1.6 oz (68.5 kg)    Fetal Status: Fetal Heart Rate (bpm): NST   Movement: Present     General:  Alert, oriented and cooperative. Patient is in no acute distress.  Skin: Skin is warm and dry. No rash noted.   Cardiovascular: Normal heart rate noted  Respiratory: Normal respiratory effort, no problems with respiration noted  Abdomen: Soft, gravid, appropriate for gestational age. Pain/Pressure: Present     Pelvic:  Cervical exam deferred        Extremities: Normal range of motion.     Mental Status: Normal mood and affect. Normal behavior. Normal judgment and thought content.   Urinalysis:      Assessment and Plan:  Pregnancy: G7P6006 at 7687w3d  1. Supervision of high risk pregnancy, antepartum Routine care. D/w pt re: BC nv  2. Pre-existing type 2 diabetes mellitus during pregnancy, antepartum On nph 26/16 and regular 13/10. Normal AM fasting and 2hr PP dinner with BS in the 120s-130s for 2hr PP  breakfast and lunch. Recommend increasing regular from 13 to 16. rNST today and has bpp and rpt growth next week  3. Advanced maternal age in multigravida, third trimester Declined testing  4. Language barrier Interpreter used   Preterm labor symptoms and general obstetric precautions including but not limited to vaginal bleeding, contractions, leaking of fluid and fetal movement were reviewed in detail with the patient. Please refer to After Visit Summary for other counseling recommendations.  Return in about 2 weeks (around 08/26/2017) for 1/2 or 1/3 NST/BPP and HOB.   [place] BingPickens, Emmah Bratcher, MD

## 2017-08-13 NOTE — Progress Notes (Signed)
Pt had BPP on 12/18.  NST only needed today.

## 2017-08-20 ENCOUNTER — Ambulatory Visit (HOSPITAL_COMMUNITY)
Admission: RE | Admit: 2017-08-20 | Discharge: 2017-08-20 | Disposition: A | Discharge status: Home / Self Care | Payer: Self-pay | Source: Ambulatory Visit | Attending: Obstetrics and Gynecology | Admitting: Obstetrics and Gynecology

## 2017-08-20 DIAGNOSIS — O09523 Supervision of elderly multigravida, third trimester: Secondary | ICD-10-CM | POA: Insufficient documentation

## 2017-08-20 DIAGNOSIS — O24113 Pre-existing diabetes mellitus, type 2, in pregnancy, third trimester: Secondary | ICD-10-CM | POA: Insufficient documentation

## 2017-08-20 DIAGNOSIS — O24119 Pre-existing diabetes mellitus, type 2, in pregnancy, unspecified trimester: Secondary | ICD-10-CM

## 2017-08-20 DIAGNOSIS — Z3A34 34 weeks gestation of pregnancy: Secondary | ICD-10-CM | POA: Insufficient documentation

## 2017-08-20 DIAGNOSIS — O289 Unspecified abnormal findings on antenatal screening of mother: Secondary | ICD-10-CM | POA: Insufficient documentation

## 2017-08-20 DIAGNOSIS — O09293 Supervision of pregnancy with other poor reproductive or obstetric history, third trimester: Secondary | ICD-10-CM | POA: Insufficient documentation

## 2017-08-25 NOTE — L&D Delivery Note (Signed)
Delivery Note At 12:40 AM a viable female was delivered via Vaginal, Spontaneous (Presentation: vertex; OA) delivery over an intact perineum.  APGAR: 8, 9; weight  .  Placenta was delivered shortly after without incident. Placenta status: intact, marginal insertion.  Cord: three vessel without complications.  Cord pH: not sent  Anesthesia:  n/a Episiotomy: None Lacerations: 2nd Suture Repair: 3.0 monocryl Est. Blood Loss (mL): 300   Mom to postpartum.  Baby to Couplet care / Skin to Skin.  Alroy Bailiffarker W Leland 09/10/2017, 1:16 AM  I confirm that I have verified the information documented in the resident's note and that I have also personally reperformed the physical exam and all medical decision making activities.  I was gloved and present for entire delivery SVD without incident No difficulty with shoulders Lacerations as listed above Repair of same supervised by me Aviva SignsMarie L Bobbijo Holst, CNM  Please schedule this patient for Postpartum visit in: 4 weeks with the following provider: Any provider For C/S patients schedule nurse incision check in weeks 2 weeks: no High risk pregnancy complicated by: GDM Delivery mode:  SVD Anticipated Birth Control:  other/unsure PP Procedures needed: BP check  And 2 hr GTT Schedule Integrated BH visit: no

## 2017-08-26 ENCOUNTER — Ambulatory Visit (INDEPENDENT_AMBULATORY_CARE_PROVIDER_SITE_OTHER): Payer: Self-pay | Admitting: *Deleted

## 2017-08-26 ENCOUNTER — Encounter: Payer: Self-pay | Admitting: Obstetrics and Gynecology

## 2017-08-26 ENCOUNTER — Ambulatory Visit: Payer: Self-pay

## 2017-08-26 ENCOUNTER — Ambulatory Visit (INDEPENDENT_AMBULATORY_CARE_PROVIDER_SITE_OTHER): Payer: Self-pay | Admitting: Obstetrics and Gynecology

## 2017-08-26 VITALS — BP 123/77 | HR 112 | Wt 155.0 lb

## 2017-08-26 DIAGNOSIS — O0990 Supervision of high risk pregnancy, unspecified, unspecified trimester: Secondary | ICD-10-CM

## 2017-08-26 DIAGNOSIS — O09523 Supervision of elderly multigravida, third trimester: Secondary | ICD-10-CM

## 2017-08-26 DIAGNOSIS — O24119 Pre-existing diabetes mellitus, type 2, in pregnancy, unspecified trimester: Secondary | ICD-10-CM

## 2017-08-26 DIAGNOSIS — O24113 Pre-existing diabetes mellitus, type 2, in pregnancy, third trimester: Secondary | ICD-10-CM

## 2017-08-26 DIAGNOSIS — Z758 Other problems related to medical facilities and other health care: Secondary | ICD-10-CM

## 2017-08-26 DIAGNOSIS — Z641 Problems related to multiparity: Secondary | ICD-10-CM

## 2017-08-26 DIAGNOSIS — O09522 Supervision of elderly multigravida, second trimester: Secondary | ICD-10-CM

## 2017-08-26 DIAGNOSIS — O24919 Unspecified diabetes mellitus in pregnancy, unspecified trimester: Secondary | ICD-10-CM

## 2017-08-26 DIAGNOSIS — Z789 Other specified health status: Secondary | ICD-10-CM

## 2017-08-26 NOTE — Progress Notes (Signed)
Video interpreter Dionisio DavidSylvina [phone] used for encounter.  US for growth/BPP scheduled 09/08/17.

## 2017-08-26 NOTE — Patient Instructions (Signed)
Eleccin del mtodo anticonceptivo Contraception Choices La anticoncepcin, o los mtodos anticonceptivos, hace referencia a los mtodos o dispositivos que evitan el [place]. Mtodos hormonales Implante anticonceptivo Un implante anticonceptivo consiste en un tubo plstico delgado que contiene una hormona. Se inserta en la parte superior del brazo. Puede Consulting civil engineer por 3 aos. Inyecciones de progestina sola Las inyecciones de progestina sola contienen progestina, una forma sinttica de la hormona progesterona. Un mdico las administra cada 3 meses. Pldoras anticonceptivas Las pldoras anticonceptivas son pastillas que contienen hormonas que evitan el [place]. Deben tomarse una vez al da, preferentemente a la misma Economist. Parches anticonceptivos El parche anticonceptivo contiene hormonas que evitan el [place]. Se coloca en la piel, debe cambiarse una vez a la semana durante tres semanas y debe retirarse en la cuarta semana. Se necesita una receta para utilizar este mtodo anticonceptivo. Anillo vaginal Un anillo vaginal contiene hormonas que evitan el embarazo. Se coloca en la vagina durante tres semanas y se retira en la cuarta semana. Luego se repite el proceso con un anillo nuevo. Se necesita una receta para utilizar este mtodo anticonceptivo. Anticonceptivo de Associate Professor Los anticonceptivos de emergencia evitan el embarazo despus de Warehouse manager sexo sin proteccin. Vienen en forma de pldora y pueden tomarse hasta 5 das despus de [place]. Funcionan mejor cuando se toman lo ms pronto posible luego de [employer]. La mayora de los anticonceptivos de emergencia estn disponibles sin receta mdica. Este mtodo no debe utilizarse como el nico mtodo anticonceptivo. Mtodos de barrera Preservativo masculino Un preservativo masculino es una vaina delgada que se coloca sobre el pene durante el sexo. Los preservativos evitan que el esperma ingrese en el cuerpo de la  [place]. Pueden utilizarse con un espermicida para aumentar la efectividad. Deben desecharse luego de su uso. Preservativo femenino Un preservativo femenino es una vaina blanda y holgada que se coloca en la vagina antes de [place]. El preservativo evita que el esperma ingrese en el cuerpo de la [place]. Deben desecharse luego de su uso. Diafragma Un diafragma es una barrera blanda con forma de cpula. Se inserta en la vagina antes del sexo, junto con un espermicida. El diafragma bloquea el ingreso de esperma en el tero, y el espermicida mata a los espermatozoides. El Designer, fashion/clothing en la vagina durante 6 a 8 horas despus de Warehouse manager sexo y debe retirarse en el plazo de las 24 horas. Un diafragma es recetado y colocado por un mdico. Debe reemplazarse cada 1 a 2 aos, despus de dar a luz, de aumentar ms de 15lb (6.8kg) y de [place] plvica. Capuchn cervical Un capuchn cervical es una copa redonda y blanda de ltex o plstico que se coloca en el cuello uterino. Se inserta en la vagina antes del sexo, junto con un espermicida. Bloquea el ingreso del esperma en el tero. El capuchn Radio producer durante 6 a 8 horas despus de Warehouse manager sexo y debe retirarse en el plazo de las 48 horas. Un capuchn cervical debe ser recetado y colocado por un mdico. Debe reemplazarse cada 2aos. Esponja Una esponja es una pieza blanda y circular de espuma de poliuretano que contiene espermicida. La esponja ayuda a bloquear el ingreso de esperma en el tero, y el espermicida mata a los espermatozoides. Belva Bertin, debe humedecerla e insertarla en la vagina. Debe insertarse antes de [employer], debe permanecer dentro al menos durante 6 horas despus de [place] sexo y debe retirarse y Nurse, adult en  el plazo de las 30 horas. Espermicidas Los espermicidas son sustancias qumicas que matan o bloquean al esperma y no lo dejan ingresar al cuello uterino y al tero. Vienen en forma de crema, gel,  supositorio, espuma o comprimido. Un espermicida debe insertarse en la vagina con un aplicador al menos 10 o 15 minutos antes de tener sexo para dar tiempo a que surta efecto. El proceso debe repetirse cada vez que tenga sexo. Los espermicidas no requieren receta mdica. Anticonceptivos intrauterinos Dispositivo intrauterino (DIU). Un DIU un dispositivo en forma de T que se coloca en el tero. Hay dos tipos:  DIU hormonal. Este tipo contiene progestina, una forma sinttica de la hormona progesterona. Este tipo puede permanecer colocado durante 3 a 5 aos.  DIU de cobre. Este tipo est recubierto con un alambre de cobre. Puede permanecer colocado durante 10 aos.  Mtodos anticonceptivos permanentes Ligadura de trompas en la mujer En este mtodo, se sellan, atan u obstruyen las trompas de Falopio durante una ciruga para evitar que el vulo descienda hacia el tero. Esterilizacin histeroscpica En este mtodo, se coloca un implante pequeo y flexible dentro de cada trompa de Falopio. Los implantes hacen que se forme un tejido cicatricial en las trompas de Falopio y que las obstruya para que el espermatozoide no pueda llegar al vulo. El procedimiento demora alrededor de 3 meses para que sea efectivo. Debe utilizarse otro mtodo anticonceptivo durante esos 3 meses. Esterilizacin masculina Este es un procedimiento que consiste en atar los conductos que transportan el esperma (vasectoma). Luego del procedimiento, el hombre puede eyacular lquido (semen). Mtodos de planificacin natural Planificacin familiar natural En este mtodo, la pareja no tiene sexo durante los das en que la mujer podra quedar embarazada. Mtodo calendario Esto significa realizar un seguimiento de la duracin de cada ciclo menstrual, identificar los das en los que se puede producir un embarazo y no tener sexo durante esos das. Mtodo de la ovulacin En este mtodo, la pareja evita tener sexo durante la  ovulacin. Mtodo sintotrmico Este mtodo implica no tener sexo durante la ovulacin. Normalmente, la mujer comprueba la ovulacin al observar cambios en su temperatura y en la consistencia del moco cervical. Mtodo posovulacin En este mtodo, la pareja espera a que finalice la ovulacin para tener sexo. Resumen  La anticoncepcin, o los mtodos anticonceptivos, hace referencia a los mtodos o dispositivos que evitan el embarazo.  Los mtodos anticonceptivos hormonales incluyen implantes, inyecciones, pastillas, parches, anillos vaginales y anticonceptivos de emergencia.  Los mtodos anticonceptivos de barrera pueden incluir preservativos masculinos, preservativos femeninos, diafragmas, capuchones cervicales, esponjas y espermicidas.  Hay dos tipos de DIU (dispositivos intrauterinos). Un DIU puede colocarse en el tero de una mujer para evitar el embarazo durante 3 a 5 aos.  La esterilizacin permanente puede realizarse mediante un procedimiento para hombres, mujeres o ambos.  Los mtodos de planificacin familiar natural incluyen no tener sexo durante los das en que la mujer podra quedar embarazada. Esta informacin no tiene como fin reemplazar el consejo del mdico. Asegrese de hacerle al mdico cualquier pregunta que tenga. Document Released: 08/11/2005 Document Revised: 12/01/2016 Document Reviewed: 12/01/2016 Elsevier Interactive Patient Education  2018 Elsevier Inc.  

## 2017-08-26 NOTE — Progress Notes (Signed)

## 2017-08-26 NOTE — Progress Notes (Signed)
   PRENATAL VISIT NOTE  Subjective:  Michele Morales is a 42 y.o. G7P6006 at 564w2d being seen today for ongoing prenatal care.  She is currently monitored for the following issues for this high-risk pregnancy and has Supervision of high risk pregnancy, antepartum; Diabetes mellitus affecting pregnancy, antepartum; Advanced maternal age in multigravida, second trimester; Language barrier; Proteinuria affecting pregnancy in second trimester; and Grand multipara on their problem list.  Patient reports occasional contractions.  Contractions: Irregular. Vag. Bleeding: None.  Movement: Present. Denies leaking of fluid.   T2DM on insulin NPH 26/16 Regular 16/10 FG: < 95 PP: <125  The following portions of the patient's history were reviewed and updated as appropriate: allergies, current medications, past family history, past medical history, past social history, past surgical history and problem list. Problem list updated.  Objective:   Vitals:   08/26/17 1559  BP: 123/77  Pulse: (!) 112  Weight: 155 lb (70.3 kg)    Fetal Status: Fetal Heart Rate (bpm): NST   Movement: Present     General:  Alert, oriented and cooperative. Patient is in no acute distress.  Skin: Skin is warm and dry. No rash noted.   Cardiovascular: Normal heart rate noted  Respiratory: Normal respiratory effort, no problems with respiration noted  Abdomen: Soft, gravid, appropriate for gestational age.  Pain/Pressure: Present     Pelvic: Cervical exam deferred        Extremities: Normal range of motion.     Mental Status:  Normal mood and affect. Normal behavior. Normal judgment and thought content.   Assessment and Plan:  Pregnancy: G7P6006 at 5664w2d  1. Diabetes mellitus affecting pregnancy, antepartum - US MFM FETAL BPP WO NON STRESS; Future T2DM on insulin  NPH 26/16 Regular 13/10 Well controlled, cont current regimen Last growth 08/11/17 61st%tile BPP 10/10 today Cont weekly NST/BPP  2. Supervision of  high risk pregnancy, antepartum Undecided for birth control Gave info  3. Advanced maternal age in multigravida, second trimester Weekly NST - US MFM FETAL BPP WO NON STRESS; Future  4. Language barrier spanish  5. Grand multipara   Preterm labor symptoms and general obstetric precautions including but not limited to vaginal bleeding, contractions, leaking of fluid and fetal movement were reviewed in detail with the patient. Please refer to After Visit Summary for other counseling recommendations.  Return in about 1 week (around 09/02/2017) for OB visit (MD).   Conan BowensKelly M Davis, MD

## 2017-09-02 ENCOUNTER — Ambulatory Visit (INDEPENDENT_AMBULATORY_CARE_PROVIDER_SITE_OTHER): Payer: Self-pay | Admitting: *Deleted

## 2017-09-02 ENCOUNTER — Ambulatory Visit: Payer: Self-pay

## 2017-09-02 ENCOUNTER — Ambulatory Visit (INDEPENDENT_AMBULATORY_CARE_PROVIDER_SITE_OTHER): Payer: Self-pay | Admitting: Obstetrics and Gynecology

## 2017-09-02 VITALS — BP 108/74 | HR 115 | Wt 156.4 lb

## 2017-09-02 DIAGNOSIS — O09523 Supervision of elderly multigravida, third trimester: Secondary | ICD-10-CM

## 2017-09-02 DIAGNOSIS — O24919 Unspecified diabetes mellitus in pregnancy, unspecified trimester: Secondary | ICD-10-CM

## 2017-09-02 DIAGNOSIS — O24119 Pre-existing diabetes mellitus, type 2, in pregnancy, unspecified trimester: Secondary | ICD-10-CM

## 2017-09-02 DIAGNOSIS — Z113 Encounter for screening for infections with a predominantly sexual mode of transmission: Secondary | ICD-10-CM

## 2017-09-02 DIAGNOSIS — O0990 Supervision of high risk pregnancy, unspecified, unspecified trimester: Secondary | ICD-10-CM

## 2017-09-02 DIAGNOSIS — O09522 Supervision of elderly multigravida, second trimester: Secondary | ICD-10-CM

## 2017-09-02 LAB — POCT URINALYSIS DIP (DEVICE)
BILIRUBIN URINE: NEGATIVE
Glucose, UA: NEGATIVE mg/dL
Hgb urine dipstick: NEGATIVE
Ketones, ur: NEGATIVE mg/dL
NITRITE: NEGATIVE
PH: 6 (ref 5.0–8.0)
PROTEIN: NEGATIVE mg/dL
Specific Gravity, Urine: 1.01 (ref 1.005–1.030)
UROBILINOGEN UA: 1 mg/dL (ref 0.0–1.0)

## 2017-09-02 MED ORDER — INSULIN REGULAR HUMAN 100 UNIT/ML IJ SOLN: INTRAMUSCULAR | 11 refills | Status: DC

## 2017-09-02 NOTE — Progress Notes (Signed)
US for growth and BPP scheduled on 1/16.

## 2017-09-02 NOTE — Progress Notes (Signed)
Pt informed that the ultrasound is considered a limited OB ultrasound and is not intended to be a complete ultrasound exam.  Patient also informed that the ultrasound is not being completed with the intent of assessing for fetal or placental anomalies or any pelvic abnormalities.  Explained that the purpose of today's ultrasound is to assess for presentation, BPP and amniotic fluid volume.  Patient acknowledges the purpose of the exam and the limitations of the study.    Video interpreter Gillis SantaGabriela [phone] used for encounter.

## 2017-09-02 NOTE — Progress Notes (Signed)
Prenatal Visit Note Date: 09/02/2017 Clinic: [facility]  Subjective:  Michele Morales is a 42 y.o. G7P6006 at 5583w2d being seen today for ongoing prenatal care.  She is currently monitored for the following issues for this high-risk pregnancy and has Supervision of high risk pregnancy, antepartum; Diabetes mellitus affecting pregnancy, antepartum; Advanced maternal age in multigravida, second trimester; Language barrier; Proteinuria affecting pregnancy in second trimester; and Grand multipara on their problem list.  Patient reports no complaints.   Contractions: Irregular. Vag. Bleeding: None.  Movement: Present. Denies leaking of fluid.   The following portions of the patient's history were reviewed and updated as appropriate: allergies, current medications, past family history, past medical history, past social history, past surgical history and problem list. Problem list updated.  Objective:   Vitals:   09/02/17 1332  BP: 108/74  Pulse: (!) 115  Weight: 156 lb 6.4 oz (70.9 kg)    Fetal Status: Fetal Heart Rate (bpm): NST   Movement: Present     General:  Alert, oriented and cooperative. Patient is in no acute distress.  Skin: Skin is warm and dry. No rash noted.   Cardiovascular: Normal heart rate noted  Respiratory: Normal respiratory effort, no problems with respiration noted  Abdomen: Soft, gravid, appropriate for gestational age. Pain/Pressure: Present     Pelvic:  Cervical exam deferred        Extremities: Normal range of motion.     Mental Status: Normal mood and affect. Normal behavior. Normal judgment and thought content.   Urinalysis: Urine Protein: Negative Urine Glucose: Negative  Assessment and Plan:  Pregnancy: G7P6006 at 7283w2d  1. Supervision of high risk pregnancy, antepartum Routine care. D/w pt re: BC nv - GC[facility]lam[facility] - Strep Gp B NAA  2. Pre-existing type 2 diabetes mellitus during pregnancy,  antepartum 2hr lunch and dinner numbers in the 120s-130s mostly. Continue nph 26/16 but will increase regular from 16/10 to 18/10. rNST and reassuring BPP today. F/u growth and bpp next week  3. Advanced maternal age in multigravida, third trimester  4. Diabetes mellitus affecting pregnancy, antepartum  5. Advanced maternal age in multigravida, second trimester  Preterm labor symptoms and general obstetric precautions including but not limited to vaginal bleeding, contractions, leaking of fluid and fetal movement were reviewed in detail with the patient. Please refer to After Visit Summary for other counseling recommendations.  Return in about 7 days (around 09/09/2017) for as scheduled.   [place] BingPickens, Daichi Moris, MD

## 2017-09-02 NOTE — Patient Instructions (Signed)
Increase your morning regular insulin from 16 to 18 units with breakfast

## 2017-09-03 LAB — GC/CHLAMYDIA PROBE AMP (~~LOC~~) NOT AT ARMC
CHLAMYDIA, DNA PROBE: NEGATIVE
Neisseria Gonorrhea: NEGATIVE

## 2017-09-04 LAB — STREP GP B NAA: Strep Gp B NAA: NEGATIVE

## 2017-09-08 ENCOUNTER — Ambulatory Visit (HOSPITAL_COMMUNITY): Payer: Self-pay

## 2017-09-09 ENCOUNTER — Encounter: Payer: Self-pay | Admitting: Obstetrics and Gynecology

## 2017-09-09 ENCOUNTER — Ambulatory Visit (INDEPENDENT_AMBULATORY_CARE_PROVIDER_SITE_OTHER): Payer: Self-pay | Admitting: Obstetrics and Gynecology

## 2017-09-09 ENCOUNTER — Ambulatory Visit (HOSPITAL_COMMUNITY)
Admission: RE | Admit: 2017-09-09 | Discharge: 2017-09-09 | Disposition: A | Discharge status: Home / Self Care | Payer: Medicaid Other | Source: Ambulatory Visit | Attending: Obstetrics and Gynecology | Admitting: Obstetrics and Gynecology

## 2017-09-09 ENCOUNTER — Inpatient Hospital Stay (HOSPITAL_COMMUNITY)
Admission: AD | Admit: 2017-09-09 | Discharge: 2017-09-12 | DRG: 805 | Disposition: A | Discharge status: Home / Self Care | Payer: Medicaid Other | Source: Ambulatory Visit | Attending: Family Medicine | Admitting: Family Medicine

## 2017-09-09 ENCOUNTER — Ambulatory Visit (HOSPITAL_COMMUNITY): Payer: Self-pay

## 2017-09-09 ENCOUNTER — Other Ambulatory Visit: Payer: Self-pay

## 2017-09-09 ENCOUNTER — Encounter (HOSPITAL_COMMUNITY): Payer: Self-pay

## 2017-09-09 ENCOUNTER — Encounter (HOSPITAL_COMMUNITY): Payer: Self-pay | Admitting: *Deleted

## 2017-09-09 ENCOUNTER — Ambulatory Visit (INDEPENDENT_AMBULATORY_CARE_PROVIDER_SITE_OTHER): Payer: Self-pay | Admitting: *Deleted

## 2017-09-09 ENCOUNTER — Ambulatory Visit: Payer: Self-pay

## 2017-09-09 VITALS — BP 119/80 | HR 119 | Wt 158.9 lb

## 2017-09-09 DIAGNOSIS — O4290 Premature rupture of membranes, unspecified as to length of time between rupture and onset of labor, unspecified weeks of gestation: Secondary | ICD-10-CM | POA: Diagnosis present

## 2017-09-09 DIAGNOSIS — Z3A37 37 weeks gestation of pregnancy: Secondary | ICD-10-CM

## 2017-09-09 DIAGNOSIS — O24113 Pre-existing diabetes mellitus, type 2, in pregnancy, third trimester: Secondary | ICD-10-CM

## 2017-09-09 DIAGNOSIS — O24919 Unspecified diabetes mellitus in pregnancy, unspecified trimester: Secondary | ICD-10-CM

## 2017-09-09 DIAGNOSIS — O09523 Supervision of elderly multigravida, third trimester: Secondary | ICD-10-CM

## 2017-09-09 DIAGNOSIS — O4292 Full-term premature rupture of membranes, unspecified as to length of time between rupture and onset of labor: Principal | ICD-10-CM | POA: Diagnosis present

## 2017-09-09 DIAGNOSIS — E119 Type 2 diabetes mellitus without complications: Secondary | ICD-10-CM | POA: Diagnosis present

## 2017-09-09 DIAGNOSIS — Z641 Problems related to multiparity: Secondary | ICD-10-CM

## 2017-09-09 DIAGNOSIS — O2412 Pre-existing diabetes mellitus, type 2, in childbirth: Secondary | ICD-10-CM | POA: Diagnosis present

## 2017-09-09 DIAGNOSIS — O0990 Supervision of high risk pregnancy, unspecified, unspecified trimester: Secondary | ICD-10-CM

## 2017-09-09 DIAGNOSIS — O4202 Full-term premature rupture of membranes, onset of labor within 24 hours of rupture: Secondary | ICD-10-CM | POA: Diagnosis not present

## 2017-09-09 DIAGNOSIS — O09522 Supervision of elderly multigravida, second trimester: Secondary | ICD-10-CM

## 2017-09-09 DIAGNOSIS — O24119 Pre-existing diabetes mellitus, type 2, in pregnancy, unspecified trimester: Secondary | ICD-10-CM

## 2017-09-09 DIAGNOSIS — O1212 Gestational proteinuria, second trimester: Secondary | ICD-10-CM

## 2017-09-09 DIAGNOSIS — Z794 Long term (current) use of insulin: Secondary | ICD-10-CM | POA: Diagnosis not present

## 2017-09-09 DIAGNOSIS — O09293 Supervision of pregnancy with other poor reproductive or obstetric history, third trimester: Secondary | ICD-10-CM | POA: Insufficient documentation

## 2017-09-09 DIAGNOSIS — O281 Abnormal biochemical finding on antenatal screening of mother: Secondary | ICD-10-CM | POA: Diagnosis present

## 2017-09-09 LAB — CBC
HEMATOCRIT: 32.8 % — AB (ref 36.0–46.0)
HEMOGLOBIN: 11.1 g/dL — AB (ref 12.0–15.0)
MCH: 28.1 pg (ref 26.0–34.0)
MCHC: 33.8 g/dL (ref 30.0–36.0)
MCV: 83 fL (ref 78.0–100.0)
Platelets: 283 10*3/uL (ref 150–400)
RBC: 3.95 MIL/uL (ref 3.87–5.11)
RDW: 13.8 % (ref 11.5–15.5)
WBC: 12.6 10*3/uL — AB (ref 4.0–10.5)

## 2017-09-09 LAB — COMPREHENSIVE METABOLIC PANEL
ALBUMIN: 2.6 g/dL — AB (ref 3.5–5.0)
ALK PHOS: 146 U/L — AB (ref 38–126)
ALT: 13 U/L — ABNORMAL LOW (ref 14–54)
AST: 18 U/L (ref 15–41)
Anion gap: 7 (ref 5–15)
BUN: 15 mg/dL (ref 6–20)
CALCIUM: 8.5 mg/dL — AB (ref 8.9–10.3)
CO2: 18 mmol/L — AB (ref 22–32)
CREATININE: 0.46 mg/dL (ref 0.44–1.00)
Chloride: 109 mmol/L (ref 101–111)
GFR calc Af Amer: >60 mL/min (ref >60)
GFR calc non Af Amer: >60 mL/min (ref >60)
GLUCOSE: 61 mg/dL — AB (ref 65–99)
Potassium: 3.8 mmol/L (ref 3.5–5.1)
SODIUM: 134 mmol/L — AB (ref 135–145)
Total Bilirubin: 0.4 mg/dL (ref 0.3–1.2)
Total Protein: 6.1 g/dL — ABNORMAL LOW (ref 6.5–8.1)

## 2017-09-09 LAB — TYPE AND SCREEN
ABO/RH(D): O POS
Antibody Screen: NEGATIVE

## 2017-09-09 LAB — GLUCOSE, CAPILLARY: Glucose-Capillary: 40 mg/dL — CL (ref 65–99)

## 2017-09-09 LAB — POCT FERN TEST: POCT Fern Test: POSITIVE

## 2017-09-09 MED ORDER — FENTANYL CITRATE (PF) 100 MCG/2ML IJ SOLN: 100.0000 ug | INTRAMUSCULAR | Status: DC | PRN

## 2017-09-09 MED ORDER — MISOPROSTOL 25 MCG QUARTER TABLET
25.0000 ug | ORAL_TABLET | ORAL | Status: DC | PRN
  Filled 2017-09-09: qty 1

## 2017-09-09 MED ORDER — OXYTOCIN BOLUS FROM INFUSION
500.0000 mL | Freq: Once | INTRAVENOUS | Status: AC
  Administered 2017-09-10: 500 mL via INTRAVENOUS

## 2017-09-09 MED ORDER — LIDOCAINE HCL (PF) 1 % IJ SOLN: 30.0000 mL | INTRAMUSCULAR | Status: DC | PRN

## 2017-09-09 MED ORDER — ACETAMINOPHEN 325 MG PO TABS: 650.0000 mg | ORAL_TABLET | ORAL | Status: DC | PRN

## 2017-09-09 MED ORDER — OXYTOCIN BOLUS FROM INFUSION: 500.0000 mL | Freq: Once | INTRAVENOUS | Status: DC

## 2017-09-09 MED ORDER — LACTATED RINGERS IV SOLN: 500.0000 mL | INTRAVENOUS | Status: DC | PRN

## 2017-09-09 MED ORDER — TERBUTALINE SULFATE 1 MG/ML IJ SOLN
0.2500 mg | Freq: Once | INTRAMUSCULAR | Status: DC | PRN
  Filled 2017-09-09: qty 1

## 2017-09-09 MED ORDER — LACTATED RINGERS IV SOLN: INTRAVENOUS | Status: DC

## 2017-09-09 MED ORDER — OXYTOCIN 40 UNITS IN LACTATED RINGERS INFUSION - SIMPLE MED
2.5000 [IU]/h | INTRAVENOUS | Status: DC
  Administered 2017-09-10: 2.5 [IU]/h via INTRAVENOUS
  Filled 2017-09-09: qty 1000

## 2017-09-09 MED ORDER — SOD CITRATE-CITRIC ACID 500-334 MG/5ML PO SOLN: 30.0000 mL | ORAL | Status: DC | PRN

## 2017-09-09 MED ORDER — ONDANSETRON HCL 4 MG/2ML IJ SOLN: 4.0000 mg | Freq: Four times a day (QID) | INTRAMUSCULAR | Status: DC | PRN

## 2017-09-09 MED ORDER — OXYCODONE-ACETAMINOPHEN 5-325 MG PO TABS: 1.0000 | ORAL_TABLET | ORAL | Status: DC | PRN

## 2017-09-09 MED ORDER — OXYTOCIN 40 UNITS IN LACTATED RINGERS INFUSION - SIMPLE MED: 2.5000 [IU]/h | INTRAVENOUS | Status: DC

## 2017-09-09 MED ORDER — LACTATED RINGERS IV SOLN
INTRAVENOUS | Status: DC
  Administered 2017-09-09: 17:00:00 via INTRAVENOUS

## 2017-09-09 MED ORDER — LIDOCAINE HCL (PF) 1 % IJ SOLN
30.0000 mL | INTRAMUSCULAR | Status: AC | PRN
  Administered 2017-09-10: 30 mL via SUBCUTANEOUS
  Filled 2017-09-09: qty 30

## 2017-09-09 MED ORDER — MISOPROSTOL 50MCG HALF TABLET
50.0000 ug | ORAL_TABLET | ORAL | Status: DC
  Administered 2017-09-09: 50 ug via ORAL
  Filled 2017-09-09 (×4): qty 1

## 2017-09-09 MED ORDER — OXYCODONE-ACETAMINOPHEN 5-325 MG PO TABS: 2.0000 | ORAL_TABLET | ORAL | Status: DC | PRN

## 2017-09-09 NOTE — MAU Note (Signed)
Pt presents with c/o LOF since 1500, reports fluids clear but blood tinged.  Reports regular ctxs that started this morning.  Reports +FM.

## 2017-09-09 NOTE — Addendum Note (Signed)
Encounter addended by: Drue NovelKeatts, Diarra Ceja J, RDMS on: 09/09/2017 12:32 PM  Actions taken: Imaging Exam ended

## 2017-09-09 NOTE — Progress Notes (Signed)
   PRENATAL VISIT NOTE  Subjective:  Michele Morales is a 42 y.o. G7P6006 at 1346w2d being seen today for ongoing prenatal care.  She is currently monitored for the following issues for this high-risk pregnancy and has Supervision of high risk pregnancy, antepartum; Diabetes mellitus affecting pregnancy, antepartum; Advanced maternal age in multigravida, second trimester; Language barrier; Proteinuria affecting pregnancy in second trimester; and Grand multipara on their problem list.  Patient reports vaginal pressure and occasional contractions.  Contractions: Irregular. Vag. Bleeding: None.  Movement: Present. Denies leaking of fluid.   T2DM on insulin, NPH 26/16, Regular 18/10 FG: 69-83 PP: 101-121, most <120  The following portions of the patient's history were reviewed and updated as appropriate: allergies, current medications, past family history, past medical history, past social history, past surgical history and problem list. Problem list updated.  Objective:   Vitals:   09/09/17 1318  BP: 119/80  Pulse: (!) 119  Weight: 158 lb 14.4 oz (72.1 kg)    Fetal Status: Fetal Heart Rate (bpm): NST   Movement: Present     General:  Alert, oriented and cooperative. Patient is in no acute distress.  Skin: Skin is warm and dry. No rash noted.   Cardiovascular: Normal heart rate noted  Respiratory: Normal respiratory effort, no problems with respiration noted  Abdomen: Soft, gravid, appropriate for gestational age.  Pain/Pressure: Present     Pelvic: Cervical exam deferred        Extremities: Normal range of motion.     Mental Status:  Normal mood and affect. Normal behavior. Normal judgment and thought content.   Assessment and Plan:  Pregnancy: G7P6006 at 6846w2d  1. Diabetes mellitus affecting pregnancy, antepartum Blood sugar well controlled Cont current regimen BPP/NST today 10/10 Repeat testing weekly IOL scheduled for 39 weeks  2. Supervision of high risk pregnancy,  antepartum IOL scheduled for 39 weeks  3. Advanced maternal age in multigravida, second trimester  4. Proteinuria affecting pregnancy in second trimester   Term labor symptoms and general obstetric precautions including but not limited to vaginal bleeding, contractions, leaking of fluid and fetal movement were reviewed in detail with the patient. Please refer to After Visit Summary for other counseling recommendations.  Return in about 1 week (around 09/16/2017) for as scheduled.   Conan BowensKelly M Davis, MD

## 2017-09-09 NOTE — Progress Notes (Signed)
Verified by u/S

## 2017-09-09 NOTE — Anesthesia Pain Management Evaluation Note (Signed)
  CRNA Pain Management Visit Note  Patient: Michele FlightMaria Morales, 42 y.o., female  "Hello I am a member of the anesthesia team at [facility]. We have an anesthesia team available at all times to provide care throughout the hospital, including epidural management and anesthesia for C-section. I don't know your plan for the delivery whether it a natural birth, water birth, IV sedation, nitrous supplementation, doula or epidural, but we want to meet your pain goals."   1.Was your pain managed to your expectations on prior hospitalizations?   Yes   2.What is your expectation for pain management during this hospitalization?     Epidural, IV pain meds and Nitrous Oxide  3.How can we help you reach that goal? Be available  Record the patient's initial score and the patient's pain goal.   Pain: 0  Pain Goal: 7 [facility] wants you to be able to say your pain was always managed very well.  [facility] LtdMERRITT,Shawne Bulow 09/09/2017

## 2017-09-09 NOTE — Progress Notes (Signed)
US for growth and BPP done @ [facility] today 

## 2017-09-09 NOTE — Patient Instructions (Signed)
Eleccin del mtodo anticonceptivo Contraception Choices La anticoncepcin, o los mtodos anticonceptivos, hace referencia a los mtodos o dispositivos que evitan el [place]. Mtodos hormonales Implante anticonceptivo Un implante anticonceptivo consiste en un tubo plstico delgado que contiene una hormona. Se inserta en la parte superior del brazo. Puede Consulting civil engineer por 3 aos. Inyecciones de progestina sola Las inyecciones de progestina sola contienen progestina, una forma sinttica de la hormona progesterona. Un mdico las administra cada 3 meses. Pldoras anticonceptivas Las pldoras anticonceptivas son pastillas que contienen hormonas que evitan el [place]. Deben tomarse una vez al da, preferentemente a la misma Economist. Parches anticonceptivos El parche anticonceptivo contiene hormonas que evitan el [place]. Se coloca en la piel, debe cambiarse una vez a la semana durante tres semanas y debe retirarse en la cuarta semana. Se necesita una receta para utilizar este mtodo anticonceptivo. Anillo vaginal Un anillo vaginal contiene hormonas que evitan el embarazo. Se coloca en la vagina durante tres semanas y se retira en la cuarta semana. Luego se repite el proceso con un anillo nuevo. Se necesita una receta para utilizar este mtodo anticonceptivo. Anticonceptivo de Associate Professor Los anticonceptivos de emergencia evitan el embarazo despus de Warehouse manager sexo sin proteccin. Vienen en forma de pldora y pueden tomarse hasta 5 das despus de [place]. Funcionan mejor cuando se toman lo ms pronto posible luego de [employer]. La mayora de los anticonceptivos de emergencia estn disponibles sin receta mdica. Este mtodo no debe utilizarse como el nico mtodo anticonceptivo. Mtodos de barrera Preservativo masculino Un preservativo masculino es una vaina delgada que se coloca sobre el pene durante el sexo. Los preservativos evitan que el esperma ingrese en el cuerpo de la  [place]. Pueden utilizarse con un espermicida para aumentar la efectividad. Deben desecharse luego de su uso. Preservativo femenino Un preservativo femenino es una vaina blanda y holgada que se coloca en la vagina antes de [place]. El preservativo evita que el esperma ingrese en el cuerpo de la [place]. Deben desecharse luego de su uso. Diafragma Un diafragma es una barrera blanda con forma de cpula. Se inserta en la vagina antes del sexo, junto con un espermicida. El diafragma bloquea el ingreso de esperma en el tero, y el espermicida mata a los espermatozoides. El Designer, fashion/clothing en la vagina durante 6 a 8 horas despus de Warehouse manager sexo y debe retirarse en el plazo de las 24 horas. Un diafragma es recetado y colocado por un mdico. Debe reemplazarse cada 1 a 2 aos, despus de dar a luz, de aumentar ms de 15lb (6.8kg) y de [place] plvica. Capuchn cervical Un capuchn cervical es una copa redonda y blanda de ltex o plstico que se coloca en el cuello uterino. Se inserta en la vagina antes del sexo, junto con un espermicida. Bloquea el ingreso del esperma en el tero. El capuchn Radio producer durante 6 a 8 horas despus de Warehouse manager sexo y debe retirarse en el plazo de las 48 horas. Un capuchn cervical debe ser recetado y colocado por un mdico. Debe reemplazarse cada 2aos. Esponja Una esponja es una pieza blanda y circular de espuma de poliuretano que contiene espermicida. La esponja ayuda a bloquear el ingreso de esperma en el tero, y el espermicida mata a los espermatozoides. Belva Bertin, debe humedecerla e insertarla en la vagina. Debe insertarse antes de [employer], debe permanecer dentro al menos durante 6 horas despus de [place] sexo y debe retirarse y Nurse, adult en  el plazo de las 30 horas. Espermicidas Los espermicidas son sustancias qumicas que matan o bloquean al esperma y no lo dejan ingresar al cuello uterino y al tero. Vienen en forma de crema, gel,  supositorio, espuma o comprimido. Un espermicida debe insertarse en la vagina con un aplicador al menos 10 o 15 minutos antes de tener sexo para dar tiempo a que surta efecto. El proceso debe repetirse cada vez que tenga sexo. Los espermicidas no requieren receta mdica. Anticonceptivos intrauterinos Dispositivo intrauterino (DIU). Un DIU un dispositivo en forma de T que se coloca en el tero. Hay dos tipos:  DIU hormonal. Este tipo contiene progestina, una forma sinttica de la hormona progesterona. Este tipo puede permanecer colocado durante 3 a 5 aos.  DIU de cobre. Este tipo est recubierto con un alambre de cobre. Puede permanecer colocado durante 10 aos.  Mtodos anticonceptivos permanentes Ligadura de trompas en la mujer En este mtodo, se sellan, atan u obstruyen las trompas de Falopio durante una ciruga para evitar que el vulo descienda hacia el tero. Esterilizacin histeroscpica En este mtodo, se coloca un implante pequeo y flexible dentro de cada trompa de Falopio. Los implantes hacen que se forme un tejido cicatricial en las trompas de Falopio y que las obstruya para que el espermatozoide no pueda llegar al vulo. El procedimiento demora alrededor de 3 meses para que sea efectivo. Debe utilizarse otro mtodo anticonceptivo durante esos 3 meses. Esterilizacin masculina Este es un procedimiento que consiste en atar los conductos que transportan el esperma (vasectoma). Luego del procedimiento, el hombre puede eyacular lquido (semen). Mtodos de planificacin natural Planificacin familiar natural En este mtodo, la pareja no tiene sexo durante los das en que la mujer podra quedar embarazada. Mtodo calendario Esto significa realizar un seguimiento de la duracin de cada ciclo menstrual, identificar los das en los que se puede producir un embarazo y no tener sexo durante esos das. Mtodo de la ovulacin En este mtodo, la pareja evita tener sexo durante la  ovulacin. Mtodo sintotrmico Este mtodo implica no tener sexo durante la ovulacin. Normalmente, la mujer comprueba la ovulacin al observar cambios en su temperatura y en la consistencia del moco cervical. Mtodo posovulacin En este mtodo, la pareja espera a que finalice la ovulacin para tener sexo. Resumen  La anticoncepcin, o los mtodos anticonceptivos, hace referencia a los mtodos o dispositivos que evitan el embarazo.  Los mtodos anticonceptivos hormonales incluyen implantes, inyecciones, pastillas, parches, anillos vaginales y anticonceptivos de emergencia.  Los mtodos anticonceptivos de barrera pueden incluir preservativos masculinos, preservativos femeninos, diafragmas, capuchones cervicales, esponjas y espermicidas.  Hay dos tipos de DIU (dispositivos intrauterinos). Un DIU puede colocarse en el tero de una mujer para evitar el embarazo durante 3 a 5 aos.  La esterilizacin permanente puede realizarse mediante un procedimiento para hombres, mujeres o ambos.  Los mtodos de planificacin familiar natural incluyen no tener sexo durante los das en que la mujer podra quedar embarazada. Esta informacin no tiene como fin reemplazar el consejo del mdico. Asegrese de hacerle al mdico cualquier pregunta que tenga. Document Released: 08/11/2005 Document Revised: 12/01/2016 Document Reviewed: 12/01/2016 Elsevier Interactive Patient Education  2018 Elsevier Inc.  

## 2017-09-09 NOTE — Progress Notes (Signed)
Interpreter Michele RiggsMariel Morales present for encounter. IOL scheduled on 1/28 @ 0730

## 2017-09-09 NOTE — H&P (Signed)
OBSTETRIC ADMISSION HISTORY AND PHYSICAL  Michele Morales is a 42 y.o. female [phone] with IUP at [redacted]w[redacted]d by LMP presenting for LOF/PROM.  LOF started around 1500; was clear with blood in it.  Reports regular contractions started this morning.  She reports +FMs, no blurry vision, headaches, peripheral edema, or RUQ pain.  She plans on breast and bottle feeding. She is undecided for birth control.  She received her prenatal care at [facility].   Dating: By LMP --->  Estimated Date of Delivery: 09/28/17  Sono:  @[redacted]w[redacted]d , CWD, normal anatomy, cephalic presentation, 3288g, 14% EFW  Prenatal History/Complications: - PNC at [facility] - T2DM - on insulin - Proteinuria in 2nd trimester - Grand Multipara - AMA  Past Medical History: Past Medical History:  Diagnosis Date  . Diabetes mellitus without complication (HCC)    IDDM  . Hypercholesteremia     Past Surgical History: Past Surgical History:  Procedure Laterality Date  . NO PAST SURGERIES      Obstetrical History: OB History    Gravida Para Term Preterm AB Living   7 6 6     6    SAB TAB Ectopic Multiple Live Births           6      Social History: Social History   Socioeconomic History  . Marital status: Single    Spouse name: None  . Number of children: None  . Years of education: None  . Highest education level: None  Social Needs  . Financial resource strain: None  . Food insecurity - worry: None  . Food insecurity - inability: None  . Transportation needs - medical: None  . Transportation needs - non-medical: None  Occupational History  . None  Tobacco Use  . Smoking status: Never Smoker  . Smokeless tobacco: Never Used  Substance and Sexual Activity  . Alcohol use: No  . Drug use: No  . Sexual activity: Yes  Other Topics Concern  . None  Social History Narrative  . None    Family History: Family History  Problem Relation Age of Onset  . Diabetes Mother     Allergies: No Known Allergies  Medications  Prior to Admission  Medication Sig Dispense Refill Last Dose  . aspirin EC 81 MG tablet Take 1 tablet (81 mg total) by mouth daily. 30 tablet 6 Taking  . insulin NPH Human (HUMULIN N,NOVOLIN N) 100 UNIT/ML injection Inject 26 units into the skin daily at breakfast and 16 units daily at bedtime 10 mL 3 Taking  . insulin regular (HUMULIN R) 100 units/mL injection Inject 18 units into the skin daily at breakfast and 10 units daily at supper 10 mL 11 Taking  . Insulin Syringes, Disposable, U-100 0.5 ML MISC 1 Syringe by Does not apply route 3 (three) times daily. 100 each 11 Taking  . Prenatal Vit-Fe Fumarate-FA (PRENATAL VITAMIN PO) Take by mouth.   Taking     Review of Systems   All systems reviewed and negative except as stated in HPI  Blood pressure (!) 138/54, pulse (!) 114, temperature 98 F (36.7 C), temperature source Oral, resp. rate 18, last menstrual period 12/22/2016, SpO2 100 %. General appearance: Alert, cooperative, in no acute distress Lungs: clear to auscultation bilaterally Heart: regular rate and rhythm Abdomen: soft, non-tender; bowel sounds normal Extremities: Homans sign is negative, no sign of DVT Presentation: cephalic Fetal monitoring: Baseline: 140 bpm, Variability: Good {> 6 bpm), Accelerations: Reactive and Decelerations: Absent Uterine activity: Contractions variable Dilation:  Fingertip Effacement (%): Thick Station: Ballotable Exam by:: Valentina Lucks. Woods, RN   Prenatal labs: ABO, Rh: --/--/O POS (01/16 1710) Antibody:  Negative Rubella: Immune (08/16 0000) RPR: Non Reactive (11/02 1017)  HBsAg: Negative (08/16 0000)  HIV: Non Reactive (11/02 1017)  GBS: Negative (01/09 1416)  Genetic screening: Quad 1:141 for Jeral [place], declined NIPS Anatomy US normal  Prenatal Transfer Tool  Maternal Diabetes: Yes:  Diabetes Type:  Insulin/Medication controlled Genetic Screening: Abnormal:  Results: Elevated risk of Trisomy 21 Maternal Ultrasounds/Referrals: Normal  Fetal  Ultrasounds or other Referrals:  Fetal echo, Referred to Materal Fetal Medicine  Maternal Substance Abuse:  No Significant Maternal Medications:  Insulin Significant Maternal Lab Results: A1c 8.3, elevated proteinuria  Results for orders placed or performed during the hospital encounter of 09/09/17 (from the past 24 hour(s))  POCT fern test   Collection Time: 09/09/17  4:56 PM  Result Value Ref Range   POCT Fern Test Positive = ruptured amniotic membanes     Patient Active Problem List   Diagnosis Date Noted  . Diabetes mellitus affecting pregnancy 09/09/2017  . Grand multipara 07/10/2017  . Proteinuria affecting pregnancy in second trimester 05/08/2017  . Advanced maternal age in multigravida, second trimester 05/07/2017  . Language barrier 05/07/2017  . Supervision of high risk pregnancy, antepartum 04/23/2017  . Diabetes mellitus affecting pregnancy, antepartum 04/23/2017    Assessment/Plan:  Michele Morales is a 42 y.o. G7P6006 at 1082w2d here for PROM. Early labor. T2DM.  #Labor: Expectant management as contractions are picking up. Augment as needed. Bedside US to confirm presentation. DM: CBG on admission 40 (patient had not eaten). Patient to eat to help with glycemic control. Trend CBG every 4 hours. #Pain: Analgesia per maternal request #FWB: Category I #ID:  GBS neg #MOF: Breast and bottle feeding #MOC: Undecided  Michele Morales, Medical Student  09/09/2017, 5:02 PM  OB FELLOW HISTORY AND PHYSICAL ATTESTATION  I confirm that I have verified the information documented in the medical student's note and that I have also personally reperformed the physical exam and all medical decision making activities. I agree with above documentation and have made edits as needed.   Caryl AdaJazma Phelps, DO OB Fellow 09/09/2017, 8:52 PM

## 2017-09-09 NOTE — ED Notes (Signed)
Video interpreter [phone] used for visit.

## 2017-09-09 NOTE — Progress Notes (Signed)
Patient ID: Michele FlightMaria Morales, female   DOB: Oct 01, 1975, 42 y.o.   MRN: [id number] . Vitals:   09/09/17 1800 09/09/17 1828 09/09/17 1858 09/09/17 2025  BP:    121/66  Pulse:    (!) 116  Resp: 16 18 20    Temp:      TempSrc:      SpO2:      Weight:      Height:         Doing well, not hurting much UCs every 5 min Dilation: Fingertip Effacement (%): Thick Cervical Position: Posterior Station: Ballotable Presentation: Undeterminable Exam by:: Valentina Lucks. Woods, RN  FHR reactive category I  Dr Despina HiddenEure recommends PO Cytotec for augmentation

## 2017-09-09 NOTE — Progress Notes (Signed)

## 2017-09-10 ENCOUNTER — Encounter (HOSPITAL_COMMUNITY): Payer: Self-pay

## 2017-09-10 DIAGNOSIS — Z3A37 37 weeks gestation of pregnancy: Secondary | ICD-10-CM

## 2017-09-10 DIAGNOSIS — O4202 Full-term premature rupture of membranes, onset of labor within 24 hours of rupture: Secondary | ICD-10-CM

## 2017-09-10 LAB — GLUCOSE, CAPILLARY
GLUCOSE-CAPILLARY: 62 mg/dL — AB (ref 65–99)
GLUCOSE-CAPILLARY: 108 mg/dL — AB (ref 65–99)
GLUCOSE-CAPILLARY: 102 mg/dL — AB (ref 65–99)
GLUCOSE-CAPILLARY: 141 mg/dL — AB (ref 65–99)
GLUCOSE-CAPILLARY: 152 mg/dL — AB (ref 65–99)
Glucose-Capillary: 138 mg/dL — ABNORMAL HIGH (ref 65–99)
Glucose-Capillary: 119 mg/dL — ABNORMAL HIGH (ref 65–99)
Glucose-Capillary: 198 mg/dL — ABNORMAL HIGH (ref 65–99)
Glucose-Capillary: 181 mg/dL — ABNORMAL HIGH (ref 65–99)

## 2017-09-10 LAB — RPR: RPR Ser Ql: NONREACTIVE

## 2017-09-10 MED ORDER — IBUPROFEN 600 MG PO TABS
600.0000 mg | ORAL_TABLET | Freq: Four times a day (QID) | ORAL | Status: DC
  Administered 2017-09-10 – 2017-09-12 (×9): 600 mg via ORAL
  Filled 2017-09-10 (×9): qty 1

## 2017-09-10 MED ORDER — TETANUS-DIPHTH-ACELL PERTUSSIS 5-2.5-18.5 LF-MCG/0.5 IM SUSP: 0.5000 mL | Freq: Once | INTRAMUSCULAR | Status: DC

## 2017-09-10 MED ORDER — ACETAMINOPHEN 325 MG PO TABS: 650.0000 mg | ORAL_TABLET | ORAL | Status: DC | PRN

## 2017-09-10 MED ORDER — DIBUCAINE 1 % RE OINT
1.0000 | TOPICAL_OINTMENT | RECTAL | Status: DC | PRN
Start: 2017-09-10 — End: 2017-09-12

## 2017-09-10 MED ORDER — ONDANSETRON HCL 4 MG PO TABS: 4.0000 mg | ORAL_TABLET | ORAL | Status: DC | PRN

## 2017-09-10 MED ORDER — SIMETHICONE 80 MG PO CHEW: 80.0000 mg | CHEWABLE_TABLET | ORAL | Status: DC | PRN

## 2017-09-10 MED ORDER — DIPHENHYDRAMINE HCL 25 MG PO CAPS: 25.0000 mg | ORAL_CAPSULE | Freq: Four times a day (QID) | ORAL | Status: DC | PRN

## 2017-09-10 MED ORDER — ZOLPIDEM TARTRATE 5 MG PO TABS: 5.0000 mg | ORAL_TABLET | Freq: Every evening | ORAL | Status: DC | PRN

## 2017-09-10 MED ORDER — ONDANSETRON HCL 4 MG/2ML IJ SOLN: 4.0000 mg | INTRAMUSCULAR | Status: DC | PRN

## 2017-09-10 MED ORDER — COCONUT OIL OIL: 1.0000 "application " | TOPICAL_OIL | Status: DC | PRN

## 2017-09-10 MED ORDER — METFORMIN HCL 500 MG PO TABS
1000.0000 mg | ORAL_TABLET | Freq: Two times a day (BID) | ORAL | Status: DC
  Administered 2017-09-11 – 2017-09-12 (×3): 1000 mg via ORAL
  Filled 2017-09-10 (×3): qty 2

## 2017-09-10 MED ORDER — WITCH HAZEL-GLYCERIN EX PADS: 1.0000 "application " | MEDICATED_PAD | CUTANEOUS | Status: DC | PRN

## 2017-09-10 MED ORDER — PRENATAL MULTIVITAMIN CH
1.0000 | ORAL_TABLET | Freq: Every day | ORAL | Status: DC
  Administered 2017-09-10 – 2017-09-11 (×2): 1 via ORAL
  Filled 2017-09-10 (×2): qty 1

## 2017-09-10 MED ORDER — SENNOSIDES-DOCUSATE SODIUM 8.6-50 MG PO TABS
2.0000 | ORAL_TABLET | ORAL | Status: DC
  Administered 2017-09-10 – 2017-09-12 (×2): 2 via ORAL
  Filled 2017-09-10 (×2): qty 2

## 2017-09-10 MED ORDER — BENZOCAINE-MENTHOL 20-0.5 % EX AERO
1.0000 | INHALATION_SPRAY | CUTANEOUS | Status: DC | PRN
Start: 2017-09-10 — End: 2017-09-12

## 2017-09-10 NOTE — Progress Notes (Signed)
[place] [place] interpreter Albin FellingCarla [phone] asked pt about her bleeding, pt denied clots but stated she felt small gushes with breastfeeding. Instructed to call if she feels large gushes or clots. Also confirmed via interpreter medication syringe on pt table belonged to the support person and was not for the pt herself.

## 2017-09-10 NOTE — Progress Notes (Signed)
Video Interpreter # [id number] used for AM rounding/ med pass.

## 2017-09-10 NOTE — Lactation Note (Signed)
This note was copied from a baby's chart. Lactation Consultation Note  Patient Name: Michele Morales VWUJW'JToday's Date: 09/10/2017 Reason for consult: Initial assessment;Infant < 6lbs;Early term 137-38.6wks  14 hours old 3237w 3d female who is being fully BF by her mother. Baby was unable to latch at the breast, did repetitive attempts but she was just too sleepy. Per mom changes at the breast have been minimal during this pregnancy. Mom has already been taught hand expression and she's been doing some spoon feeding. Reviewed with mom pump set up and cleaning, instruct her to pump every 3 hours, and keep putting baby at the breast based on feeding cues instead of on schedule. Mom will keep offering fresh expressed breastmilk to baby at every feeding.  Maternal Data Formula Feeding for Exclusion: No Has patient been taught Hand Expression?: Yes Does the patient have breastfeeding experience prior to this delivery?: Yes  Feeding Feeding Type: Breast Fed Length of feed: 0 min  LATCH Score Latch: Too sleepy or reluctant, no latch achieved, no sucking elicited.  Audible Swallowing: None  Type of Nipple: Everted at rest and after stimulation  Comfort (Breast/Nipple): Soft / non-tender  Hold (Positioning): Assistance needed to correctly position infant at breast and maintain latch.  LATCH Score: 5  Interventions Interventions: Breast feeding basics reviewed;Assisted with latch;Skin to skin;Breast massage;Hand express;Breast compression;Adjust position;Support pillows;Position options;Expressed milk;DEBP  Lactation Tools Discussed/Used Tools: Pump Breast pump type: Double-Electric Breast Pump WIC Program: No Pump Review: Setup, frequency, and cleaning Initiated by:: MPeck Date initiated:: 09/10/17   Consult Status Consult Status: Follow-up Date: 09/11/17 Follow-up type: In-patient    Jauan Wohl Venetia ConstableS Renlee Floor 09/10/2017, 3:10 PM

## 2017-09-10 NOTE — Progress Notes (Signed)
Patient ID: Michele Morales, female   DOB: 10/07/1975, 42 y.o.   MRN: [id number] Received call from nursing that patient BG was 190. With the spanish interpreter went to the patients room to confirm what medication she was on for DM prior to pregnancy and during pregnancy. Patient was taking 1000mg  BID prior to pregnancy and 26 u Hovolin in the Am and 16 U regular insulin in the PM with a scale at meals. Due to patients elevated BG will restart patient in metformin 1000mg  BID and monitor sugars.

## 2017-09-10 NOTE — Progress Notes (Signed)
Video Interpreter [phone] used for admission & paperwork

## 2017-09-11 LAB — GLUCOSE, CAPILLARY
GLUCOSE-CAPILLARY: 124 mg/dL — AB (ref 65–99)
Glucose-Capillary: 108 mg/dL — ABNORMAL HIGH (ref 65–99)
Glucose-Capillary: 132 mg/dL — ABNORMAL HIGH (ref 65–99)

## 2017-09-11 LAB — BIRTH TISSUE RECOVERY COLLECTION (PLACENTA DONATION)

## 2017-09-11 NOTE — Progress Notes (Signed)
POSTPARTUM PROGRESS NOTE  Post Partum Day 1  Subjective:  Michele Morales is a 42 y.o. [id number] s/p SVD at 7566w3d.  No acute events overnight.  Pt denies problems with ambulating, voiding or po intake.  She denies nausea or vomiting.  Pain is well controlled.  She has had flatus. She has had bowel movement.  Lochia Minimal.   Objective: Blood pressure (!) 106/58, pulse 91, temperature 97.8 F (36.6 C), temperature source Oral, resp. rate 18, height 5\' 1"  (1.549 m), weight 71.7 kg (158 lb), last menstrual period 12/22/2016, SpO2 100 %, unknown if currently breastfeeding.  Physical Exam:  General: alert, cooperative and no distress Chest: no respiratory distress Heart:regular rate, distal pulses intact Abdomen: soft, nontender,  Uterine Fundus: firm, appropriately tender DVT Evaluation: No calf swelling or tenderness Extremities: trace edema Skin: warm, dry  Recent Labs    09/09/17 1710  HGB 11.1*  HCT 32.8*    Assessment/Plan: Michele FlightMaria Dibella is a 42 y.o. [id number] s/p SVD at 37w3.d   PPD#1 - Doing well Contraception: undecided Feeding: breast Dispo: Plan for discharge today or tomorrow.   LOS: 2 days   Alroy BailiffParker W Cinthya Bors MD 09/11/2017, 7:18 AM

## 2017-09-11 NOTE — Lactation Note (Signed)
This note was copied from a baby's chart. Lactation Consultation Note RN called for latching assistance. RN stated unable to latch baby. Hand expressed colostrum and spoon fed 3ml colostrum. In football position for latching assistance. Baby cried and pushed away. Wouldn't suck. Baby finally sucked. Mom c/o pain, chin tug, mom felt much better. Baby BF well, didn't have a wide flange.  Before latching baby crying noted thick upper labial frenulum, very high palate, posterior tight frenulum possibly.  Mom is breast/formula feeding. Mom has DEBP w/no results. Hand expression encouraged after pumping.  LEAD information reviewed, LPI supplementing information given d/t 37 wks and less than 6lbs, also mom is diabetes.  Similac 22 cal given. Mom gave baby 10ml formula. Reviewed amount according to hours of age. Encouraged mom to burp and attempt to give a little more.  Mom is to BF, hand express, give colostrum, pump, give any BM first then supplement w/formula. FOB was main interpreter what mom didn't understand. FOB refused need [facility].  Patient Name: Michele Morales ZOXWR'UToday's Date: 09/11/2017 Reason for consult: (P) Follow-up assessment;Difficult latch;Early term 37-38.6wks;Infant < 6lbs   Maternal Data    Feeding Feeding Type: (P) Breast Fed Length of feed: 10 min  LATCH Score Latch: Repeated attempts needed to sustain latch, nipple held in mouth throughout feeding, stimulation needed to elicit sucking reflex.  Audible Swallowing: A few with stimulation  Type of Nipple: Everted at rest and after stimulation  Comfort (Breast/Nipple): Soft / non-tender  Hold (Positioning): Assistance needed to correctly position infant at breast and maintain latch.  LATCH Score: 7  Interventions Interventions: Breast feeding basics reviewed;Assisted with latch;Breast compression;Skin to skin;Breast massage;Support pillows;Hand express;Position options;DEBP;Expressed  milk  Lactation Tools Discussed/Used Tools: Pump Breast pump type: Double-Electric Breast Pump   Consult Status Consult Status: Follow-up Date: 09/11/17 Follow-up type: In-patient    Kaire Stary, Diamond NickelLAURA G 09/11/2017, 2:02 AM

## 2017-09-12 LAB — GLUCOSE, CAPILLARY
GLUCOSE-CAPILLARY: 144 mg/dL — AB (ref 65–99)
GLUCOSE-CAPILLARY: 186 mg/dL — AB (ref 65–99)

## 2017-09-12 MED ORDER — IBUPROFEN 600 MG PO TABS: 600.0000 mg | ORAL_TABLET | Freq: Four times a day (QID) | ORAL | 0 refills | Status: DC

## 2017-09-12 MED ORDER — METFORMIN HCL 1000 MG PO TABS: 1000.0000 mg | ORAL_TABLET | Freq: Two times a day (BID) | ORAL | 0 refills | Status: DC

## 2017-09-12 NOTE — Lactation Note (Signed)
This note was copied from a baby's chart. Lactation Consultation Note Went to check on baby feedings. Mom stated baby feeding well, BF first and give formula afterwards. LC wanted to discuss discharge, mom very sleepy. Nodding off. Baby crying, had BF and had formula. Baby burped and quiet down. Baby came off of DPT. Cont. To look jaundice, also has newborn rash.   Patient Name: Michele Launa FlightMaria Killingsworth WUJWJ'XToday's Date: 09/12/2017 Reason for consult: Follow-up assessment;Infant < 6lbs;Hyperbilirubinemia;Early term 37-38.6wks   Maternal Data    Feeding Feeding Type: Breast Fed Nipple Type: Slow - flow Length of feed: 30 min  LATCH Score Latch: Grasps breast easily, tongue down, lips flanged, rhythmical sucking.  Audible Swallowing: Spontaneous and intermittent  Type of Nipple: Everted at rest and after stimulation  Comfort (Breast/Nipple): Filling, red/small blisters or bruises, mild/mod discomfort  Hold (Positioning): No assistance needed to correctly position infant at breast.  LATCH Score: 9  Interventions    Lactation Tools Discussed/Used     Consult Status Consult Status: Follow-up Date: 09/12/17 Follow-up type: In-patient    Charyl DancerCARVER, Michele Morales 09/12/2017, 4:43 AM

## 2017-09-12 NOTE — Discharge Summary (Signed)
OB Discharge Summary     Patient Name: Michele Morales DOB: 1976-05-10 MRN: [id number]  Date of admission: 09/09/2017 Delivering MD: Aviva SignsWILLIAMS, MARIE L   Date of discharge: 09/12/2017  Admitting diagnosis: 37wks [place] Ctx not painful Intrauterine pregnancy: 8025w3d     Secondary diagnosis:  Principal Problem:   SVD (spontaneous vaginal delivery) Active Problems:   Grand multipara   Diabetes mellitus affecting pregnancy   PROM (premature rupture of membranes)     Discharge diagnosis: Term Pregnancy Delivered and Type 2 DM                                                                                                Post partum procedures:n/a  Augmentation: Cytotec x 1  Complications: None  Hospital course:  Onset of Labor With Vaginal Delivery     42 y.o. yo [id number] at 2525w3d was admitted with PROM on 09/09/2017. She had a single dose of oral cytotec and then progressed to an uncomplicated labor course as follows:  Membrane Rupture Time/Date: 3:00 PM ,09/09/2017   Intrapartum Procedures: Episiotomy: None [1]                                         Lacerations:  2nd degree [3]  Patient had a delivery of a Viable infant. 09/10/2017  Information for the patient's newborn:  Michele Morales, Girl Byrd HesselbachMaria [[id number]][[id number]]  Delivery Method: Vaginal, Spontaneous(Filed from Delivery Summary)   She had some elevated BPs during labor, but has been normotensive during her PP stay. She was given Metformin 1000 bid PP with FBS in the 140s and 2hr PPs up to 132. Pateint had an otherwise uncomplicated postpartum course.  She is ambulating, tolerating a carb mod diet, passing flatus, and urinating well. Patient is discharged home in stable condition on 09/12/17.   Physical exam  Vitals:   09/10/17 1843 09/11/17 0529 09/11/17 1724 09/12/17 0544  BP: 106/61 (!) 106/58 122/72 127/68  Pulse: (!) 111 91 (!) 103 93  Resp: 17 18 18 18   Temp: 97.6 F (36.4 C) 97.8 F (36.6 C) 97.9 F (36.6 C) 97.9 F  (36.6 C)  TempSrc: Oral Oral Oral Oral  SpO2: 100%     Weight:      Height:       General: alert, cooperative and no distress Lochia: appropriate Uterine Fundus: firm Incision: N/A DVT Evaluation: No evidence of DVT seen on physical exam. No significant calf/ankle edema. Labs: Lab Results  Component Value Date   WBC 12.6 (H) 09/09/2017   HGB 11.1 (L) 09/09/2017   HCT 32.8 (L) 09/09/2017   MCV 83.0 09/09/2017   PLT 283 09/09/2017   CMP Latest Ref Rng & Units 09/09/2017  Glucose 65 - 99 mg/dL 13(Y61(L)  BUN 6 - 20 mg/dL 15  Creatinine 8.650.44 - 7.841.00 mg/dL 6.960.46  Sodium 295135 - 284145 mmol/L 134(L)  Potassium 3.5 - 5.1 mmol/L 3.8  Chloride 101 - 111 mmol/L 109  CO2 22 - 32 mmol/L 18(L)  Calcium 8.9 -  10.3 mg/dL 1.6(X)  Total Protein 6.5 - 8.1 g/dL 6.1(L)  Total Bilirubin 0.3 - 1.2 mg/dL 0.4  Alkaline Phos 38 - 126 U/L 146(H)  AST 15 - 41 U/L 18  ALT 14 - 54 U/L 13(L)    Discharge instruction: per After Visit Summary and "Baby and Me Booklet".  After visit meds:  Allergies as of 09/12/2017   No Known Allergies     Medication List    STOP taking these medications   insulin NPH Human 100 UNIT/ML injection Commonly known as:  HUMULIN N,NOVOLIN N   insulin regular 100 units/mL injection Commonly known as:  HUMULIN R   PRENATAL VITAMIN PO     TAKE these medications   aspirin EC 81 MG tablet Take 1 tablet (81 mg total) by mouth daily.   ibuprofen 600 MG tablet Commonly known as:  ADVIL,MOTRIN Take 1 tablet (600 mg total) by mouth every 6 (six) hours.   metFORMIN 1000 MG tablet Commonly known as:  GLUCOPHAGE Take 1 tablet (1,000 mg total) by mouth 2 (two) times daily with a meal.       Diet: carb modified diet  Activity: Advance as tolerated. Pelvic rest for 6 weeks.   Outpatient follow up:4 weeks,  Follow up Appt: Future Appointments  Date Time Provider Department Center  10/21/2017  9:15 AM Judeth Horn, NP [facility]  10/26/2017  8:30 AM [facility]  [facility]   Follow up Visit:No Follow-up on file.  Postpartum contraception: Undecided  Newborn Data: Live born female  Birth Weight: 5 lb 12.8 oz (2631 g) APGAR: 8, 9  Newborn Delivery   Birth date/time:  09/10/2017 00:40:00 Delivery type:  Vaginal, Spontaneous     Baby Feeding: Bottle and Breast Disposition:home with mother   09/12/2017 Alroy Bailiff, MD  CNM attestation I have seen and examined this patient and agree with above documentation in the resident's note.   Michele Morales is a 42 y.o. [id number] s/p SVD.   Pain is well controlled.  Plan for birth control is undecided.  Method of Feeding: breast  PE:  BP 127/68 (BP Location: Right Arm)   Pulse 93   Temp 97.9 F (36.6 C) (Oral)   Resp 18   Ht 5\' 1"  (1.549 m)   Wt 71.7 kg (158 lb)   LMP 12/22/2016   SpO2 100%   Breastfeeding? Unknown   BMI 29.85 kg/m  Fundus firm  Recent Labs    09/09/17 1710  HGB 11.1*  HCT 32.8*     Plan: discharge today - postpartum care discussed - f/u clinic in 4 weeks for postpartum visit   Cam Hai, CNM 8:56 AM 09/12/2017

## 2017-09-15 ENCOUNTER — Other Ambulatory Visit: Payer: Self-pay | Admitting: *Deleted

## 2017-09-16 ENCOUNTER — Encounter: Payer: Self-pay | Admitting: Obstetrics and Gynecology

## 2017-09-16 ENCOUNTER — Other Ambulatory Visit: Payer: Self-pay

## 2017-09-21 ENCOUNTER — Inpatient Hospital Stay (HOSPITAL_COMMUNITY): Payer: Self-pay

## 2017-09-21 ENCOUNTER — Encounter (HOSPITAL_COMMUNITY): Payer: Self-pay

## 2017-09-23 ENCOUNTER — Encounter: Payer: Self-pay | Admitting: Obstetrics & Gynecology

## 2017-09-23 ENCOUNTER — Other Ambulatory Visit: Payer: Self-pay

## 2017-10-19 ENCOUNTER — Ambulatory Visit: Payer: Self-pay | Admitting: Advanced Practice Midwife

## 2017-10-21 ENCOUNTER — Encounter: Payer: Self-pay | Admitting: Student

## 2017-10-21 ENCOUNTER — Ambulatory Visit (INDEPENDENT_AMBULATORY_CARE_PROVIDER_SITE_OTHER): Payer: Self-pay | Admitting: Student

## 2017-10-21 DIAGNOSIS — Z789 Other specified health status: Secondary | ICD-10-CM

## 2017-10-21 NOTE — Progress Notes (Signed)
Subjective:     Michele Morales is a 42 y.o. female who presents for a postpartum visit. She is 5 weeks postpartum following a spontaneous vaginal delivery. I have fully reviewed the prenatal and intrapartum course. The delivery was at 7959w2d gestational weeks. Outcome: spontaneous vaginal delivery. Anesthesia: none. Postpartum course has been normal. Baby's course has been normal. Baby is feeding by both breast and bottle - gerber gentle. Bleeding no bleeding. Bowel function is normal. Bladder function is normal. Patient is not sexually active. Contraception method is pt not sure. Postpartum depression screening: negative.  The following portions of the patient's history were reviewed and updated as appropriate: allergies, current medications, past family history, past medical history, past social history, past surgical history and problem list.  Review of Systems Pertinent items are noted in HPI.   Objective:    BP 108/75   Pulse 100   LMP 10/12/2017 (Approximate)   General:  alert, cooperative and appears stated age  Lungs: clear to auscultation bilaterally  Heart:  regular rate and rhythm, S1, S2 normal, no murmur, click, rub or gallop  Abdomen: soft, non-tender; bowel sounds normal; no masses,  no organomegaly        Assessment:     Normal postpartum exam. Pap smear not done at today's visit. Had normal pap smear 03/2017  Patient with type 2 diabetes, on metformin. States her PCP is a clinic on [address] & that they normally manage her diabetes.   Discussed contraception with patient. Wants to use condoms but open to other options in the future, will provide with list of contraception choices. Plan:   1. Encounter for routine postpartum follow-up -Pap up to date -Contraception -- condoms for now. Given list of contraception choices -Pt to f/u with her PCP to manage her T2DM --- states her fasting blood sugars have been 120s-130s --- currently on metformin  2. Language  barrier -video Spanish interpreter used for visit   Michele Morales, Michele Petras, NP

## 2017-10-21 NOTE — Patient Instructions (Signed)
Eleccin del mtodo anticonceptivo Contraception Choices La anticoncepcin, o los mtodos anticonceptivos, hace referencia a los mtodos o dispositivos que evitan el [place]. Mtodos hormonales Implante anticonceptivo Un implante anticonceptivo consiste en un tubo plstico delgado que contiene una hormona. Se inserta en la parte superior del brazo. Puede Consulting civil engineer por 3 aos. Inyecciones de progestina sola Las inyecciones de progestina sola contienen progestina, una forma sinttica de la hormona progesterona. Un mdico las administra cada 3 meses. Pldoras anticonceptivas Las pldoras anticonceptivas son pastillas que contienen hormonas que evitan el [place]. Deben tomarse una vez al da, preferentemente a la misma Economist. Parches anticonceptivos El parche anticonceptivo contiene hormonas que evitan el [place]. Se coloca en la piel, debe cambiarse una vez a la semana durante tres semanas y debe retirarse en la cuarta semana. Se necesita una receta para utilizar este mtodo anticonceptivo. Anillo vaginal Un anillo vaginal contiene hormonas que evitan el embarazo. Se coloca en la vagina durante tres semanas y se retira en la cuarta semana. Luego se repite el proceso con un anillo nuevo. Se necesita una receta para utilizar este mtodo anticonceptivo. Anticonceptivo de Associate Professor Los anticonceptivos de emergencia evitan el embarazo despus de Warehouse manager sexo sin proteccin. Vienen en forma de pldora y pueden tomarse hasta 5 das despus de [place]. Funcionan mejor cuando se toman lo ms pronto posible luego de [employer]. La mayora de los anticonceptivos de emergencia estn disponibles sin receta mdica. Este mtodo no debe utilizarse como el nico mtodo anticonceptivo. Mtodos de barrera Preservativo masculino Un preservativo masculino es una vaina delgada que se coloca sobre el pene durante el sexo. Los preservativos evitan que el esperma ingrese en el cuerpo de la  [place]. Pueden utilizarse con un espermicida para aumentar la efectividad. Deben desecharse luego de su uso. Preservativo femenino Un preservativo femenino es una vaina blanda y holgada que se coloca en la vagina antes de [place]. El preservativo evita que el esperma ingrese en el cuerpo de la [place]. Deben desecharse luego de su uso. Diafragma Un diafragma es una barrera blanda con forma de cpula. Se inserta en la vagina antes del sexo, junto con un espermicida. El diafragma bloquea el ingreso de esperma en el tero, y el espermicida mata a los espermatozoides. El Designer, fashion/clothing en la vagina durante 6 a 8 horas despus de Warehouse manager sexo y debe retirarse en el plazo de las 24 horas. Un diafragma es recetado y colocado por un mdico. Debe reemplazarse cada 1 a 2 aos, despus de dar a luz, de aumentar ms de 15lb (6.8kg) y de [place] plvica. Capuchn cervical Un capuchn cervical es una copa redonda y blanda de ltex o plstico que se coloca en el cuello uterino. Se inserta en la vagina antes del sexo, junto con un espermicida. Bloquea el ingreso del esperma en el tero. El capuchn Radio producer durante 6 a 8 horas despus de Warehouse manager sexo y debe retirarse en el plazo de las 48 horas. Un capuchn cervical debe ser recetado y colocado por un mdico. Debe reemplazarse cada 2aos. Esponja Una esponja es una pieza blanda y circular de espuma de poliuretano que contiene espermicida. La esponja ayuda a bloquear el ingreso de esperma en el tero, y el espermicida mata a los espermatozoides. Belva Bertin, debe humedecerla e insertarla en la vagina. Debe insertarse antes de [employer], debe permanecer dentro al menos durante 6 horas despus de [place] sexo y debe retirarse y Nurse, adult en  el plazo de las 30 horas. Espermicidas Los espermicidas son sustancias qumicas que matan o bloquean al esperma y no lo dejan ingresar al cuello uterino y al tero. Vienen en forma de crema, gel,  supositorio, espuma o comprimido. Un espermicida debe insertarse en la vagina con un aplicador al menos 10 o 15 minutos antes de tener sexo para dar tiempo a que surta efecto. El proceso debe repetirse cada vez que tenga sexo. Los espermicidas no requieren receta mdica. Anticonceptivos intrauterinos Dispositivo intrauterino (DIU). Un DIU un dispositivo en forma de T que se coloca en el tero. Hay dos tipos:  DIU hormonal. Este tipo contiene progestina, una forma sinttica de la hormona progesterona. Este tipo puede permanecer colocado durante 3 a 5 aos.  DIU de cobre. Este tipo est recubierto con un alambre de cobre. Puede permanecer colocado durante 10 aos.  Mtodos anticonceptivos permanentes Ligadura de trompas en la mujer En este mtodo, se sellan, atan u obstruyen las trompas de Falopio durante una ciruga para evitar que el vulo descienda hacia el tero. Esterilizacin histeroscpica En este mtodo, se coloca un implante pequeo y flexible dentro de cada trompa de Falopio. Los implantes hacen que se forme un tejido cicatricial en las trompas de Falopio y que las obstruya para que el espermatozoide no pueda llegar al vulo. El procedimiento demora alrededor de 3 meses para que sea efectivo. Debe utilizarse otro mtodo anticonceptivo durante esos 3 meses. Esterilizacin masculina Este es un procedimiento que consiste en atar los conductos que transportan el esperma (vasectoma). Luego del procedimiento, el hombre puede eyacular lquido (semen). Mtodos de planificacin natural Planificacin familiar natural En este mtodo, la pareja no tiene sexo durante los das en que la mujer podra quedar embarazada. Mtodo calendario Esto significa realizar un seguimiento de la duracin de cada ciclo menstrual, identificar los das en los que se puede producir un embarazo y no tener sexo durante esos das. Mtodo de la ovulacin En este mtodo, la pareja evita tener sexo durante la  ovulacin. Mtodo sintotrmico Este mtodo implica no tener sexo durante la ovulacin. Normalmente, la mujer comprueba la ovulacin al observar cambios en su temperatura y en la consistencia del moco cervical. Mtodo posovulacin En este mtodo, la pareja espera a que finalice la ovulacin para tener sexo. Resumen  La anticoncepcin, o los mtodos anticonceptivos, hace referencia a los mtodos o dispositivos que evitan el embarazo.  Los mtodos anticonceptivos hormonales incluyen implantes, inyecciones, pastillas, parches, anillos vaginales y anticonceptivos de emergencia.  Los mtodos anticonceptivos de barrera pueden incluir preservativos masculinos, preservativos femeninos, diafragmas, capuchones cervicales, esponjas y espermicidas.  Hay dos tipos de DIU (dispositivos intrauterinos). Un DIU puede colocarse en el tero de una mujer para evitar el embarazo durante 3 a 5 aos.  La esterilizacin permanente puede realizarse mediante un procedimiento para hombres, mujeres o ambos.  Los mtodos de planificacin familiar natural incluyen no tener sexo durante los das en que la mujer podra quedar embarazada. Esta informacin no tiene como fin reemplazar el consejo del mdico. Asegrese de hacerle al mdico cualquier pregunta que tenga. Document Released: 08/11/2005 Document Revised: 12/01/2016 Document Reviewed: 12/01/2016 Elsevier Interactive Patient Education  2018 Elsevier Inc.  

## 2017-10-26 ENCOUNTER — Other Ambulatory Visit: Payer: Self-pay

## 2017-10-26 ENCOUNTER — Other Ambulatory Visit: Payer: Self-pay | Admitting: General Practice

## 2017-10-26 DIAGNOSIS — O24439 Gestational diabetes mellitus in the puerperium, unspecified control: Secondary | ICD-10-CM

## 2018-07-05 IMAGING — US US MFM OB FOLLOW-UP
1 series · 13 of 28 positions shown · non-contrast
Comparison: none

1  GABO BAN           [id number]      [id number]     [id number]
Indications

29 weeks gestation of pregnancy
Abnormal biochemical screen (quad) for
Trisomy 21 ([DATE])
Poor obstetric history: Prior fetal
macrosomia, antepartum (4100g in 2661)
Advanced maternal age multigravida 35+,
third trimester (declined NIPS)
Pre-existing diabetes, type 2, in pregnancy,
third trimester (insulin)
Antenatal follow-up for nonvisualized fetal
anatomy
OB History
Blood Type:            Height:  4'11"  Weight (lb):  142       BMI:
Gravidity:    7         Term:   6
Living:       6
Fetal Evaluation
Num Of Fetuses:     1
Fetal Heart         140
Rate(bpm):
Cardiac Activity:   Observed
Presentation:       Cephalic
Placenta:           Anterior, above cervical os
P. Cord Insertion:  Visualized, central
Amniotic Fluid
AFI FV:      Subjectively within normal limits
AFI Sum(cm)     %Tile       Largest Pocket(cm)
20.25           80
RUQ(cm)       RLQ(cm)       LUQ(cm)        LLQ(cm)
6.03
Biometry
BPD:      73.3  mm     G. Age:  29w 3d         46  %    CI:        76.61   %    70 - 86
FL/HC:      20.1   %    19.6 -
HC:      265.3  mm     G. Age:  29w 0d         13  %    HC/AC:      1.03        0.99 -
AC:       257   mm     G. Age:  29w 6d         65  %    FL/BPD:     72.7   %    71 - 87
FL:       53.3  mm     G. Age:  28w 2d         16  %    FL/AC:      20.7   %    20 - 24
Est. FW:    7389  gm           3 lb     53  %
Gestational Age
LMP:           29w 1d        Date:  12/22/16                 EDD:   09/28/17
U/S Today:     29w 1d                                        EDD:   09/28/17
Best:          29w 1d     Det. By:  LMP  (12/22/16)          EDD:   09/28/17
Anatomy
Cranium:               Appears normal         Aortic Arch:            Appears normal
Cavum:                 Previously seen        Ductal Arch:            Appears normal
Ventricles:            Appears normal         Diaphragm:              Appears normal
Choroid Plexus:        Previously seen        Stomach:                Appears normal, left
sided
Cerebellum:            Previously seen        Abdomen:                Previously seen
Posterior Fossa:       Previously seen        Abdominal Wall:         Previously seen
Nuchal Fold:           Not applicable (>20    Cord Vessels:           Previously seen
wks GA)
Face:                  Orbits and profile     Kidneys:                Appear normal
previously seen
Lips:                  Appears normal         Bladder:                Appears normal
Thoracic:              Appears normal         Spine:                  Limited views
appear normal
Heart:                 Appears normal         Upper Extremities:      Previously seen
(4CH, axis, and situs
RVOT:                  Appears normal         Lower Extremities:      Previously seen
LVOT:                  Appears normal
Other:  Fetus appears to be a female. Heels visualized previously.
Technically difficult due to fetal position.
Cervix Uterus Adnexa
Cervix
Not visualized (advanced GA >83wks)
Impression
INDICATION: 41 yr old [id number] at 63w2d with type II diabetes
for fetal growth and reevaluate fetal anatomy.

[Series 1: us mfm ob follow-up · 13 of 68 slices shown]
[im 3/68]
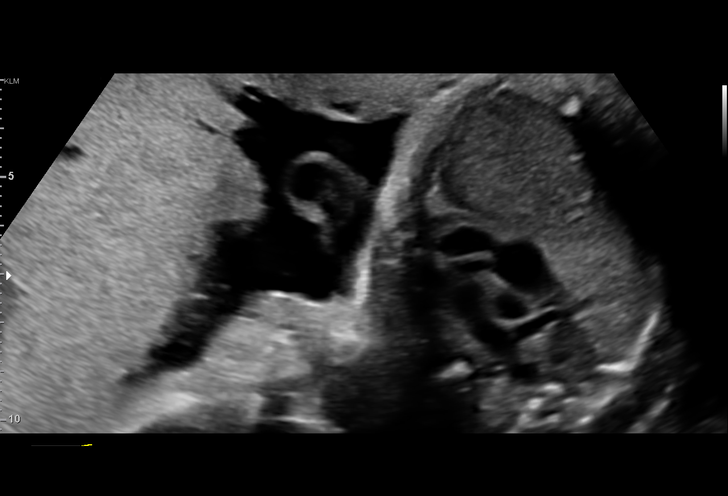
[im 8/68]
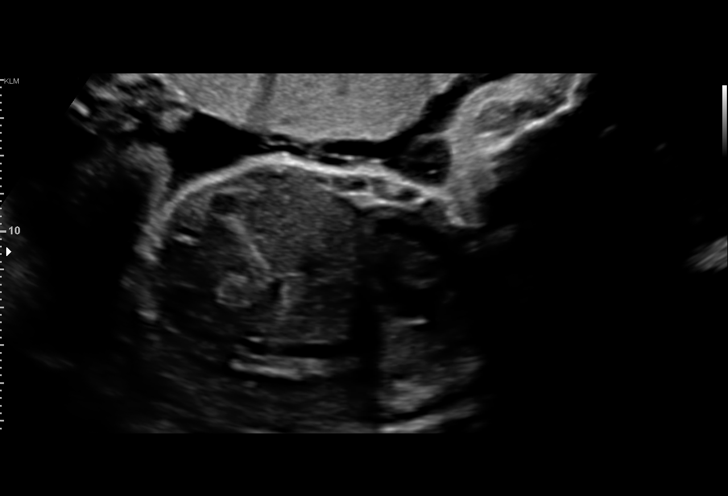
[im 13/68]
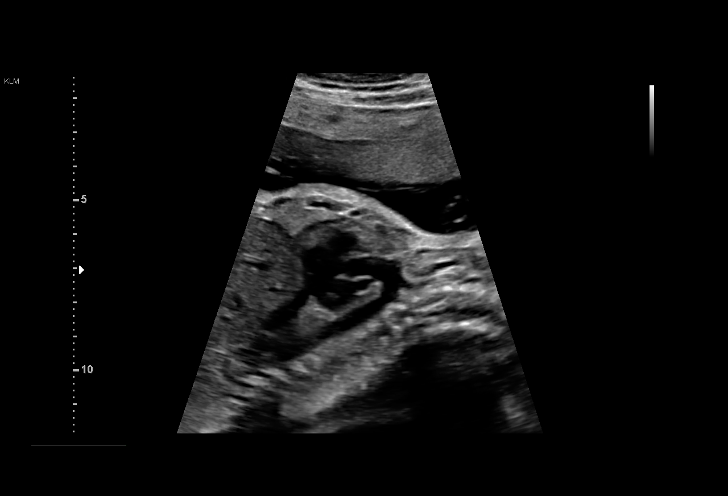
[im 18/68]
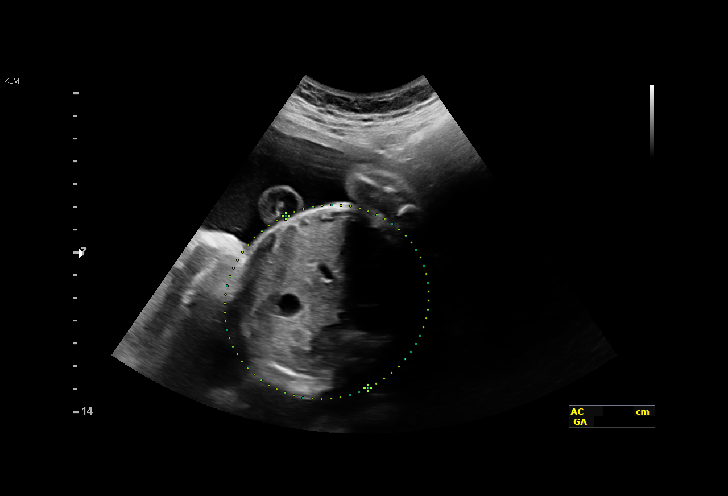
[im 23/68]
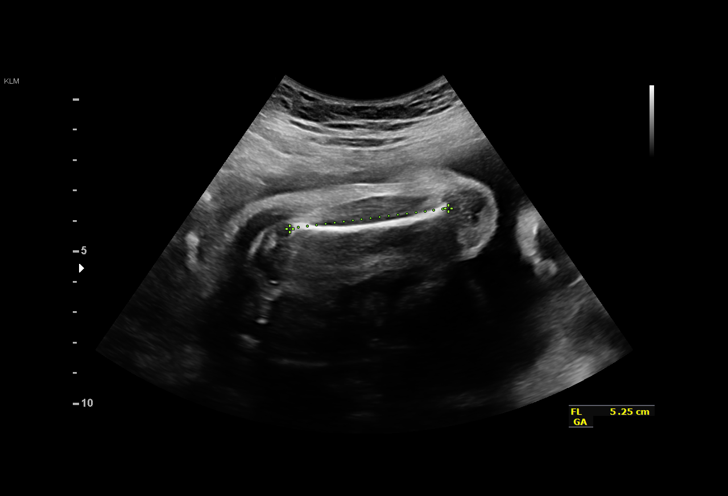
[im 28/68]
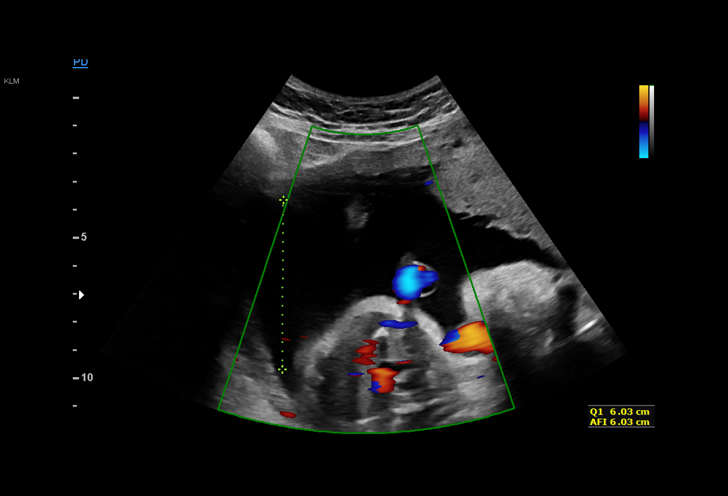
[im 35/68]
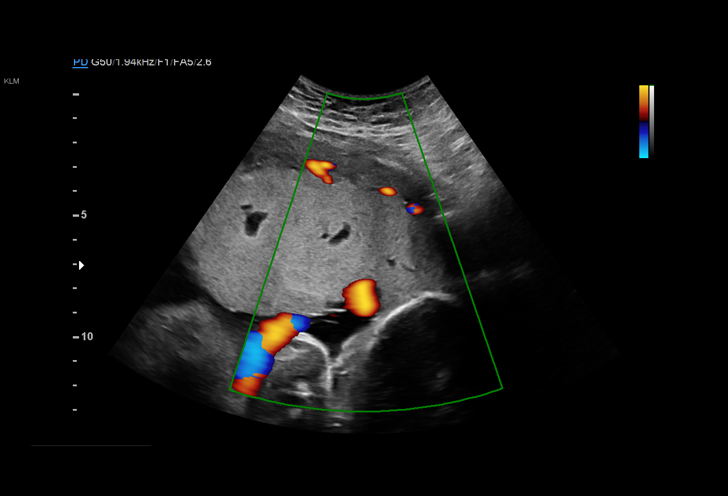
[im 40/68]
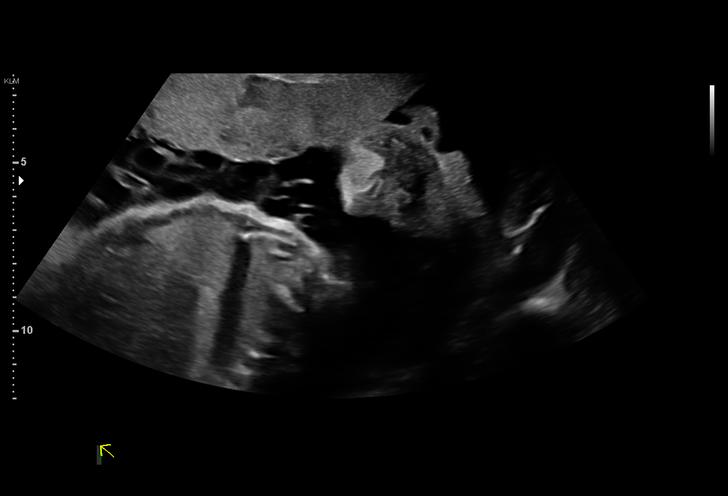
[im 45/68]
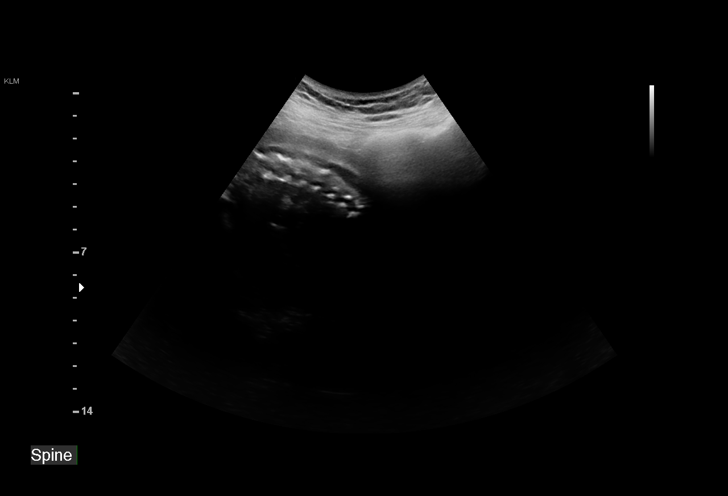
[im 50/68]
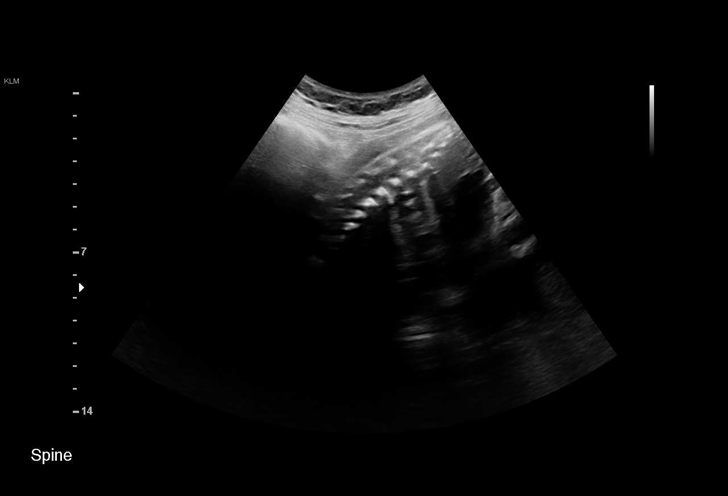
[im 55/68]
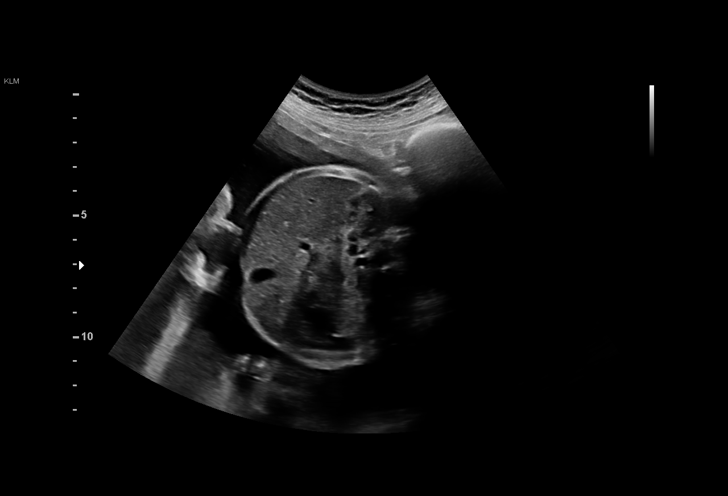
[im 60/68]
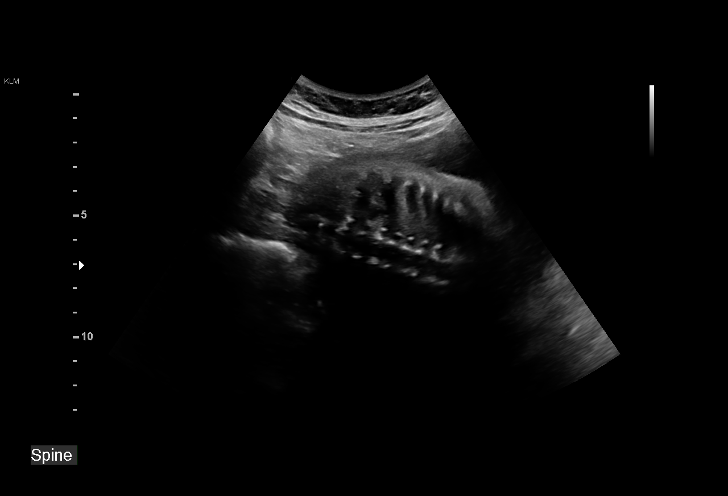
[im 65/68]
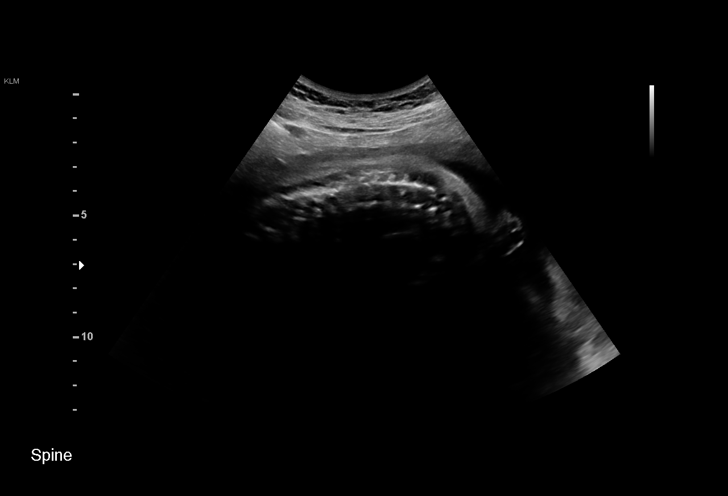

[13 of 28 positions shown; findings below may reference images not displayed]

FINDINGS: 1. Single intrauterine pregnancy.
2. Estimated fetal weight is in the 53rd%.
3. Anterior placenta without evidence of previa.
4. Normal amniotic fluid index.
5. The views of the spine remain limited.
6. The remainder of the limited anatomy survey is normal.
Recommendations

1. Appropriate fetal growth.
2. Limited anatomy survey:
- views of spine are limited- reevaluate on follow up
ultrasound
- limited spine views are normal
3. Diabetes:
- on insulin
- normal fetal echocardiogram
- recommend fetal growth every 4 weeks
- recommend start antenatal testing at 32 weeks
- recommend delivery at 39 weeks or sooner if clinically
indicated
4. Advanced maternal age:
- had elevated risk of trisomy 21 on quad screen
- previously counseled
- declined further testing
- recommend fetal surveillance as above

## 2018-08-02 IMAGING — US US MFM FETAL BPP W/O NON-STRESS
1 series · 13 of 28 positions shown · non-contrast
Comparison: none

[Series 1: us mfm fetal bpp w/o non-stress · 58 acquisitions, 13 frames shown]
[im 3/58]
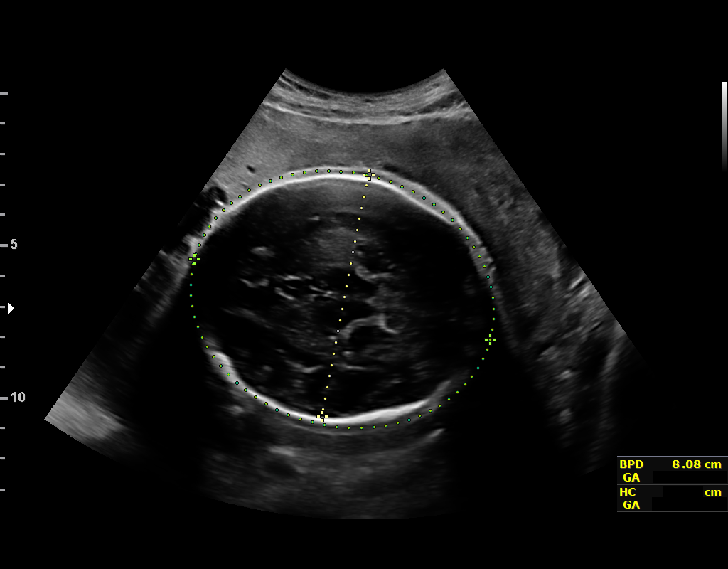
[im 7/58]
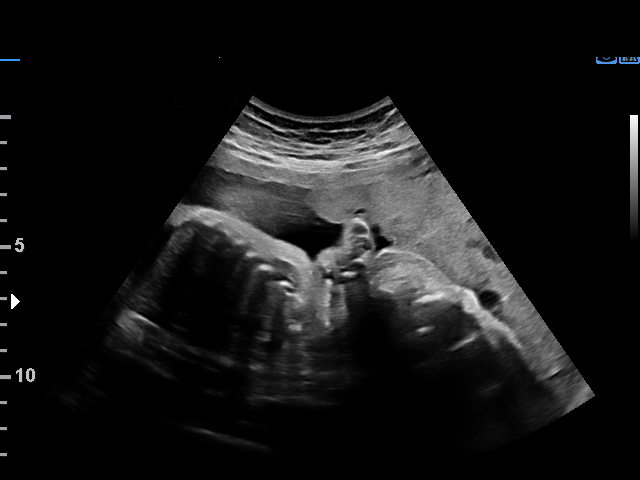
[im 11/58]
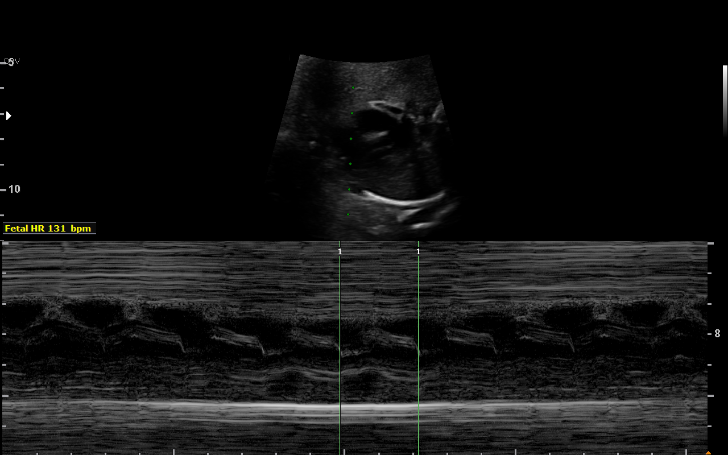
[im 15/58]
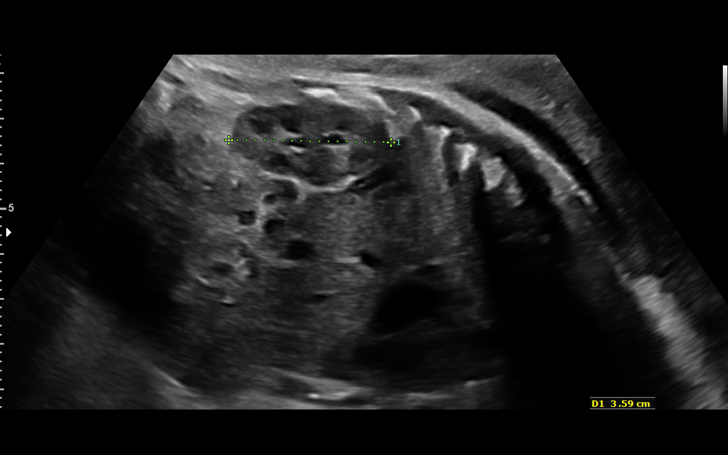
[im 20/58]
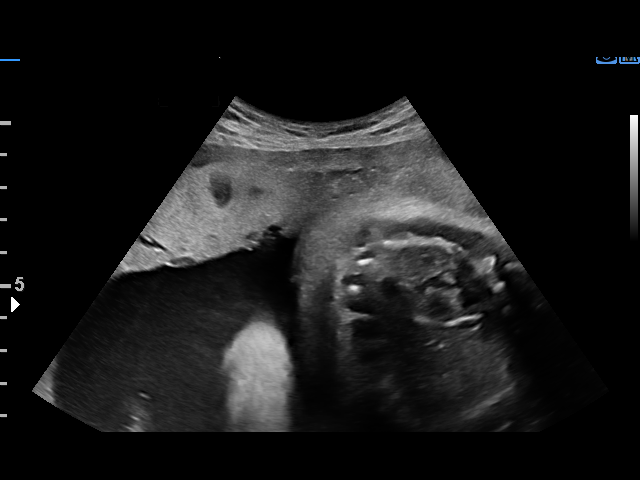
[im 24/58]
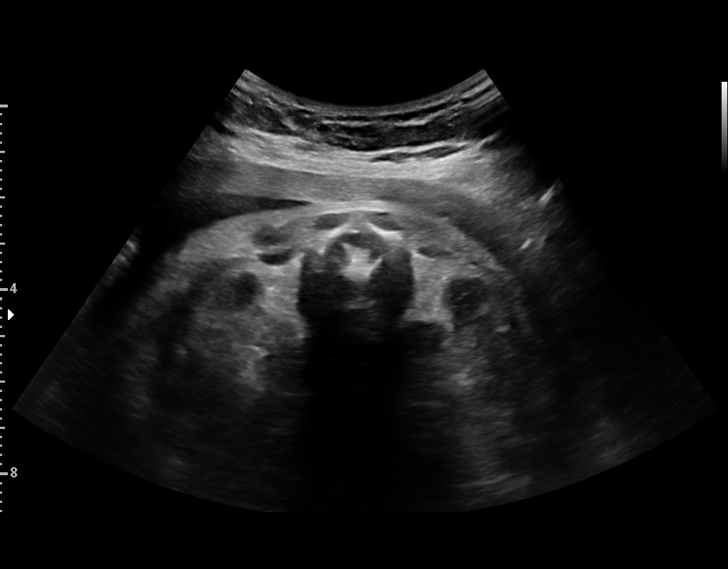
[im 30/58]
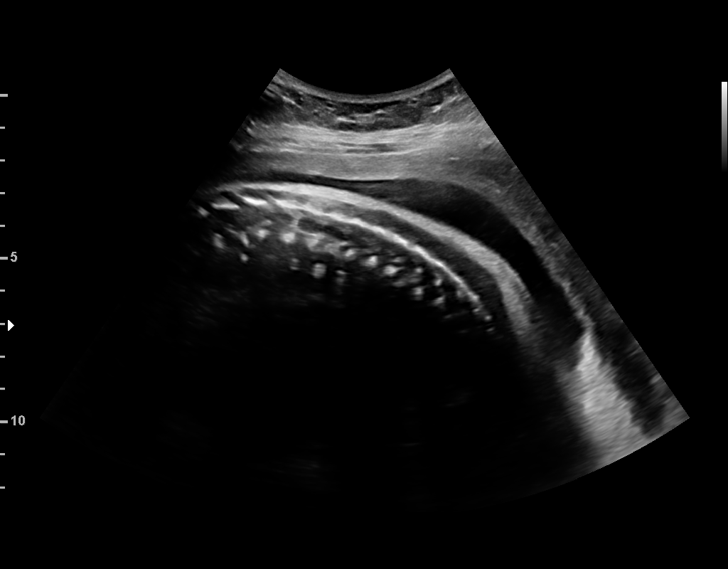
[im 34/58]
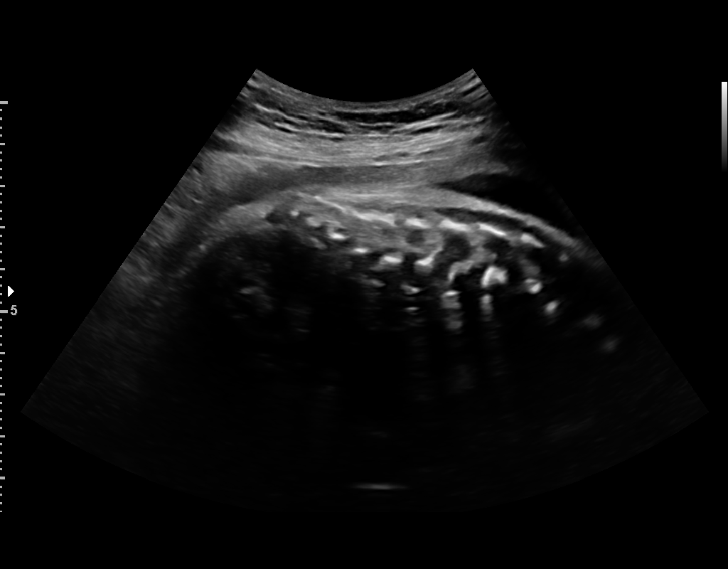
[im 39/58]
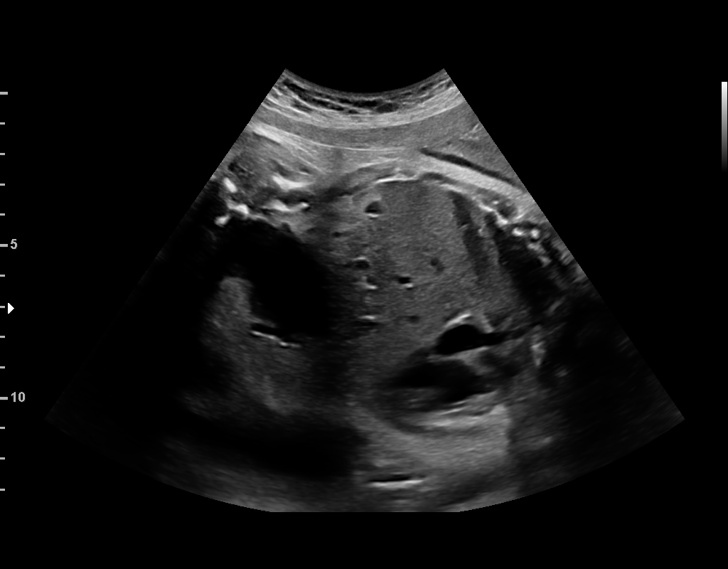
[im 43/58]
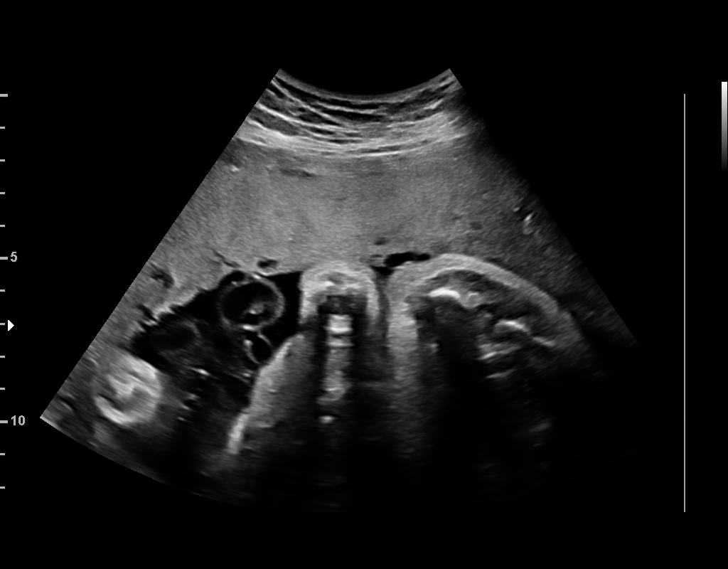
[im 47/58]
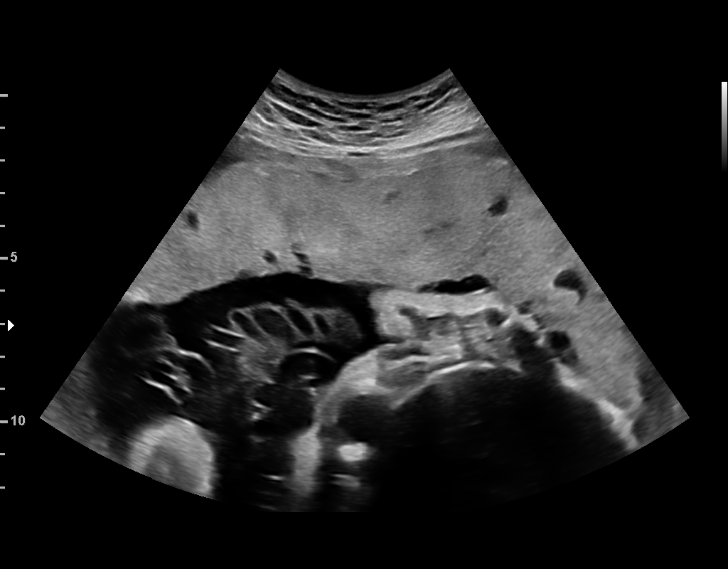
[im 51/58]
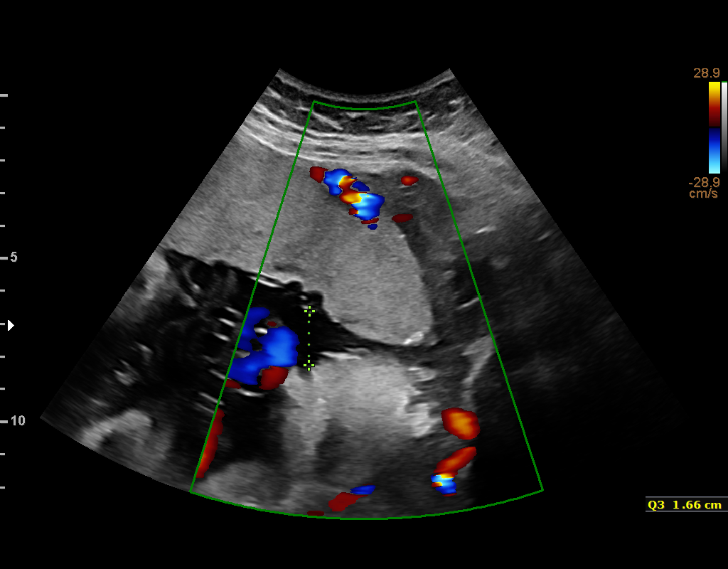
[im 55/58]
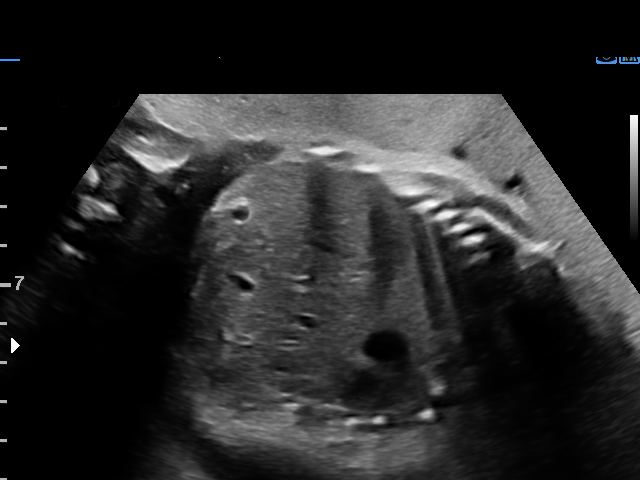

[13 of 28 positions shown; findings below may reference images not displayed]

1  SORIN OXENDINE            [id number]      [id number]     [id number]
2  NAZARETH JUMPER          [id number]      [id number]     [id number]
Indications

33 weeks gestation of pregnancy
Abnormal biochemical screen (quad) for
Trisomy 21 ([DATE])
Poor obstetric history: Prior fetal
macrosomia, antepartum (4100g in 2772)
Advanced maternal age multigravida 35+
(41), third trimester (declined NIPS)
Pre-existing diabetes, type 2, in pregnancy,
third trimester (insulin)
Antenatal follow-up for nonvisualized fetal
anatomy
OB History

Blood Type:            Height:  4'11"  Weight (lb):  142       BMI:
Gravidity:    7         Term:   6
Living:       6
Fetal Evaluation

Num Of Fetuses:     1
Fetal Heart         131
Rate(bpm):
Cardiac Activity:   Observed
Presentation:       Cephalic
Placenta:           Anterior, above cervical os
P. Cord Insertion:  Previously Visualized
Amniotic Fluid
AFI FV:      Subjectively within normal limits

AFI Sum(cm)     %Tile       Largest Pocket(cm)
13.25           42

RUQ(cm)       RLQ(cm)       LUQ(cm)        LLQ(cm)
5.96
Biophysical Evaluation

Amniotic F.V:   Within normal limits       F. Tone:        Observed
F. Movement:    Observed                   Score:          [DATE]
F. Breathing:   Observed
Biometry

BPD:      80.5  mm     G. Age:  32w 2d         21  %    CI:        76.04   %    70 - 86
FL/HC:      21.0   %    19.9 -
HC:      292.6  mm     G. Age:  32w 2d          5  %    HC/AC:      0.96        0.96 -
AC:      304.5  mm     G. Age:  34w 3d         83  %    FL/BPD:     76.3   %    71 - 87
FL:       61.4  mm     G. Age:  31w 6d         12  %    FL/AC:      20.2   %    20 - 24
HUM:      54.1  mm     G. Age:  31w 3d         25  %

Est. FW:    8807  gm    4 lb 12 oz      61  %
Gestational Age

LMP:           33w 1d        Date:  12/22/16                 EDD:   09/28/17
U/S Today:     32w 5d                                        EDD:   10/01/17
Best:          33w 1d     Det. By:  LMP  (12/22/16)          EDD:   09/28/17
Anatomy

Cranium:               Appears normal         Aortic Arch:            Previously seen
Cavum:                 Appears normal         Ductal Arch:            Previously seen
Ventricles:            Appears normal         Diaphragm:              Previously seen
Choroid Plexus:        Previously seen        Stomach:                Appears normal, left
sided
Cerebellum:            Previously seen        Abdomen:                Previously seen
Posterior Fossa:       Previously seen        Abdominal Wall:         Previously seen
Nuchal Fold:           Not applicable (>20    Cord Vessels:           Previously seen
wks GA)
Face:                  Orbits and profile     Kidneys:                Appear normal
previously seen
Lips:                  Previously seen        Bladder:                Appears normal
Thoracic:              Previously seen        Spine:                  Appears normal
Heart:                 Previously seen        Upper Extremities:      Previously seen
RVOT:                  Previously seen        Lower Extremities:      Previously seen
LVOT:                  Previously seen

Other:  Fetus appears to be a female prev seen. Heels visualized previously.
Technically difficult due to fetal position.
Cervix Uterus Adnexa

Cervix
Not visualized (advanced GA >83wks)
Impression

SIUP at 33+1 weeks
Normal interval anatomy; anatomic survey complete
Normal amniotic fluid volume
Appropriate interval growth with EFW at the 61st %tile
BPP [DATE]
Recommendations

Continue antenatal testing
Follow-up ultrasound for growth in 4 weeks

## 2018-08-17 IMAGING — US US FETAL BPP W/ NON-STRESS
1 series · 13 of 13 positions shown · non-contrast
Comparison: none

[Series 1: us fetal bpp w/nonstress · 13 acquisitions, 13 frames shown]
[im 1/13]
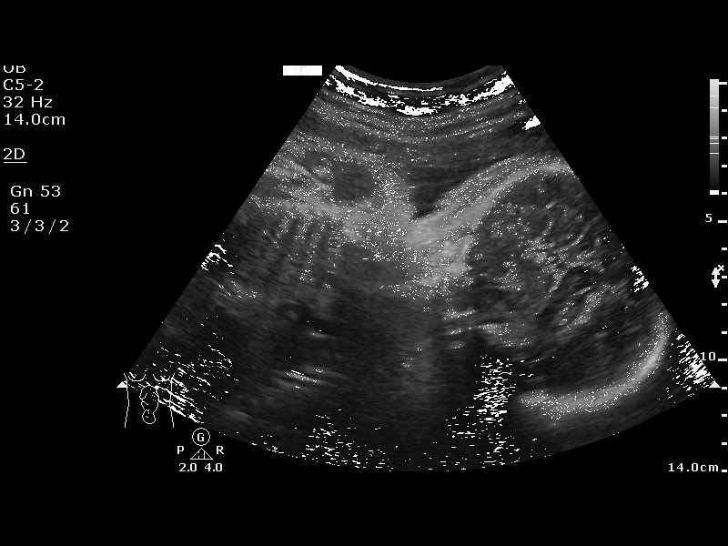
[im 2/13]
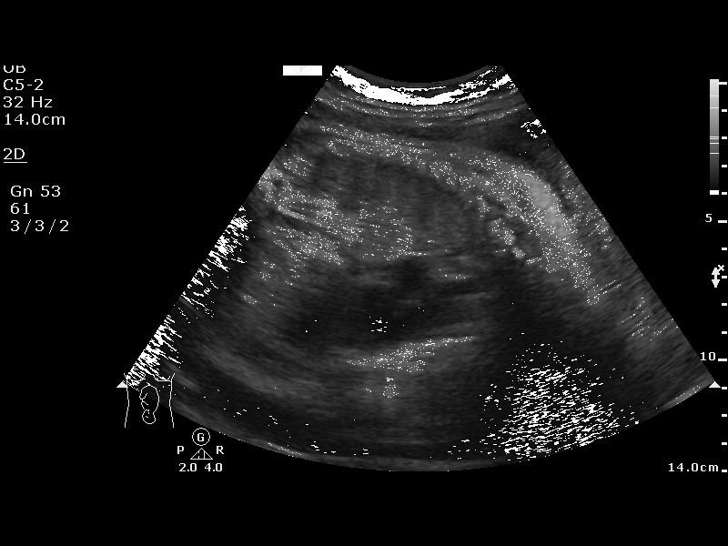
[im 3/13]
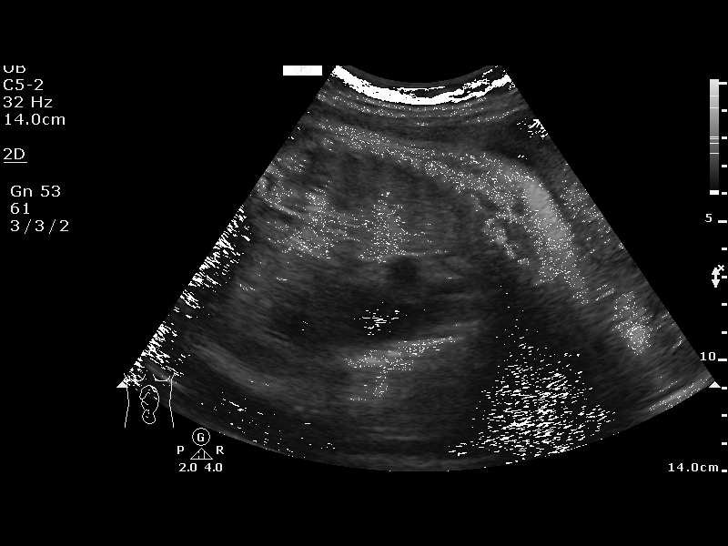
[im 4/13]
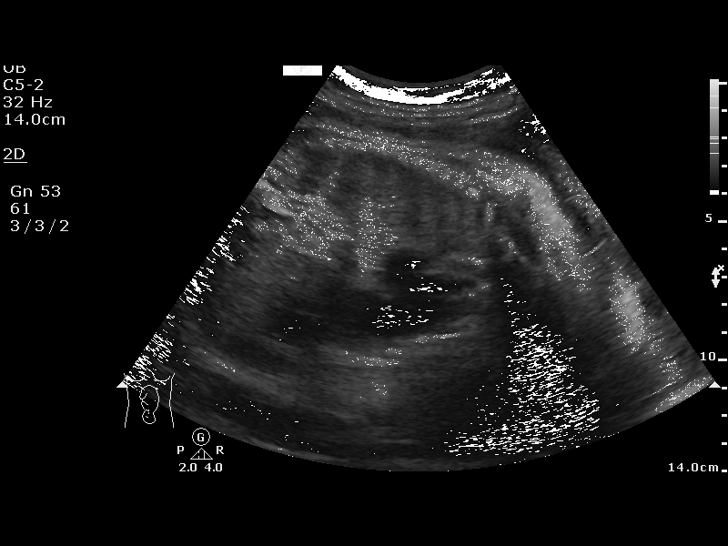
[im 5/13]
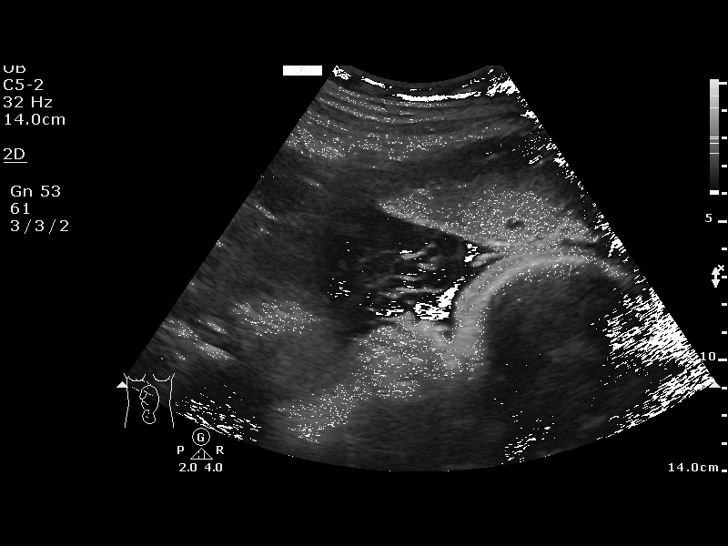
[im 6/13]
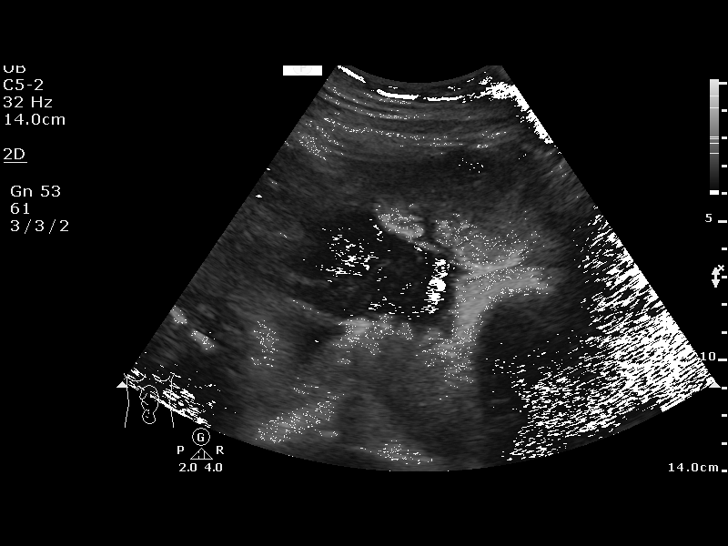
[im 7/13]
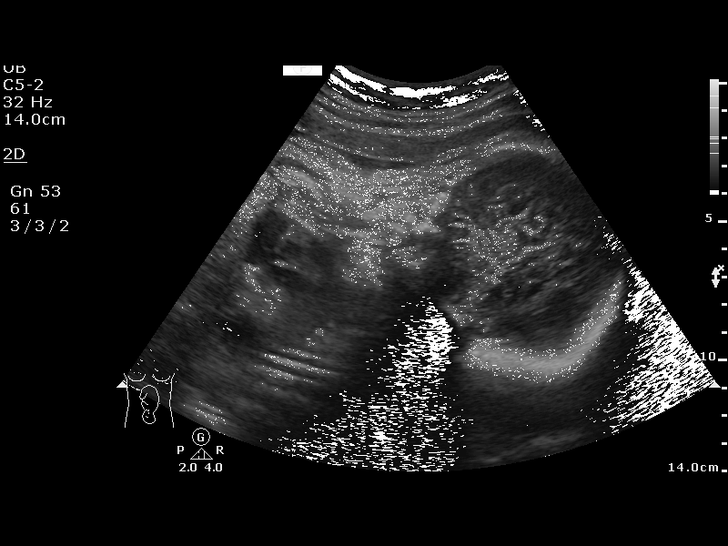
[im 8/13]
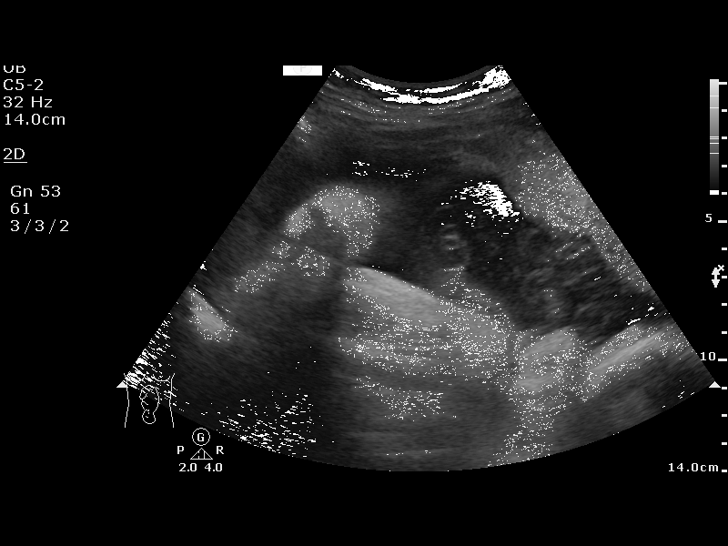
[im 9/13]
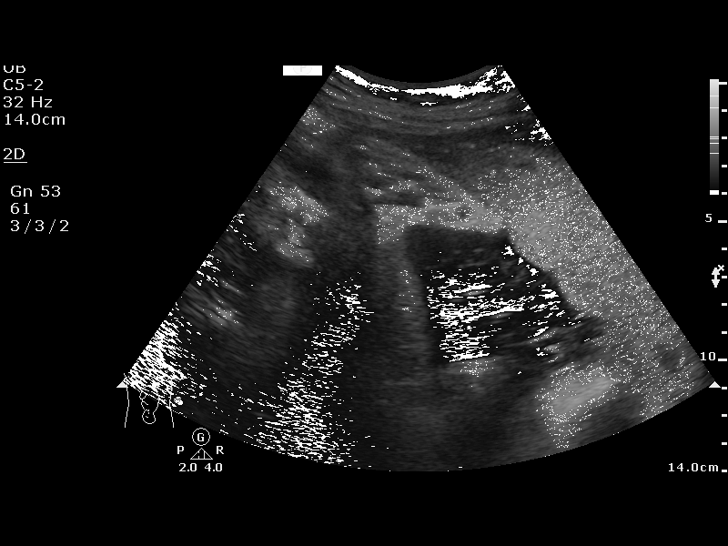
[im 10/13]
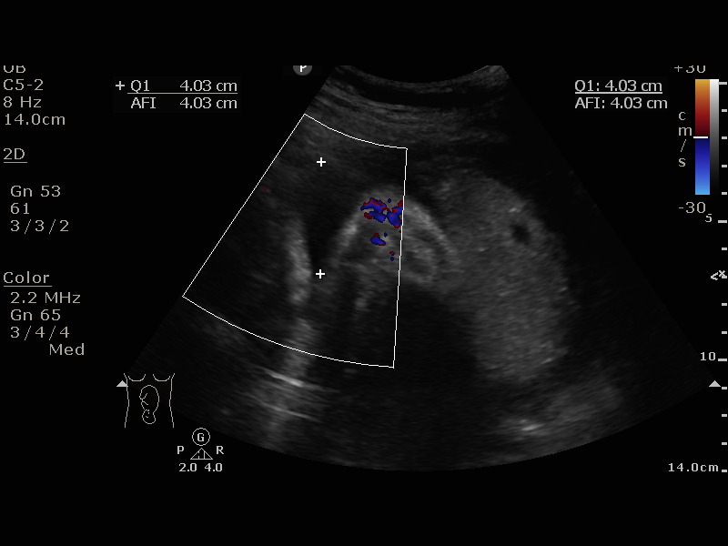
[im 11/13]
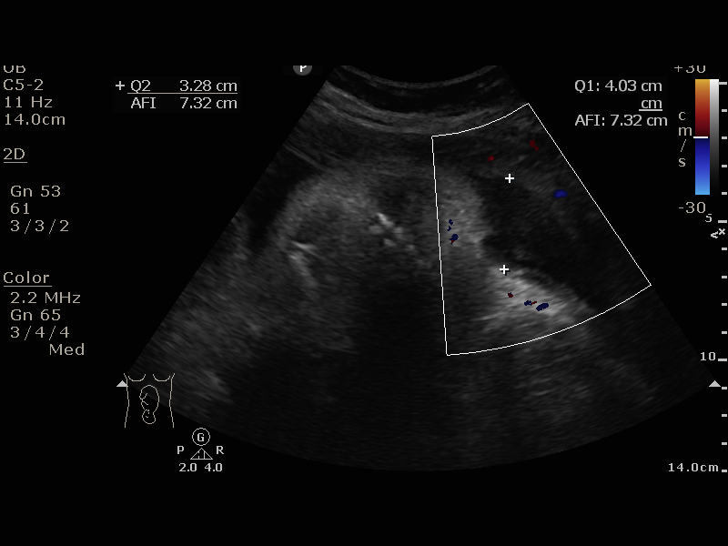
[im 12/13]
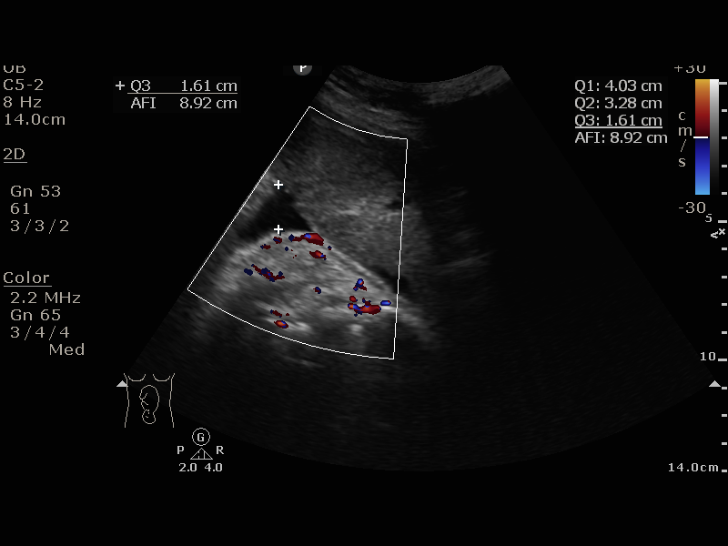
[im 13/13]
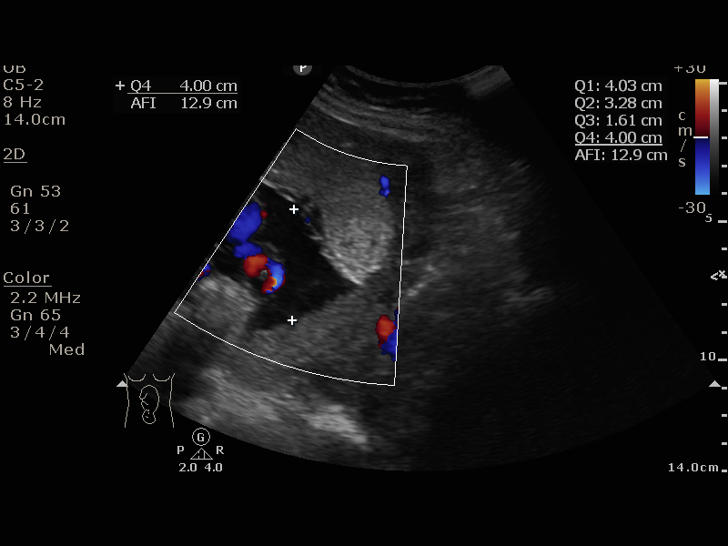

[13 of 13 positions shown; findings below may reference images not displayed]

Women's
[REDACTED]

1  US FETAL BPP W/NONSTRESS                    76818.4

1  HARDIJS ADDIE              [id number]      [id number]     [id number]
Service(s) Provided

Indications

35 weeks gestation of pregnancy
Pre-existing diabetes, type 2, in pregnancy,
third trimester
Advanced maternal age multigravida 35+,
third trimester
OB History

Blood Type:            Height:  4'11"  Weight (lb):  142       BMI:
Gravidity:    7         Term:   6
Living:       6
Fetal Evaluation

Num Of Fetuses:     1
Preg. Location:     Intrauterine
Cardiac Activity:   Observed
Presentation:       Cephalic

Amniotic Fluid
AFI FV:      Subjectively within normal limits

AFI Sum(cm)     %Tile       Largest Pocket(cm)
12.92           42

RUQ(cm)       RLQ(cm)       LUQ(cm)        LLQ(cm)
4.03          4
Biophysical Evaluation

Amniotic F.V:   Pocket => 2 cm two         F. Tone:        Observed
planes
F. Movement:    Observed                   N.S.T:          Reactive
F. Breathing:   Observed                   Score:          [DATE]
Gestational Age

LMP:           35w 2d        Date:  12/22/16                 EDD:   09/28/17
Best:          35w 2d     Det. By:  LMP  (12/22/16)          EDD:   09/28/17
Impression

Recommendations

Cont weekly testing

## 2019-08-08 ENCOUNTER — Other Ambulatory Visit: Payer: Self-pay

## 2019-08-08 DIAGNOSIS — Z20822 Contact with and (suspected) exposure to covid-19: Secondary | ICD-10-CM

## 2019-08-09 LAB — NOVEL CORONAVIRUS, NAA: SARS-CoV-2, NAA: DETECTED — AB

## 2021-01-20 ENCOUNTER — Emergency Department (HOSPITAL_COMMUNITY): Payer: Medicaid Other

## 2021-01-20 ENCOUNTER — Inpatient Hospital Stay (HOSPITAL_COMMUNITY)
Admission: EM | Admit: 2021-01-20 | Discharge: 2021-01-22 | DRG: 872 | Disposition: A | Discharge status: Home / Self Care | Payer: Medicaid Other | Attending: Family Medicine | Admitting: Family Medicine

## 2021-01-20 ENCOUNTER — Other Ambulatory Visit: Payer: Self-pay

## 2021-01-20 ENCOUNTER — Encounter (HOSPITAL_COMMUNITY): Payer: Self-pay | Admitting: Emergency Medicine

## 2021-01-20 DIAGNOSIS — E1142 Type 2 diabetes mellitus with diabetic polyneuropathy: Secondary | ICD-10-CM | POA: Diagnosis present

## 2021-01-20 DIAGNOSIS — Z91199 Patient's noncompliance with other medical treatment and regimen due to unspecified reason: Secondary | ICD-10-CM

## 2021-01-20 DIAGNOSIS — E871 Hypo-osmolality and hyponatremia: Secondary | ICD-10-CM | POA: Diagnosis present

## 2021-01-20 DIAGNOSIS — R739 Hyperglycemia, unspecified: Secondary | ICD-10-CM

## 2021-01-20 DIAGNOSIS — Z9119 Patient's noncompliance with other medical treatment and regimen: Secondary | ICD-10-CM | POA: Diagnosis not present

## 2021-01-20 DIAGNOSIS — E1165 Type 2 diabetes mellitus with hyperglycemia: Secondary | ICD-10-CM | POA: Diagnosis present

## 2021-01-20 DIAGNOSIS — Z833 Family history of diabetes mellitus: Secondary | ICD-10-CM

## 2021-01-20 DIAGNOSIS — Z20822 Contact with and (suspected) exposure to covid-19: Secondary | ICD-10-CM | POA: Diagnosis present

## 2021-01-20 DIAGNOSIS — Z794 Long term (current) use of insulin: Secondary | ICD-10-CM

## 2021-01-20 DIAGNOSIS — L03115 Cellulitis of right lower limb: Secondary | ICD-10-CM | POA: Diagnosis present

## 2021-01-20 DIAGNOSIS — E7800 Pure hypercholesterolemia, unspecified: Secondary | ICD-10-CM | POA: Diagnosis present

## 2021-01-20 DIAGNOSIS — E11628 Type 2 diabetes mellitus with other skin complications: Secondary | ICD-10-CM | POA: Diagnosis present

## 2021-01-20 DIAGNOSIS — Z7982 Long term (current) use of aspirin: Secondary | ICD-10-CM | POA: Diagnosis not present

## 2021-01-20 DIAGNOSIS — Z7984 Long term (current) use of oral hypoglycemic drugs: Secondary | ICD-10-CM | POA: Diagnosis not present

## 2021-01-20 DIAGNOSIS — A419 Sepsis, unspecified organism: Secondary | ICD-10-CM | POA: Diagnosis present

## 2021-01-20 DIAGNOSIS — Z23 Encounter for immunization: Secondary | ICD-10-CM | POA: Diagnosis not present

## 2021-01-20 DIAGNOSIS — M79671 Pain in right foot: Secondary | ICD-10-CM

## 2021-01-20 DIAGNOSIS — Z597 Insufficient social insurance and welfare support: Secondary | ICD-10-CM

## 2021-01-20 DIAGNOSIS — E119 Type 2 diabetes mellitus without complications: Secondary | ICD-10-CM

## 2021-01-20 DIAGNOSIS — L089 Local infection of the skin and subcutaneous tissue, unspecified: Secondary | ICD-10-CM

## 2021-01-20 LAB — RESP PANEL BY RT-PCR (FLU A&B, COVID) ARPGX2
Influenza A by PCR: NEGATIVE
Influenza B by PCR: NEGATIVE
SARS Coronavirus 2 by RT PCR: NEGATIVE

## 2021-01-20 LAB — CBC WITH DIFFERENTIAL/PLATELET
Abs Immature Granulocytes: 0.09 10*3/uL — ABNORMAL HIGH (ref 0.00–0.07)
Abs Immature Granulocytes: 0.13 10*3/uL — ABNORMAL HIGH (ref 0.00–0.07)
Basophils Absolute: 0.1 10*3/uL (ref 0.0–0.1)
Basophils Absolute: 0.1 10*3/uL (ref 0.0–0.1)
Basophils Relative: 0 %
Basophils Relative: 0 %
Eosinophils Absolute: 0.1 10*3/uL (ref 0.0–0.5)
Eosinophils Absolute: 0.2 10*3/uL (ref 0.0–0.5)
Eosinophils Relative: 1 %
Eosinophils Relative: 1 %
HCT: 31.3 % — ABNORMAL LOW (ref 36.0–46.0)
HCT: 35.4 % — ABNORMAL LOW (ref 36.0–46.0)
Hemoglobin: 10.5 g/dL — ABNORMAL LOW (ref 12.0–15.0)
Hemoglobin: 11.4 g/dL — ABNORMAL LOW (ref 12.0–15.0)
Immature Granulocytes: 1 %
Immature Granulocytes: 1 %
Lymphocytes Relative: 10 %
Lymphocytes Relative: 7 %
Lymphs Abs: 1.4 10*3/uL (ref 0.7–4.0)
Lymphs Abs: 1.9 10*3/uL (ref 0.7–4.0)
MCH: 28.1 pg (ref 26.0–34.0)
MCH: 28.8 pg (ref 26.0–34.0)
MCHC: 32.2 g/dL (ref 30.0–36.0)
MCHC: 33.5 g/dL (ref 30.0–36.0)
MCV: 85.8 fL (ref 80.0–100.0)
MCV: 87.4 fL (ref 80.0–100.0)
Monocytes Absolute: 1.2 10*3/uL — ABNORMAL HIGH (ref 0.1–1.0)
Monocytes Absolute: 1.4 10*3/uL — ABNORMAL HIGH (ref 0.1–1.0)
Monocytes Relative: 6 %
Monocytes Relative: 8 %
Neutro Abs: 15 10*3/uL — ABNORMAL HIGH (ref 1.7–7.7)
Neutro Abs: 18.7 10*3/uL — ABNORMAL HIGH (ref 1.7–7.7)
Neutrophils Relative %: 80 %
Neutrophils Relative %: 85 %
Platelets: 379 10*3/uL (ref 150–400)
Platelets: 394 10*3/uL (ref 150–400)
RBC: 3.65 MIL/uL — ABNORMAL LOW (ref 3.87–5.11)
RBC: 4.05 MIL/uL (ref 3.87–5.11)
RDW: 14.6 % (ref 11.5–15.5)
RDW: 14.7 % (ref 11.5–15.5)
WBC: 18.6 10*3/uL — ABNORMAL HIGH (ref 4.0–10.5)
WBC: 21.6 10*3/uL — ABNORMAL HIGH (ref 4.0–10.5)
nRBC: 0 % (ref 0.0–0.2)
nRBC: 0 % (ref 0.0–0.2)

## 2021-01-20 LAB — GLUCOSE, CAPILLARY: Glucose-Capillary: 242 mg/dL — ABNORMAL HIGH (ref 70–99)

## 2021-01-20 LAB — LACTIC ACID, PLASMA
Lactic Acid, Venous: 1.3 mmol/L (ref 0.5–1.9)
Lactic Acid, Venous: 1.2 mmol/L (ref 0.5–1.9)

## 2021-01-20 LAB — BASIC METABOLIC PANEL
Anion gap: 13 (ref 5–15)
BUN: 13 mg/dL (ref 6–20)
CO2: 18 mmol/L — ABNORMAL LOW (ref 22–32)
Calcium: 8.2 mg/dL — ABNORMAL LOW (ref 8.9–10.3)
Chloride: 99 mmol/L (ref 98–111)
Creatinine, Ser: 0.69 mg/dL (ref 0.44–1.00)
GFR, Estimated: >60 mL/min (ref >60)
Glucose, Bld: 385 mg/dL — ABNORMAL HIGH (ref 70–99)
Potassium: 3.9 mmol/L (ref 3.5–5.1)
Sodium: 130 mmol/L — ABNORMAL LOW (ref 135–145)

## 2021-01-20 LAB — SEDIMENTATION RATE: Sed Rate: 50 mm/hr — ABNORMAL HIGH (ref 0–22)

## 2021-01-20 LAB — CBG MONITORING, ED: Glucose-Capillary: 262 mg/dL — ABNORMAL HIGH (ref 70–99)

## 2021-01-20 LAB — HCG, QUANTITATIVE, PREGNANCY: hCG, Beta Chain, Quant, S: 4 m[IU]/mL (ref <5)

## 2021-01-20 MED ORDER — ACETAMINOPHEN 650 MG RE SUPP: 650.0000 mg | Freq: Four times a day (QID) | RECTAL | Status: DC | PRN

## 2021-01-20 MED ORDER — TETANUS-DIPHTH-ACELL PERTUSSIS 5-2.5-18.5 LF-MCG/0.5 IM SUSY
0.5000 mL | PREFILLED_SYRINGE | Freq: Once | INTRAMUSCULAR | Status: AC
  Administered 2021-01-20: 0.5 mL via INTRAMUSCULAR
  Filled 2021-01-20: qty 0.5

## 2021-01-20 MED ORDER — LACTATED RINGERS IV SOLN: INTRAVENOUS | Status: AC

## 2021-01-20 MED ORDER — VANCOMYCIN HCL 1000 MG/200ML IV SOLN: 1000.0000 mg | Freq: Once | INTRAVENOUS | Status: DC

## 2021-01-20 MED ORDER — LACTATED RINGERS IV BOLUS (SEPSIS)
1000.0000 mL | Freq: Once | INTRAVENOUS | Status: AC
  Administered 2021-01-20: 1000 mL via INTRAVENOUS

## 2021-01-20 MED ORDER — SODIUM CHLORIDE 0.9 % IV SOLN
2.0000 g | Freq: Once | INTRAVENOUS | Status: AC
  Administered 2021-01-20: 2 g via INTRAVENOUS
  Filled 2021-01-20: qty 20

## 2021-01-20 MED ORDER — SODIUM CHLORIDE 0.9 % IV SOLN
1.0000 g | INTRAVENOUS | Status: DC
  Administered 2021-01-21: 1 g via INTRAVENOUS
  Filled 2021-01-20: qty 1
  Filled 2021-01-20: qty 10

## 2021-01-20 MED ORDER — FENTANYL CITRATE (PF) 100 MCG/2ML IJ SOLN
50.0000 ug | INTRAMUSCULAR | Status: DC | PRN
  Administered 2021-01-21: 50 ug via INTRAVENOUS
  Filled 2021-01-20: qty 2

## 2021-01-20 MED ORDER — VANCOMYCIN HCL 1250 MG/250ML IV SOLN
1250.0000 mg | Freq: Once | INTRAVENOUS | Status: AC
  Administered 2021-01-20: 1250 mg via INTRAVENOUS
  Filled 2021-01-20: qty 250

## 2021-01-20 MED ORDER — INSULIN ASPART 100 UNIT/ML IJ SOLN
0.0000 [IU] | Freq: Four times a day (QID) | INTRAMUSCULAR | Status: DC
  Administered 2021-01-20: 2 [IU] via SUBCUTANEOUS
  Administered 2021-01-20 – 2021-01-21 (×3): 3 [IU] via SUBCUTANEOUS
  Administered 2021-01-21: 2 [IU] via SUBCUTANEOUS
  Administered 2021-01-21: 3 [IU] via SUBCUTANEOUS
  Administered 2021-01-22: 4 [IU] via SUBCUTANEOUS
  Administered 2021-01-22: 2 [IU] via SUBCUTANEOUS

## 2021-01-20 MED ORDER — VANCOMYCIN HCL 1250 MG/250ML IV SOLN
1250.0000 mg | INTRAVENOUS | Status: DC
  Administered 2021-01-21: 1250 mg via INTRAVENOUS
  Filled 2021-01-20: qty 250

## 2021-01-20 MED ORDER — ACETAMINOPHEN 325 MG PO TABS
650.0000 mg | ORAL_TABLET | Freq: Four times a day (QID) | ORAL | Status: DC | PRN
  Administered 2021-01-21 – 2021-01-22 (×2): 650 mg via ORAL
  Filled 2021-01-20 (×2): qty 2

## 2021-01-20 NOTE — ED Provider Notes (Signed)
[facility] EMERGENCY DEPARTMENT Provider Note   CSN: [id number] Arrival date & time: 01/20/21  1440     History No chief complaint on file.   Michele Morales is a 45 y.o. female with past medical history of diabetes, who presents today for evaluation of pain and redness in her right foot. She states that yesterday she noted increasing pain on the right foot with swelling.  She denies any known injury and is not sure when she got the skin break on the bottom of her foot. She states that she does not have a doctor and has not had any medicines for her diabetes in about 3 years.  She reports chills, she has not checked her temperature at home.  She reports that she generally feels fatigued. She states that her pain is made worse with weightbearing movement and touch.  Pain is made slightly better with being still.  She has no history of foot wound/infection. She denies any recent sick contacts including no reported [place] contacts. She denies any allergies. Interactions with patient performed through professional Spanish-speaking medical language interpreter.  HPI     Past Medical History:  Diagnosis Date  . Diabetes mellitus without complication (HCC)    IDDM  . Hypercholesteremia     Patient Active Problem List   Diagnosis Date Noted  . Cellulitis of right lower extremity 01/20/2021  . [place] multipara 07/10/2017  . Language barrier 05/07/2017  . Diabetes mellitus affecting pregnancy, antepartum 04/23/2017    Past Surgical History:  Procedure Laterality Date  . NO PAST SURGERIES       OB History    Gravida  7   Para  7   Term  7   Preterm      AB      Living  7     SAB      IAB      Ectopic      Multiple  0   Live Births  7           Family History  Problem Relation Age of Onset  . Diabetes Mother     Social History   Tobacco Use  . Smoking status: Never Smoker  . Smokeless tobacco: Never Used  Substance Use Topics  .  Alcohol use: No  . Drug use: No    Home Medications Prior to Admission medications   Medication Sig Start Date End Date Taking? Authorizing Provider  aspirin EC 81 MG tablet Take 1 tablet (81 mg total) by mouth daily. 05/07/17   Aletha Halim, MD  ibuprofen (ADVIL,MOTRIN) 600 MG tablet Take 1 tablet (600 mg total) by mouth every 6 (six) hours. 09/12/17   Larwance Rote, MD  metFORMIN (GLUCOPHAGE) 1000 MG tablet Take 1 tablet (1,000 mg total) by mouth 2 (two) times daily with a meal. 09/12/17   Larwance Rote, MD    Allergies    Patient has no known allergies.  Review of Systems   Review of Systems  Constitutional: Positive for chills and fatigue.  HENT: Negative for congestion.   Respiratory: Negative for chest tightness and shortness of breath.   Cardiovascular: Negative for chest pain.  Gastrointestinal: Negative for abdominal pain.  Musculoskeletal:       Swelling of right foot  Skin: Positive for color change and wound.  Neurological: Negative for weakness, numbness and headaches.  All other systems reviewed and are negative.   Physical Exam Updated Vital Signs BP (!) 148/88  Pulse (!) 117   Temp 98.4 F (36.9 C) (Oral)   Resp 17   LMP 01/18/2021   SpO2 100%   Physical Exam Vitals and nursing note reviewed.  Constitutional:      General: She is not in acute distress.    Appearance: She is not diaphoretic.  HENT:     Head: Normocephalic and atraumatic.  Eyes:     General: No scleral icterus.       Right eye: No discharge.        Left eye: No discharge.     Conjunctiva/sclera: Conjunctivae normal.  Cardiovascular:     Rate and Rhythm: Regular rhythm. Tachycardia present.  Pulmonary:     Effort: Pulmonary effort is normal. No respiratory distress.     Breath sounds: No stridor.  Abdominal:     General: There is no distension.  Musculoskeletal:        General: No deformity.     Cervical back: Normal range of motion and neck supple.     Comments:  Obvious edema on the right foot primarily on the distal lateral aspect.  Edema does extend into the foot and towards the ankle.  Patient is able to move her toes.  Skin:    General: Skin is warm and dry.     Comments: Please see clinical images.  There is diffuse hot, erythematous induration around the right foot.  There is a sub cm wound on the plantar surface of the foot included in the erythematous area.  No active drainage.  There is a larger about 1.5 cm area in the medial aspect of the right heel that is discolored dark.  This area is nontender without drainage  Neurological:     Mental Status: She is alert.     Sensory: No sensory deficit (Sensation intact to light touch to toes on right foot.).     Motor: No abnormal muscle tone.  Psychiatric:        Behavior: Behavior normal.             ED Results / Procedures / Treatments   Labs (all labs ordered are listed, but only abnormal results are displayed) Labs Reviewed  BASIC METABOLIC PANEL - Abnormal; Notable for the following components:      Result Value   Sodium 130 (*)    CO2 18 (*)    Glucose, Bld 385 (*)    Calcium 8.2 (*)    All other components within normal limits  CBC WITH DIFFERENTIAL/PLATELET - Abnormal; Notable for the following components:   WBC 21.6 (*)    Hemoglobin 11.4 (*)    HCT 35.4 (*)    Neutro Abs 18.7 (*)    Monocytes Absolute 1.2 (*)    Abs Immature Granulocytes 0.13 (*)    All other components within normal limits  SEDIMENTATION RATE - Abnormal; Notable for the following components:   Sed Rate 50 (*)    All other components within normal limits  CBG MONITORING, ED - Abnormal; Notable for the following components:   Glucose-Capillary 262 (*)    All other components within normal limits  RESP PANEL BY RT-PCR (FLU A&B, COVID) ARPGX2  CULTURE, BLOOD (ROUTINE X 2)  CULTURE, BLOOD (ROUTINE X 2)  URINE CULTURE  HCG, QUANTITATIVE, PREGNANCY  LACTIC ACID, PLASMA  LACTIC ACID, PLASMA   HEMOGLOBIN A1C  URINALYSIS, ROUTINE W REFLEX MICROSCOPIC  C-REACTIVE PROTEIN  HIV ANTIBODY (ROUTINE TESTING W REFLEX)  MAGNESIUM  HEMOGLOBIN A1C  COMPREHENSIVE METABOLIC  PANEL  MAGNESIUM  CBC WITH DIFFERENTIAL/PLATELET    EKG None  Radiology DG Chest 2 View  Result Date: 01/20/2021 CLINICAL DATA:  Possible sepsis EXAM: CHEST - 2 VIEW COMPARISON:  04/04/2016 FINDINGS: The heart size and mediastinal contours are within normal limits. Both lungs are clear. The visualized skeletal structures are unremarkable. IMPRESSION: No active cardiopulmonary disease. Electronically Signed   By: Inez Catalina M.D.   On: 01/20/2021 18:50   DG Foot Complete Right  Result Date: 01/20/2021 CLINICAL DATA:  Right foot pain and swelling for 2 days.  No injury. EXAM: RIGHT FOOT COMPLETE - 3+ VIEW COMPARISON:  None. FINDINGS: No fracture or bone lesion. Joints are normally spaced and aligned.  No arthropathic changes. Soft tissue swelling most evident over the dorsal forefoot. No soft tissue air or radiopaque foreign body. IMPRESSION: 1. No fracture, bone lesion or joint abnormality. 2. Forefoot soft tissue swelling. Electronically Signed   By: Lajean Manes M.D.   On: 01/20/2021 18:51    Procedures .Critical Care Performed by: Lorin Glass, PA-C Authorized by: Lorin Glass, PA-C   Critical care provider statement:    Critical care time (minutes):  45   Critical care time was exclusive of:  Separately billable procedures and treating other patients and teaching time   Critical care was necessary to treat or prevent imminent or life-threatening deterioration of the following conditions:  Sepsis   Critical care was time spent personally by me on the following activities:  Discussions with consultants, evaluation of patient's response to treatment, examination of patient, ordering and performing treatments and interventions, ordering and review of laboratory studies, ordering and review of  radiographic studies, pulse oximetry, re-evaluation of patient's condition, obtaining history from patient or surrogate and review of old charts     Medications Ordered in ED Medications  lactated ringers infusion ( Intravenous New Bag/Given 01/20/21 1910)  vancomycin (VANCOREADY) IVPB 1250 mg/250 mL (1,250 mg Intravenous New Bag/Given 01/20/21 1909)  vancomycin (VANCOREADY) IVPB 1250 mg/250 mL (has no administration in time range)            Tdap (BOOSTRIX) injection 0.5 mL (0.5 mLs Intramuscular Given 01/20/21 1752)  lactated ringers bolus 1,000 mL (0 mLs Intravenous Stopped 01/20/21 1903)  cefTRIAXone (ROCEPHIN) 2 g in sodium chloride 0.9 % 100 mL IVPB (0 g Intravenous Stopped 01/20/21 1820)    ED Course  I have reviewed the triage vital signs and the nursing notes.  Pertinent labs & imaging results that were available during my care of the patient were reviewed by me and considered in my medical decision making (see chart for details).  Clinical Course as of 01/20/21 2015  Sun Jan 20, 2021  1711  Saw patient, code sepsis called.  [EH]  1919 I spoke with Dr. Doreatha Martin of orthopedics, he will view picutres [EH]  1925 Per Dr. Doreatha Martin "I looked at clinical pics and I appreciate your concern. I would recommend continuing the iv antibiotics and we will see her in the AM. I dont think the way it looks that it will progress rapidly to where intervention is needed tonight. We can always adress in the AM as needed." [EH]    Clinical Course User Index [EH] Ollen Gross   MDM Rules/Calculators/A&P                         Patient is a Spanish-speaking 45 year old woman who is not on any current treatment for diabetes  who presents today for evaluation of foot infection worsening since yesterday. At the time of my initial evaluation she is noted to be tachycardic with a heart rate in the 120s with leukocytosis with a white count of 21.6. Code sepsis is called, broad-spectrum  antibiotics are ordered.    CBC shows she is mildly anemic with a hemoglobin of 11.4. BMP shows hyponatremia with a sodium of 130, hyperglycemia with a glucose of 385. 1 L of IV fluids is ordered.  She does not require full 30/kg while in my care as her lactic acid was not elevated over 4 and she was not hypotensive. Pregnancy test is negative.  A1c is sent.  Tdap is updated. X-ray of the foot does not show foreign body, subcutaneous air or other abnormality aside from soft tissue swelling. After 1 L of fluids her blood glucose came down to 262.   ESR and CRP is sent. Given her combination of leukocytosis, meeting sepsis criteria, hyponatremia, anemia, I spoke with Dr. Doreatha Martin of orthopedics as this combination of derangements in the setting of infection could be indicative of necrotizing fasciitis. I had asked the nurse to outline the erythematous area. Dr. Doreatha Martin recommended IV antibiotics, and that they will reassess in the morning.  I spoke with hospitalist who will see patient for admission.  The patient appears reasonably stabilized for admission considering the current resources, flow, and capabilities available in the ED at this time, and I doubt any other [facility] requiring further screening and/or treatment in the ED prior to admission assuming timely admission and bed placement.  Note: Portions of this report may have been transcribed using voice recognition software. Every effort was made to ensure accuracy; however, inadvertent computerized transcription errors may be present  Final Clinical Impression(s) / ED Diagnoses Final diagnoses:  Sepsis without acute organ dysfunction, due to unspecified organism ([place])  Hyperglycemia  Diabetic foot infection ([place])  Patient's noncompliance with other medical treatment and regimen    Rx / DC Orders ED Discharge Orders    None       Ollen Gross 01/20/21 2018    Breck Coons, MD 01/20/21 2335

## 2021-01-20 NOTE — ED Notes (Signed)
Ana interpretor [id number] for IV, meds, allergies and plan of care

## 2021-01-20 NOTE — ED Triage Notes (Signed)
C/o R foot pain, redness, and swelling x 2 days.  Denies injury.

## 2021-01-20 NOTE — ED Provider Notes (Signed)
Emergency Medicine Provider Triage Evaluation Note  Michele Morales , a 45 y.o. female  was evaluated in triage.  Pt complains of R foot pain.  Review of Systems  Positive: R foot pain, swelling, redness Negative: Fever, injury  Physical Exam  BP 137/82 (BP Location: Left Arm)   Pulse (!) 120   Temp 99.4 F (37.4 C)   Resp 18  Gen:   Awake, no distress   Resp:  Normal effort  MSK:   Moves extremities without difficulty  Other:  R foot: area of induration, erythema and fluctuance noted to lateral foot. Exquisite tenderness  Medical Decision Making  Medically screening exam initiated at 3:16 PM.  Appropriate orders placed.  [name] was informed that the remainder of the evaluation will be completed by another provider, this initial triage assessment does not replace that evaluation, and the importance of remaining in the ED until their evaluation is complete.  Pain and swelling to R foot consistent with skin abscess with surround cellulitis.    Fayrene Helper, PA-C 01/20/21 1517    Terrilee Files, MD 01/21/21 [phone]

## 2021-01-20 NOTE — Progress Notes (Signed)
Pharmacy Antibiotic Note  Michele Morales is a 45 y.o. female admitted on 01/20/2021 with cellulitis.  Pharmacy has been consulted for vancomycin dosing.  WBC 21.6. SCr wnl, AF   Plan: -Vancomycin 1500 mg IV load followed by Vancomycin 1250 mg IV Q 24 hrs. Goal AUC 400-550. Expected AUC: 513 SCr used: 0.7 -Monitor CBC, renal fx, cultures and clinical progress -Vanc levels as indicated       Temp (24hrs), Avg:99.4 F (37.4 C), Min:99.4 F (37.4 C), Max:99.4 F (37.4 C)  Recent Labs  Lab 01/20/21 1604  WBC 21.6*  CREATININE 0.69    CrCl cannot be calculated (Unknown ideal weight.).    No Known Allergies  Antimicrobials this admission: Ceftriaxone 5/29 >>  Vancomycin 5/29  >>   Dose adjustments this admission:   Microbiology results:  Thank you for allowing pharmacy to be a part of this patient's care.  Vinnie Level, PharmD., BCPS, BCCCP Clinical Pharmacist Please refer to [facility] for unit-specific pharmacist

## 2021-01-20 NOTE — Progress Notes (Signed)
Pt. Arrived via stretcher from ED. CCMD notified with VSS. GCS-15 with no c/o pain. Lt. Foot marked and will continue to monitor closely.MD orders carried out.

## 2021-01-20 NOTE — ED Notes (Signed)
[id number] interpretor Caryn Bee ask if there were anymore questions

## 2021-01-20 NOTE — ED Notes (Signed)
Attempted report x1. 

## 2021-01-20 NOTE — ED Notes (Signed)
Per MD note, pt foot has been marked with marker. Area of erythema noted on RT foot.

## 2021-01-20 NOTE — H&P (Signed)
History and Physical    PLEASE NOTE THAT DRAGON DICTATION SOFTWARE WAS USED IN THE CONSTRUCTION OF THIS NOTE.   Michele Morales BEM:[id number] DOB: 01-06-1976 DOA: 01/20/2021  PCP: Patient, No Pcp Per (Inactive) Patient coming from: home   I have personally briefly reviewed patient's old medical records in [place]  Chief Complaint: Right foot pain  HPI: Michele Morales is a 45 y.o. female with medical history significant for type 2 diabetes mellitus, who is admitted to [facility] on 01/20/2021 with sepsis due to right lower extremity cellulitis after presenting from home to [facility] ED complaining of right foot pain.   Of note, my discussions with this patient were assisted by profession translator service in setting of patient being Spanish speaking predominant.   The patient reports 2 days of progressive right foot pain associated with erythema, swelling, and increased warmth to touch over the dorsal aspect.  Denies any associated drainage, including no evidence of purulent drainage.  Denies any preceding trauma or crush injury.  She denies any known recent break in the skin integrity associate with the right lower extremity, and is unaware of a small wound on the plantar aspect of her right foot.  Denies any associated numbness or paresthesias.  Denies any known history of cellulitis.  She reports associated subjective fever, but denies chills, full body rigors, or generalized myalgias.  No recent traveling.  Denies any associated nausea, vomiting, diarrhea, or abdominal pain.  She also denies any recent headache, neck stiffness, rhinitis, rhinorrhea, sore throat, shortness of breath, wheezing, cough, or rash in any other location.  No known COVID-19 exposures.  Denies any recent dysuria, gross hematuria, or change in urinary urgency/frequency.  She also denies any recent chest pain, diaphoresis, palpitations, dizziness, presyncope, or syncope.  She acknowledges a history of diabetes  mellitus, reporting that she was previously on regular insulin once a day as well as metformin, but conveys that she has been completely off of all insulin and all oral hypoglycemic agents over the last 3 years, confirming no pharmacologic intervention in the interval.  He states that this is as a result of not actively receiving routine medical care over that time.  While she reports a history of gestational diabetes, she also confirms her diagnosis of diabetes predated this. She reports that she is amenable to resumption of pharmacologic diabetic intervention, and is also amenable to meeting with the diabetic educator.  I will plan denies any recreational drug use or routine/recent alcohol consumption.     ED Course:  Vital signs in the ED were notable for the following:  - Temperature max 99.4, initial heart rate 120 which is decreased to 117 following interval IV fluids, as further described low; blood pressure 132/82 148/88; respiratory rate 15-18, oxygen saturation 96 to 100% on room air.  Labs were notable for the following: BMP was notable for the following: Sodium 130, which corrects to 135 in the setting of concomitant hyperglycemia, potassium 3.9, bicarbonate 18, anion gap 13, creatinine 0.69.  Initial glucose 385 by way BMP, with POC glucose performed after interval IV fluid administration demonstrating a trend down to 262.  Urinalysis has been ordered, with result currently pending.  CBC notable for white blood cell count of 21,000 with 85% neutrophils.  Initial lactate found to be 1.3, with repeat trending down to 1.2.  ESR 50.  CRP has been ordered, with result currently pending.  Nasopharyngeal COVID-19/influenza PCR were checked in emergency department today and found to be  negative.  Blood cultures x2 were collected prior to initiation of IV antibiotics.  EKG shows sinus tachycardia with heart rate 115, normal intervals, nonspecific T wave inversion in V2 and no evidence of ST changes,  including no evidence of ST elevation.  Chest x-ray showed no evidence of acute cardiopulmonary process.  Plain films of the right foot showed soft tissue swelling most prominent over the dorsal forefoot without evidence of subcutaneous gas or foreign body, and in the absence of fracture.  EDP discussed the patient's case with the on-call orthopedic surgeon, Dr. Doreatha Martin, who reviewed photographs of the patient's right foot that have been uploaded to her EMR, and did not feel that based on this history, reported physical exam, and the appearance of these photos, that this presentation was consistent with necrotizing fasciitis.  However, Dr. Doreatha Martin will formally consult and plans to evaluate the patient in the morning.  He requests that the patient be kept n.p.o. in the interval.   While in the ED, the following were administered: Rocephin 2 g IV x1, IV vancomycin x1 dose, lactated Ringer's x1 L bolus followed by initiation of continuous lactated Ringer's at 150 cc/h.     Review of Systems: As per HPI otherwise 10 point review of systems negative.   Past Medical History:  Diagnosis Date  . Diabetes mellitus without complication (HCC)    IDDM  . Hypercholesteremia     Past Surgical History:  Procedure Laterality Date  . NO PAST SURGERIES      Social History:  reports that she has never smoked. She has never used smokeless tobacco. She reports that she does not drink alcohol and does not use drugs.   No Known Allergies  Family History  Problem Relation Age of Onset  . Diabetes Mother     Family history reviewed and not pertinent    Prior to Admission medications   Medication Sig Start Date End Date Taking? Authorizing Provider  aspirin EC 81 MG tablet Take 1 tablet (81 mg total) by mouth daily. 05/07/17   Aletha Halim, MD  ibuprofen (ADVIL,MOTRIN) 600 MG tablet Take 1 tablet (600 mg total) by mouth every 6 (six) hours. 09/12/17   Larwance Rote, MD  metFORMIN (GLUCOPHAGE) 1000  MG tablet Take 1 tablet (1,000 mg total) by mouth 2 (two) times daily with a meal. 09/12/17   Larwance Rote, MD     Objective    Physical Exam: Vitals:   01/20/21 1830 01/20/21 1900 01/20/21 1930 01/20/21 2000  BP: (!) 141/79 (!) 148/88 (!) 142/84 132/81  Pulse: (!) 118 (!) 117 (!) 120 (!) 117  Resp: 18 17 19 18   Temp:      TempSrc:      SpO2: 100% 100% 100% 100%    General: appears to be stated age; alert, oriented Skin: warm, dry; erythema, increased warmth, tenderness, swelling over the dorsal aspect of the right foot, most prominent over the lateral aspect, with additional, but not as prominent erythema, increased warmth over a small wound over the lateral aspect of the plantar surface of the right foot.  No drainage noted. Head:  AT/[place] Mouth:  Oral mucosa membranes appear dry, normal dentition Neck: supple; trachea midline Heart: Mildly tachycardic, but regular; did not appreciate any M/R/G Lungs: CTAB, did not appreciate any wheezes, rales, or rhonchi Abdomen: + BS; soft, ND, NT Vascular: 2+ pedal pulses b/l; 2+ radial pulses b/l Extremities: Erythema, increased warmth, tenderness, swelling associated with portions of the dorsal and plantar  aspect of the right foot, as further detailed above, no muscle wasting Neuro: strength and sensation intact in upper and lower extremities b/l    Labs on Admission: I have personally reviewed following labs and imaging studies  CBC: Recent Labs  Lab 01/20/21 1604  WBC 21.6*  NEUTROABS 18.7*  HGB 11.4*  HCT 35.4*  MCV 87.4  PLT 443   Basic Metabolic Panel: Recent Labs  Lab 01/20/21 1604  NA 130*  K 3.9  CL 99  CO2 18*  GLUCOSE 385*  BUN 13  CREATININE 0.69  CALCIUM 8.2*   GFR: CrCl cannot be calculated (Unknown ideal weight.). Liver Function Tests: No results for input(s): AST, ALT, ALKPHOS, BILITOT, PROT, ALBUMIN in the last 168 hours. No results for input(s): LIPASE, AMYLASE in the last 168 hours. No results  for input(s): AMMONIA in the last 168 hours. Coagulation Profile: No results for input(s): INR, PROTIME in the last 168 hours. Cardiac Enzymes: No results for input(s): CKTOTAL, CKMB, CKMBINDEX, TROPONINI in the last 168 hours. BNP (last 3 results) No results for input(s): PROBNP in the last 8760 hours. HbA1C: No results for input(s): HGBA1C in the last 72 hours. CBG: Recent Labs  Lab 01/20/21 2000  GLUCAP 262*   Lipid Profile: No results for input(s): CHOL, HDL, LDLCALC, TRIG, CHOLHDL, LDLDIRECT in the last 72 hours. Thyroid Function Tests: No results for input(s): TSH, T4TOTAL, FREET4, T3FREE, THYROIDAB in the last 72 hours. Anemia Panel: No results for input(s): VITAMINB12, FOLATE, FERRITIN, TIBC, IRON, RETICCTPCT in the last 72 hours. Urine analysis:    Component Value Date/Time   COLORURINE YELLOW 03/23/2017 0045   APPEARANCEUR HAZY (A) 03/23/2017 0045   LABSPEC 1.010 09/02/2017 1328   PHURINE 6.0 09/02/2017 1328   GLUCOSEU NEGATIVE 09/02/2017 1328   HGBUR NEGATIVE 09/02/2017 1328   [place] 09/02/2017 [place] 09/02/2017 1328   PROTEINUR NEGATIVE 09/02/2017 1328   UROBILINOGEN 1.0 09/02/2017 1328   NITRITE NEGATIVE 09/02/2017 1328   LEUKOCYTESUR SMALL (A) 09/02/2017 1328    Radiological Exams on Admission: DG Chest 2 View  Result Date: 01/20/2021 CLINICAL DATA:  Possible sepsis EXAM: CHEST - 2 VIEW COMPARISON:  04/04/2016 FINDINGS: The heart size and mediastinal contours are within normal limits. Both lungs are clear. The visualized skeletal structures are unremarkable. IMPRESSION: No active cardiopulmonary disease. Electronically Signed   By: Inez Catalina M.D.   On: 01/20/2021 18:50   DG Foot Complete Right  Result Date: 01/20/2021 CLINICAL DATA:  Right foot pain and swelling for 2 days.  No injury. EXAM: RIGHT FOOT COMPLETE - 3+ VIEW COMPARISON:  None. FINDINGS: No fracture or bone lesion. Joints are normally spaced and aligned.  No  arthropathic changes. Soft tissue swelling most evident over the dorsal forefoot. No soft tissue air or radiopaque foreign body. IMPRESSION: 1. No fracture, bone lesion or joint abnormality. 2. Forefoot soft tissue swelling. Electronically Signed   By: Lajean Manes M.D.   On: 01/20/2021 18:51     EKG: Independently reviewed, with result as described above.    Assessment/Plan   Dashae Wilcher is a 45 y.o. female with medical history significant for type 2 diabetes mellitus, who is admitted to [facility] on 01/20/2021 with sepsis due to right lower extremity cellulitis after presenting from home to [facility] ED complaining of right foot pain.    Principal Problem:   Cellulitis of right lower extremity Active Problems:   Right foot pain   Hyperglycemia   Diabetes mellitus  without complication ([place])   Sepsis ([place])     #) Sepsis due to severe cellulitis of the right foot: dx on basis of 2 days of progressive erythema, tenderness, increased warmth, and swelling associated with the dorsal surface of the left foot, most prominently over the lateral aspect, with less prominent erythema and swelling associated with a small wound on the lateral aspect of the plantar surface of the right foot, potentially representing the portal of entry for suspected right foot cellulitis in the absence of any preceding injury.  This is in the context of lymph nodes of the right showing evidence of soft tissue swelling consistent with cellulitis without evidence of subcutaneous gas.  In the absence of subcutaneous gas on imaging in the absence of crepitus on physical exam, necrotizing fasciitis appears to be less likely at this time.  Additionally, plain films of the right foot demonstrated no evidence of bony demineralization to suggest osteomyelitis at this time.  However, in the setting of dorsal surface cellulitis with a wound evident on the plantar surface of the right foot, will further evaluate for underlying  osteomyelitis, with initial ESR found to be elevated at 50.  Patient's case and imaging was discussed by the EDP with the on-call orthopedic surgeon, Dr. Doreatha Martin, who will formally consult and plans to evaluate the patient in the morning, requesting that the patient be kept n.p.o. in the meantime.  Of note, right lower extremity appears neurovascularly intact.  Patient's risk factors for development of right foot cellulitis include suspected poorly controlled diabetes, as further detailed below, with potential peripheral neuropathy, as the patient was unaware of the presence of a small wound on the plantar aspect of her right foot.  SIRS criteria met via presenting tachycardia and leukocytosis.  Her sepsis does not meet criteria to be considered severe nature in the absence of concomitant evidence of endorgan damage, including nonelevated lactic acid level.  Additionally, presentation has not been associated with any evidence of hypotension.  In the setting of sepsis, the patient's cellulitis is considered severe in nature, warranting broad-spectrum antibiotics in the form of IV vancomycin plus a cephalosporin per recommendation set forth by our antibiotic stewardship committee.  Blood cultures x2 were collected in the ED this evening prior to initiation of IV vancomycin and Rocephin.  No additional source of underlying infectious process at this time, including chest x-ray, which was associate with no acute cardiopulmonary process, while COVID-19/influenza PCR performed in the ED today were found to be negative.  Urinalysis remains pending at this time.     Plan: Abx in form of IV vancomycin and Rocephin, as further detailed above, as above. Follow for results of blood cultures x2 collected today. repeat CBC with diff in AM. I have placed nursing communication orders asking that current distribution of erythema be outlined, and that affected extremity be elevated to help decrease associated swelling.  On-call  orthopedic surgeon consulted by EDP, as further described above, with Dr. Doreatha Martin plan to evaluate the patient in the morning.  n.p.o.  Check MRI of the right foot to further evaluate for underlying osteomyelitis.  Follow-up result of CRP.  As needed IV fentanyl.  Follow-up result urinalysis.       #) Hyperglycemia: Presenting blood sugar noted to be 385, in the absence of AGMA.  Therefore, criteria not met for DKA at this time.  Blood sugar has trended down to 262 following interval IV fluids. this is in the context of a reported diagnosis of diabetes,  for which the patient reports that she has been completely off all insulin, oral hypoglycemic medications, and other diabetic pharmacologic interventions over the course of the last 3 years, as further detailed above.  Prior to that, she  reports that she was previously on 15 units of regular insulin per day and metformin.  While there is likely a hypoglycemic component due to physiologic stress stemming from presenting sepsis due to right lower extremity cellulitis, I suspect that the patient's glycemic management has been suboptimal in the absence of any interval pharmacologic management as an outpatient.  We will check hemoglobin A1c to further assess.  Patient reports that she is amenable to resuming outpatient pharmacologic management, and is also amenable to meeting with our diabetic educator.   Plan: Lactated Ringer's at 150 cc/h.  Check hemoglobin A1c.  Every 6 hour Accu-Cheks associate with low-dose sliding scale insulin.  Consult to diabetic educator placed.  Repeat BMP in the morning.      #) Right foot pain: 2 days of progressive right foot pain over the dorsal aspect of his foot, in the setting of clinical and radiographic evidence of cellulitis, with further evaluation for potential underlying osteomyeltis pending, as further detailed above.  Plain films of the right foot demonstrate no evidence of fracture or foreign body.  Additionally,  presentation, physical exam, and radiographic findings are also suggestive of necrotizing fasciitis, as further detailed above.  Plan: Further evaluation and management of sepsis due to right foot cellulitis, including continuation of IV antibiotics, as further described above.  MRI of the right foot to further evaluate for underlying cellulitis.  Follow for result of CRP.  Orthopedic surgery consulted, as further detailed above.  As needed IV fentanyl.     DVT prophylaxis: SCDs Code Status: Full code Family Communication: none Disposition Plan: Per Rounding Team Consults called: case discussed with on-call orthopedic surgeon, Dr. Doreatha Martin, as further detailed above. Admission status: Inpatient; initially I placed admission order to med/tele, but was subsequently asked by the charge to revise this to PCU in the setting of the patient's persistent tachycardia, with heart rates of 110-120.  She is now PCU status.     Of note, this patient was added by me to the following Admit List/Treatment Team: [email or web address].      PLEASE NOTE THAT DRAGON DICTATION SOFTWARE WAS USED IN THE CONSTRUCTION OF THIS NOTE.   [place] [facility] Pager [phone] From [place]  Otherwise, please contact night-coverage  [email or web address] Password [facility]   01/20/2021, 9:16 PM

## 2021-01-21 ENCOUNTER — Inpatient Hospital Stay (HOSPITAL_COMMUNITY): Payer: Medicaid Other

## 2021-01-21 DIAGNOSIS — M79671 Pain in right foot: Secondary | ICD-10-CM | POA: Diagnosis present

## 2021-01-21 DIAGNOSIS — R739 Hyperglycemia, unspecified: Secondary | ICD-10-CM | POA: Diagnosis present

## 2021-01-21 DIAGNOSIS — A419 Sepsis, unspecified organism: Secondary | ICD-10-CM | POA: Diagnosis present

## 2021-01-21 DIAGNOSIS — E119 Type 2 diabetes mellitus without complications: Secondary | ICD-10-CM

## 2021-01-21 LAB — GLUCOSE, CAPILLARY
Glucose-Capillary: 256 mg/dL — ABNORMAL HIGH (ref 70–99)
Glucose-Capillary: 249 mg/dL — ABNORMAL HIGH (ref 70–99)
Glucose-Capillary: 296 mg/dL — ABNORMAL HIGH (ref 70–99)
Glucose-Capillary: 274 mg/dL — ABNORMAL HIGH (ref 70–99)

## 2021-01-21 LAB — CULTURE, BLOOD (ROUTINE X 2): Culture: NO GROWTH

## 2021-01-21 LAB — COMPREHENSIVE METABOLIC PANEL
ALT: 13 U/L (ref 0–44)
AST: 10 U/L — ABNORMAL LOW (ref 15–41)
Albumin: 2.8 g/dL — ABNORMAL LOW (ref 3.5–5.0)
Alkaline Phosphatase: 134 U/L — ABNORMAL HIGH (ref 38–126)
Anion gap: 11 (ref 5–15)
BUN: 9 mg/dL (ref 6–20)
CO2: 17 mmol/L — ABNORMAL LOW (ref 22–32)
Calcium: 8 mg/dL — ABNORMAL LOW (ref 8.9–10.3)
Chloride: 103 mmol/L (ref 98–111)
Creatinine, Ser: 0.63 mg/dL (ref 0.44–1.00)
GFR, Estimated: >60 mL/min (ref >60)
Glucose, Bld: 236 mg/dL — ABNORMAL HIGH (ref 70–99)
Potassium: 3.2 mmol/L — ABNORMAL LOW (ref 3.5–5.1)
Sodium: 131 mmol/L — ABNORMAL LOW (ref 135–145)
Total Bilirubin: 0.5 mg/dL (ref 0.3–1.2)
Total Protein: 5.7 g/dL — ABNORMAL LOW (ref 6.5–8.1)

## 2021-01-21 LAB — HIV ANTIBODY (ROUTINE TESTING W REFLEX): HIV Screen 4th Generation wRfx: NONREACTIVE

## 2021-01-21 LAB — MAGNESIUM: Magnesium: 1.8 mg/dL (ref 1.7–2.4)

## 2021-01-21 LAB — PROTIME-INR
INR: 1.1 (ref 0.8–1.2)
Prothrombin Time: 14.1 seconds (ref 11.4–15.2)

## 2021-01-21 LAB — C-REACTIVE PROTEIN: CRP: 8.9 mg/dL — ABNORMAL HIGH (ref <1.0)

## 2021-01-21 MED ORDER — INSULIN GLARGINE 100 UNIT/ML ~~LOC~~ SOLN
10.0000 [IU] | Freq: Every day | SUBCUTANEOUS | Status: DC
  Administered 2021-01-21: 10 [IU] via SUBCUTANEOUS
  Filled 2021-01-21 (×2): qty 0.1

## 2021-01-21 MED ORDER — ENOXAPARIN SODIUM 40 MG/0.4ML IJ SOSY
40.0000 mg | PREFILLED_SYRINGE | INTRAMUSCULAR | Status: DC
  Administered 2021-01-21: 40 mg via SUBCUTANEOUS
  Filled 2021-01-21: qty 0.4

## 2021-01-21 NOTE — Progress Notes (Signed)
Inpatient Diabetes Program Recommendations  AACE/ADA: New Consensus Statement on Inpatient Glycemic Control (2015)  Target Ranges:  Prepandial:   less than 140 mg/dL      Peak postprandial:   less than 180 mg/dL (1-2 hours)      Critically ill patients:  140 - 180 mg/dL   Lab Results  Component Value Date   GLUCAP 256 (H) 01/21/2021   HGBA1C 8.3 (H) 05/07/2017    Review of Glycemic Control Results for MOUNA, YAGER (MRN [id number]) as of 01/21/2021 12:57  Ref. Range 01/20/2021 20:00 01/20/2021 22:42 01/21/2021 06:44  Glucose-Capillary Latest Ref Range: 70 - 99 mg/dL 503 (H) 888 (H) 280 (H)   Diabetes history: DM 2- Outpatient Diabetes medications: none- off insulin for 3 years Current orders for Inpatient glycemic control:  Novolog very sensitive q 6 hours  Inpatient Diabetes Program Recommendations:    Consider adding Lantus 10 units daily.  Referral received.  Will have onsite diabetes coordinator see patient on 01/22/21.   Thanks,  Beryl Meager, RN, BC-ADM Inpatient Diabetes Coordinator Pager [phone] (8a-5p)

## 2021-01-21 NOTE — Consult Note (Signed)
Orthopaedic Trauma Service (OTS) Consult   Patient ID: Michele Morales MRN: [id number] DOB/AGE: Nov 26, 1975 45 y.o.  Reason for Consult: Cellulitis right foot Referring Physician: Lyndel Safe, PA-C [facility] emergency department)  HPI: Michele Morales is an 45 y.o. female being seen in consultation at the request of PA Jeraldine Loots for cellulitis of the right foot.  Patient noticed swelling and redness over the lateral aspect of her right foot starting on Friday.  The pain continued to worsen over the weekend and yesterday she is unable to bear any weight.  She seen in [facility] emergency department for what appeared to be cellulitis to the right foot.  Had associated tachycardia and subjective fevers.  Orthopedics was consulted for evaluation and management.  Patient admitted to medicine service and started on IV antibiotics overnight.  Patient seen this morning on 4 E.  Pain is much improved from yesterday.  Notes that the area of redness is also improving.  She is able to move her foot a little bit more.  Patient does have a history of diabetes, is not currently medicated.  Hemoglobin A1c is unknown.   Past Medical History:  Diagnosis Date  . Diabetes mellitus without complication (HCC)    IDDM  . Hypercholesteremia     Past Surgical History:  Procedure Laterality Date  . NO PAST SURGERIES      Family History  Problem Relation Age of Onset  . Diabetes Mother     Social History:  reports that she has never smoked. She has never used smokeless tobacco. She reports that she does not drink alcohol and does not use drugs.  Allergies: No Known Allergies  Medications:  I have reviewed the patient's current medications. Prior to Admission:  Medications Prior to Admission  Medication Sig Dispense Refill Last Dose  . aspirin EC 81 MG tablet Take 1 tablet (81 mg total) by mouth daily. (Patient taking differently: Take 81 mg by mouth every 6 (six) hours as needed for mild pain.) 30  tablet 6 unk  . ibuprofen (ADVIL,MOTRIN) 600 MG tablet Take 1 tablet (600 mg total) by mouth every 6 (six) hours. (Patient not taking: Reported on 01/20/2021) 60 tablet 0 Not Taking at Unknown time  . metFORMIN (GLUCOPHAGE) 1000 MG tablet Take 1 tablet (1,000 mg total) by mouth 2 (two) times daily with a meal. (Patient not taking: Reported on 01/20/2021) 60 tablet 0 Not Taking at Unknown time    ROS: Constitutional: No fever or chills Vision: No changes in vision ENT: No difficulty swallowing CV: No chest pain Pulm: No SOB or wheezing GI: No nausea or vomiting GU: No urgency or inability to hold urine Skin: + Redness right foot Neurologic: No numbness or tingling Psychiatric: No depression or anxiety Heme: No bruising Allergic: No reaction to medications or food   Exam: Blood pressure 126/67, pulse 99, temperature 98.6 F (37 C), temperature source Oral, resp. rate 17, weight 61.4 kg, last menstrual period 01/18/2021, SpO2 100 %, unknown if currently breastfeeding. General: Sitting up in bed, no acute distress Orientation: Alert and oriented x3 Mood and Affect: Mood and affect appropriate, pleasant and cooperative Gait: Not assessed this a.m. Coordination and balance: Within normal limits  RLE: Area of redness noted to the dorsal aspect of the foot is much smaller than the previously outlined area of induration.  Does have tenderness with palpation of this area but notes improvement from yesterday.  Patient is able to wiggle toes.  Motor and sensory function is intact she  is neurovascularly intact  LLE: Skin without lesions. No tenderness to palpation. Full painless ROM, full strength in each muscle groups without evidence of instability.   Medical Decision Making: Data: Imaging: MRI right foot showed no osteomyelitis.  There was some soft tissue edema but no drainable fluid collection  Labs:  Results for orders placed or performed during the hospital encounter of 01/20/21 (from  the past 24 hour(s))  Basic metabolic panel     Status: Abnormal   Collection Time: 01/20/21  4:04 PM  Result Value Ref Range   Sodium 130 (L) 135 - 145 mmol/L   Potassium 3.9 3.5 - 5.1 mmol/L   Chloride 99 98 - 111 mmol/L   CO2 18 (L) 22 - 32 mmol/L   Glucose, Bld 385 (H) 70 - 99 mg/dL   BUN 13 6 - 20 mg/dL   Creatinine, Ser 7.42 0.44 - 1.00 mg/dL   Calcium 8.2 (L) 8.9 - 10.3 mg/dL   GFR, Estimated >59 >56 mL/min   Anion gap 13 5 - 15  CBC with Differential     Status: Abnormal   Collection Time: 01/20/21  4:04 PM  Result Value Ref Range   WBC 21.6 (H) 4.0 - 10.5 K/uL   RBC 4.05 3.87 - 5.11 MIL/uL   Hemoglobin 11.4 (L) 12.0 - 15.0 g/dL   HCT 38.7 (L) 56.4 - 33.2 %   MCV 87.4 80.0 - 100.0 fL   MCH 28.1 26.0 - 34.0 pg   MCHC 32.2 30.0 - 36.0 g/dL   RDW 95.1 88.4 - 16.6 %   Platelets 394 150 - 400 K/uL   nRBC 0.0 0.0 - 0.2 %   Neutrophils Relative % 85 %   Neutro Abs 18.7 (H) 1.7 - 7.7 K/uL   Lymphocytes Relative 7 %   Lymphs Abs 1.4 0.7 - 4.0 K/uL   Monocytes Relative 6 %   Monocytes Absolute 1.2 (H) 0.1 - 1.0 K/uL   Eosinophils Relative 1 %   Eosinophils Absolute 0.1 0.0 - 0.5 K/uL   Basophils Relative 0 %   Basophils Absolute 0.1 0.0 - 0.1 K/uL   Immature Granulocytes 1 %   Abs Immature Granulocytes 0.13 (H) 0.00 - 0.07 K/uL  hCG, quantitative, pregnancy     Status: None   Collection Time: 01/20/21  4:04 PM  Result Value Ref Range   hCG, Beta Chain, Quant, S 4 <5 mIU/mL  Lactic acid, plasma     Status: None   Collection Time: 01/20/21  4:44 PM  Result Value Ref Range   Lactic Acid, Venous 1.3 0.5 - 1.9 mmol/L  Culture, blood (routine x 2)     Status: None (Preliminary result)   Collection Time: 01/20/21  4:44 PM   Specimen: Right Antecubital; Blood  Result Value Ref Range   Specimen Description RIGHT ANTECUBITAL    Special Requests      BOTTLES DRAWN AEROBIC AND ANAEROBIC Blood Culture adequate volume   Culture      NO GROWTH < 12 HOURS Performed at [facility], [address], [place], [place] [address]    Report Status PENDING   Culture, blood (routine x 2)     Status: None (Preliminary result)   Collection Time: 01/20/21  5:45 PM   Specimen: BLOOD LEFT FOREARM  Result Value Ref Range   Specimen Description BLOOD LEFT FOREARM    Special Requests      BOTTLES DRAWN AEROBIC AND ANAEROBIC Blood Culture adequate volume   Culture  NO GROWTH < 12 HOURS Performed at [facility], [address] [place], [place] [address]    Report Status PENDING   Resp Panel by RT-PCR (Flu A&B, Covid) Nasopharyngeal Swab     Status: None   Collection Time: 01/20/21  5:45 PM   Specimen: Nasopharyngeal Swab; Nasopharyngeal(NP) swabs in vial transport medium  Result Value Ref Range   SARS Coronavirus 2 by RT PCR NEGATIVE NEGATIVE   Influenza A by PCR NEGATIVE NEGATIVE   Influenza B by PCR NEGATIVE NEGATIVE  Sedimentation rate     Status: Abnormal   Collection Time: 01/20/21  5:45 PM  Result Value Ref Range   Sed Rate 50 (H) 0 - 22 mm/hr  Lactic acid, plasma     Status: None   Collection Time: 01/20/21  6:26 PM  Result Value Ref Range   Lactic Acid, Venous 1.2 0.5 - 1.9 mmol/L  CBG monitoring, ED     Status: Abnormal   Collection Time: 01/20/21  8:00 PM  Result Value Ref Range   Glucose-Capillary 262 (H) 70 - 99 mg/dL  Glucose, capillary     Status: Abnormal   Collection Time: 01/20/21 10:42 PM  Result Value Ref Range   Glucose-Capillary 242 (H) 70 - 99 mg/dL  C-reactive protein     Status: Abnormal   Collection Time: 01/20/21 11:33 PM  Result Value Ref Range   CRP 8.9 (H) <1.0 mg/dL  Magnesium     Status: None   Collection Time: 01/20/21 11:33 PM  Result Value Ref Range   Magnesium 1.8 1.7 - 2.4 mg/dL  Comprehensive metabolic panel     Status: Abnormal   Collection Time: 01/20/21 11:33 PM  Result Value Ref Range   Sodium 131 (L) 135 - 145 mmol/L   Potassium 3.2 (L) 3.5 - 5.1 mmol/L   Chloride 103 98 - 111 mmol/L   CO2 17 (L) 22  - 32 mmol/L   Glucose, Bld 236 (H) 70 - 99 mg/dL   BUN 9 6 - 20 mg/dL   Creatinine, Ser 4.780.63 0.44 - 1.00 mg/dL   Calcium 8.0 (L) 8.9 - 10.3 mg/dL   Total Protein 5.7 (L) 6.5 - 8.1 g/dL   Albumin 2.8 (L) 3.5 - 5.0 g/dL   AST 10 (L) 15 - 41 U/L   ALT 13 0 - 44 U/L   Alkaline Phosphatase 134 (H) 38 - 126 U/L   Total Bilirubin 0.5 0.3 - 1.2 mg/dL   GFR, Estimated >29>60 >56>60 mL/min   Anion gap 11 5 - 15  CBC with Differential/Platelet     Status: Abnormal   Collection Time: 01/20/21 11:33 PM  Result Value Ref Range   WBC 18.6 (H) 4.0 - 10.5 K/uL   RBC 3.65 (L) 3.87 - 5.11 MIL/uL   Hemoglobin 10.5 (L) 12.0 - 15.0 g/dL   HCT 21.331.3 (L) 08.636.0 - 57.846.0 %   MCV 85.8 80.0 - 100.0 fL   MCH 28.8 26.0 - 34.0 pg   MCHC 33.5 30.0 - 36.0 g/dL   RDW 46.914.6 62.911.5 - 52.815.5 %   Platelets 379 150 - 400 K/uL   nRBC 0.0 0.0 - 0.2 %   Neutrophils Relative % 80 %   Neutro Abs 15.0 (H) 1.7 - 7.7 K/uL   Lymphocytes Relative 10 %   Lymphs Abs 1.9 0.7 - 4.0 K/uL   Monocytes Relative 8 %   Monocytes Absolute 1.4 (H) 0.1 - 1.0 K/uL   Eosinophils Relative 1 %  Eosinophils Absolute 0.2 0.0 - 0.5 K/uL   Basophils Relative 0 %   Basophils Absolute 0.1 0.0 - 0.1 K/uL   Immature Granulocytes 1 %   Abs Immature Granulocytes 0.09 (H) 0.00 - 0.07 K/uL  Protime-INR     Status: None   Collection Time: 01/21/21 12:35 AM  Result Value Ref Range   Prothrombin Time 14.1 11.4 - 15.2 seconds   INR 1.1 0.8 - 1.2  Glucose, capillary     Status: Abnormal   Collection Time: 01/21/21  6:44 AM  Result Value Ref Range   Glucose-Capillary 256 (H) 70 - 99 mg/dL     Medical history and chart was reviewed and case discussed with medical provider.  Assessment/Plan: 45 year old female with PMH significant for type 2 diabetes presenting with cellulitis to right foot.  Physical exam today shows improvement of redness and swelling noted on clinical images from yesterday.  Pain is improving.  There is no drainable collection seen on MRI  of the foot. No need for formal irrigation debridement of the area.  Recommend continue with IV antibiotics and care per medicine team.  We will outpatient a diet today.  We will have her work on mobility with physical therapy.  We will plan to recheck foot tomorrow to ensure she continues to have improvement.  I have discussed this with medicine team.  Patient is agreement with the plan.   Tomoya Ringwald A. Ladonna Snide [facility] [phone] (office) [email or web address]

## 2021-01-21 NOTE — Progress Notes (Signed)
PROGRESS NOTE    Michele Morales  NOB:[id number] DOB: 1976-05-20 DOA: 01/20/2021 PCP: Patient, No Pcp Per (Inactive)   Brief Narrative: Michele Morales is a 45 y.o. female with a history of diabetes mellitus, type 2. Patient presented secondary to right foot pain and found to have evidence of cellulitis with initial concern for possible osteomyelitis. Orthopedic surgery consulted. Empiric antibiotics initiated.    Assessment & Plan:   Principal Problem:   Cellulitis of right lower extremity Active Problems:   Right foot pain   Hyperglycemia   Diabetes mellitus without complication (HCC)   Sepsis (HCC)   Sepsis Present on admission. Secondary to cellulitis. Started empirically on Vancomycin and Ceftriaxone IV. Blood cultures obtained on admission. -Follow-up blood cultures -Continue Vancomycin and Ceftriaxone IV  Cellulitis of right foot Secondary to foot wound in setting of likely poorly controlled diabetes. Foot x-ray and MRI significant for no evidence of osteomyelitis. Orthopedic surgery was consulted on admission. Antibiotics as mentioned above. -Antibiotics as above -Orthopedic surgery recommendations: no plans for surgery at this time  Diabetes mellitus, type 2 Poorly controlled. Last hemoglobin A1C of 8.3% in 2018. Patient with history that is consistent with neuropathy of feet. -Lantus 10 units daily -Follow-up hemoglobin A1C -TOC for PCP and medication assistance -Dietitian and diabetes coordinator consults  Hyponatremia Likely pseudohyponatremia in setting of hyperglycemia.   DVT prophylaxis: Lovenox Code Status:   Code Status: Full Code Family Communication: None at bedside Disposition Plan: Discharge home likely in 1-2 days pending culture results, transition to oral antibiotics   Consultants:   Orthopedic surgery  Procedures:   None  Antimicrobials:  Vancomycin  Ceftriaxone    Subjective: Foot pain/erythema/swelling improved. No concerns  per patient. Currently does not have insurance and does not have a PCP. She is not taking any medications for her diabetes. She reports numbness of both her feet.  Objective: Vitals:   01/21/21 0200 01/21/21 0645 01/21/21 0726 01/21/21 1131  BP: 130/79 121/71 126/67 125/69  Pulse:  100 99 95  Resp:  15 17 14   Temp: 98.1 F (36.7 C) 98.9 F (37.2 C) 98.6 F (37 C) 98.2 F (36.8 C)  TempSrc: Oral Oral Oral Oral  SpO2:  99% 100% 99%  Weight:        Intake/Output Summary (Last 24 hours) at 01/21/2021 1435 Last data filed at 01/21/2021 0344 Gross per 24 hour  Intake 1766.23 ml  Output --  Net 1766.23 ml   Filed Weights   01/20/21 2200  Weight: 61.4 kg    Examination:  General exam: Appears calm and comfortable Respiratory system: Clear to auscultation. Respiratory effort normal. Cardiovascular system: S1 & S2 heard, RRR. No murmurs, rubs, gallops or clicks. Gastrointestinal system: Abdomen is nondistended, soft and nontender. No organomegaly or masses felt. Normal bowel sounds heard. Central nervous system: Alert and oriented. No focal neurological deficits. Musculoskeletal: Edema of right foot. No calf tenderness Skin: No cyanosis. Dorsum of right foot with overlying erythema; lateral plantar surface with ulcer and no eminating purulence Psychiatry: Judgement and insight appear normal. Mood & affect appropriate.     Data Reviewed: I have personally reviewed following labs and imaging studies  CBC Lab Results  Component Value Date   WBC 18.6 (H) 01/20/2021   RBC 3.65 (L) 01/20/2021   HGB 10.5 (L) 01/20/2021   HCT 31.3 (L) 01/20/2021   MCV 85.8 01/20/2021   MCH 28.8 01/20/2021   PLT 379 01/20/2021   MCHC 33.5 01/20/2021   RDW 14.6 01/20/2021  LYMPHSABS 1.9 01/20/2021   MONOABS 1.4 (H) 01/20/2021   EOSABS 0.2 01/20/2021   BASOSABS 0.1 01/20/2021     Last metabolic panel Lab Results  Component Value Date   NA 131 (L) 01/20/2021   K 3.2 (L) 01/20/2021   CL  103 01/20/2021   CO2 17 (L) 01/20/2021   BUN 9 01/20/2021   CREATININE 0.63 01/20/2021   GLUCOSE 236 (H) 01/20/2021   GFRNONAA >60 01/20/2021   GFRAA >60 09/09/2017   CALCIUM 8.0 (L) 01/20/2021   PROT 5.7 (L) 01/20/2021   ALBUMIN 2.8 (L) 01/20/2021   BILITOT 0.5 01/20/2021   ALKPHOS 134 (H) 01/20/2021   AST 10 (L) 01/20/2021   ALT 13 01/20/2021   ANIONGAP 11 01/20/2021    CBG (last 3)  Recent Labs    01/20/21 2242 01/21/21 0644 01/21/21 1331  GLUCAP 242* 256* 249*     GFR: CrCl cannot be calculated (Unknown ideal weight.).  Coagulation Profile: Recent Labs  Lab 01/21/21 0035  INR 1.1    Recent Results (from the past 240 hour(s))  Culture, blood (routine x 2)     Status: None (Preliminary result)   Collection Time: 01/20/21  4:44 PM   Specimen: Right Antecubital; Blood  Result Value Ref Range Status   Specimen Description RIGHT ANTECUBITAL  Final   Special Requests   Final    BOTTLES DRAWN AEROBIC AND ANAEROBIC Blood Culture adequate volume   Culture   Final    NO GROWTH < 12 HOURS Performed at [facility], [address], [place], [place] [address]    Report Status PENDING  Incomplete  Culture, blood (routine x 2)     Status: None (Preliminary result)   Collection Time: 01/20/21  5:45 PM   Specimen: BLOOD LEFT FOREARM  Result Value Ref Range Status   Specimen Description BLOOD LEFT FOREARM  Final   Special Requests   Final    BOTTLES DRAWN AEROBIC AND ANAEROBIC Blood Culture adequate volume   Culture   Final    NO GROWTH < 12 HOURS Performed at [facility], [address] [place], [place] [address]    Report Status PENDING  Incomplete  Resp Panel by RT-PCR (Flu A&B, Covid) Nasopharyngeal Swab     Status: None   Collection Time: 01/20/21  5:45 PM   Specimen: Nasopharyngeal Swab; Nasopharyngeal(NP) swabs in vial transport medium  Result Value Ref Range Status   SARS Coronavirus 2 by RT PCR NEGATIVE NEGATIVE Final    Comment:  (NOTE) SARS-CoV-2 target nucleic acids are NOT DETECTED.  The SARS-CoV-2 RNA is generally detectable in upper respiratory specimens during the acute phase of infection. The lowest concentration of SARS-CoV-2 viral copies this assay can detect is 138 copies/mL. A negative result does not preclude SARS-Cov-2 infection and should not be used as the sole basis for treatment or other patient management decisions. A negative result may occur with  improper specimen collection/handling, submission of specimen other than nasopharyngeal swab, presence of viral mutation(s) within the areas targeted by this assay, and inadequate number of viral copies(<138 copies/mL). A negative result must be combined with clinical observations, patient history, and epidemiological information. The expected result is Negative.  Fact Sheet for Patients:  BloggerCourse.com  Fact Sheet for Healthcare Providers:  SeriousBroker.it  This test is no t yet approved or cleared by the [place] FDA and  has been authorized for detection and/or diagnosis of SARS-CoV-2 by FDA under an Emergency Use Authorization (EUA).  This EUA will remain  in effect (meaning this test can be used) for the duration of the COVID-19 declaration under Section 564(b)(1) of the Act, 21 U.S.C.section 360bbb-3(b)(1), unless the authorization is terminated  or revoked sooner.       Influenza A by PCR NEGATIVE NEGATIVE Final   Influenza B by PCR NEGATIVE NEGATIVE Final    Comment: (NOTE) The Xpert Xpress SARS-CoV-2/FLU/RSV plus assay is intended as an aid in the diagnosis of influenza from Nasopharyngeal swab specimens and should not be used as a sole basis for treatment. Nasal washings and aspirates are unacceptable for Xpert Xpress SARS-CoV-2/FLU/RSV testing.  Fact Sheet for Patients: BloggerCourse.comhttps://www.fda.gov/media/152166/download  Fact Sheet for Healthcare  Providers: SeriousBroker.ithttps://www.fda.gov/media/152162/download  This test is not yet approved or cleared by the [place] FDA and has been authorized for detection and/or diagnosis of SARS-CoV-2 by FDA under an Emergency Use Authorization (EUA). This EUA will remain in effect (meaning this test can be used) for the duration of the COVID-19 declaration under Section 564(b)(1) of the Act, 21 U.S.C. section 360bbb-3(b)(1), unless the authorization is terminated or revoked.  Performed at [facility], [address] [place], [place] [address]         Radiology Studies: DG Chest 2 View  Result Date: 01/20/2021 CLINICAL DATA:  Possible sepsis EXAM: CHEST - 2 VIEW COMPARISON:  04/04/2016 FINDINGS: The heart size and mediastinal contours are within normal limits. Both lungs are clear. The visualized skeletal structures are unremarkable. IMPRESSION: No active cardiopulmonary disease. Electronically Signed   By: Alcide CleverMark  Lukens M.D.   On: 01/20/2021 18:50   MR FOOT RIGHT WO CONTRAST  Result Date: 01/21/2021 CLINICAL DATA:  Type 2 diabetes. Right lower extremity cellulitis. No cyst seated drainage. No prior trauma. EXAM: MRI OF THE RIGHT FOREFOOT WITHOUT CONTRAST TECHNIQUE: Multiplanar, multisequence MR imaging of the right forefoot was performed. No intravenous contrast was administered. COMPARISON:  None. FINDINGS: Bones/Joint/Cartilage No marrow signal abnormality. No fracture or dislocation. Normal alignment. No joint effusion. No periosteal reaction or bone destruction. Ligaments Collateral ligaments are intact.  Lisfranc ligament is intact. Muscles and Tendons Flexor, peroneal and extensor compartment tendons are intact. Muscles are normal. Soft tissue No fluid collection or hematoma. No soft tissue mass. Soft tissue edema in the subcutaneous fat along the dorsal aspect of the foot which may be reactive edema versus cellulitis. IMPRESSION: 1. No osteomyelitis of the right forefoot. 2. Soft tissue  edema in the subcutaneous fat along the dorsal aspect of the foot which may be reactive edema versus cellulitis. No drainable fluid collection to suggest an abscess. Electronically Signed   By: Elige KoHetal  Patel   On: 01/21/2021 09:11   DG Foot Complete Right  Result Date: 01/20/2021 CLINICAL DATA:  Right foot pain and swelling for 2 days.  No injury. EXAM: RIGHT FOOT COMPLETE - 3+ VIEW COMPARISON:  None. FINDINGS: No fracture or bone lesion. Joints are normally spaced and aligned.  No arthropathic changes. Soft tissue swelling most evident over the dorsal forefoot. No soft tissue air or radiopaque foreign body. IMPRESSION: 1. No fracture, bone lesion or joint abnormality. 2. Forefoot soft tissue swelling. Electronically Signed   By: Amie Portlandavid  Ormond M.D.   On: 01/20/2021 18:51        Scheduled Meds: . insulin aspart  0-6 Units Subcutaneous Q6H   Continuous Infusions: . cefTRIAXone (ROCEPHIN)  IV    . vancomycin       LOS: 1 day     Jacquelin Hawkingalph Cherylann Hobday, MD  [facility] 01/21/2021, 2:35 PM  If 7PM-7AM, please contact night-coverage [email or web address]

## 2021-01-22 ENCOUNTER — Other Ambulatory Visit (HOSPITAL_COMMUNITY): Payer: Self-pay

## 2021-01-22 LAB — BASIC METABOLIC PANEL
Anion gap: 10 (ref 5–15)
BUN: 7 mg/dL (ref 6–20)
CO2: 17 mmol/L — ABNORMAL LOW (ref 22–32)
Calcium: 8.2 mg/dL — ABNORMAL LOW (ref 8.9–10.3)
Chloride: 105 mmol/L (ref 98–111)
Creatinine, Ser: 0.5 mg/dL (ref 0.44–1.00)
GFR, Estimated: >60 mL/min (ref >60)
Glucose, Bld: 229 mg/dL — ABNORMAL HIGH (ref 70–99)
Potassium: 3.6 mmol/L (ref 3.5–5.1)
Sodium: 132 mmol/L — ABNORMAL LOW (ref 135–145)

## 2021-01-22 LAB — CBC
HCT: 31 % — ABNORMAL LOW (ref 36.0–46.0)
Hemoglobin: 10.4 g/dL — ABNORMAL LOW (ref 12.0–15.0)
MCH: 28.7 pg (ref 26.0–34.0)
MCHC: 33.5 g/dL (ref 30.0–36.0)
MCV: 85.4 fL (ref 80.0–100.0)
Platelets: 382 10*3/uL (ref 150–400)
RBC: 3.63 MIL/uL — ABNORMAL LOW (ref 3.87–5.11)
RDW: 14.6 % (ref 11.5–15.5)
WBC: 16.6 10*3/uL — ABNORMAL HIGH (ref 4.0–10.5)
nRBC: 0 % (ref 0.0–0.2)

## 2021-01-22 LAB — HEMOGLOBIN A1C
Hgb A1c MFr Bld: 13.6 % — ABNORMAL HIGH (ref 4.8–5.6)
Hgb A1c MFr Bld: 13.6 % — ABNORMAL HIGH (ref 4.8–5.6)
Mean Plasma Glucose: 344 mg/dL
Mean Plasma Glucose: 344 mg/dL

## 2021-01-22 LAB — GLUCOSE, CAPILLARY
Glucose-Capillary: 250 mg/dL — ABNORMAL HIGH (ref 70–99)
Glucose-Capillary: 315 mg/dL — ABNORMAL HIGH (ref 70–99)
Glucose-Capillary: 324 mg/dL — ABNORMAL HIGH (ref 70–99)

## 2021-01-22 LAB — CULTURE, BLOOD (ROUTINE X 2): Special Requests: ADEQUATE

## 2021-01-22 MED ORDER — ACCU-CHEK SOFTCLIX LANCETS MISC
5 refills | Status: DC
  Filled 2021-01-22: qty 100, 30d supply, fill #0

## 2021-01-22 MED ORDER — ACCU-CHEK GUIDE VI STRP
ORAL_STRIP | 12 refills | Status: DC
  Filled 2021-01-22: qty 100, 30d supply, fill #0

## 2021-01-22 MED ORDER — CEFDINIR 300 MG PO CAPS
300.0000 mg | ORAL_CAPSULE | Freq: Two times a day (BID) | ORAL | 0 refills | Status: AC
  Filled 2021-01-22: qty 16, 8d supply, fill #0

## 2021-01-22 MED ORDER — PENTIPS 32G X 4 MM MISC
3 refills | Status: DC
  Filled 2021-01-22: qty 200, 30d supply, fill #0

## 2021-01-22 MED ORDER — INSULIN GLARGINE 100 UNIT/ML ~~LOC~~ SOLN
18.0000 [IU] | Freq: Every day | SUBCUTANEOUS | Status: DC
  Filled 2021-01-22: qty 0.18

## 2021-01-22 MED ORDER — CEFDINIR 300 MG PO CAPS
300.0000 mg | ORAL_CAPSULE | Freq: Two times a day (BID) | ORAL | Status: DC
  Administered 2021-01-22: 300 mg via ORAL
  Filled 2021-01-22 (×2): qty 1

## 2021-01-22 MED ORDER — BLOOD GLUCOSE METER KIT
PACK | 0 refills | Status: DC
  Filled 2021-01-22: qty 1, fill #0

## 2021-01-22 MED ORDER — NOVOLIN 70/30 FLEXPEN (70-30) 100 UNIT/ML ~~LOC~~ SUPN
10.0000 [IU] | PEN_INJECTOR | Freq: Two times a day (BID) | SUBCUTANEOUS | 0 refills | Status: DC
  Filled 2021-01-22: qty 6, 30d supply, fill #0

## 2021-01-22 MED ORDER — ACCU-CHEK GUIDE W/DEVICE KIT
PACK | 0 refills | Status: DC
  Filled 2021-01-22: qty 1, 1d supply, fill #0

## 2021-01-22 MED ORDER — METFORMIN HCL 850 MG PO TABS
850.0000 mg | ORAL_TABLET | Freq: Every day | ORAL | 2 refills | Status: DC
  Filled 2021-01-22: qty 30, 30d supply, fill #0

## 2021-01-22 NOTE — TOC Initial Note (Addendum)
Transition of Care [facility]) - Initial/Assessment Note    Patient Details  Name: Michele Morales MRN: [id number] Date of Birth: Oct 03, 1975  [facility]) CM/SW Contact:    Lawerance Sabal, RN Phone Number: 01/22/2021, 9:58 AM  Clinical Narrative:                Knute Neu w patient at bedside w [facility] interpreter. F/U apt will be made by CMA, requested [facility]. Meds for DC will be sent through [facility] pharmacy. Patient aware that she will be sent home with her medications, and should wait to go until she has them  MATCH entered in system.          Barriers to Discharge: No Barriers Identified   Patient Goals and CMS Choice        Expected Discharge Plan and Services                                                Prior Living Arrangements/Services                       Activities of Daily Living      Permission Sought/Granted                  Emotional Assessment              Admission diagnosis:  Hyperglycemia [R73.9] Cellulitis of right lower extremity [L03.115] Diabetic foot infection (HCC) [Q67.619, L08.9] Patient's noncompliance with other medical treatment and regimen [Z91.19] Sepsis without acute organ dysfunction, due to unspecified organism [facility]) [A41.9] Patient Active Problem List   Diagnosis Date Noted  . Right foot pain 01/21/2021  . Hyperglycemia 01/21/2021  . Diabetes mellitus without complication (HCC)   . Sepsis (HCC)   . Cellulitis of right lower extremity 01/20/2021  . Grand multipara 07/10/2017  . Language barrier 05/07/2017  . Diabetes mellitus affecting pregnancy, antepartum 04/23/2017   PCP:  Patient, No Pcp Per (Inactive) Pharmacy:   [facility] [address], [place] - [address] [place] [address] Phone: [phone] Fax: [phone]  [facility] [address] [place] [address] Phone: [phone] Fax:  [phone]     Social Determinants of Health (SDOH) Interventions    Readmission Risk Interventions No flowsheet data found.

## 2021-01-22 NOTE — Evaluation (Signed)
Physical Therapy Evaluation Patient Details Name: Michele Morales MRN: [id number] DOB: 02-Aug-1976 Today's Date: 01/22/2021   History of Present Illness  Michele Morales is an 45 y.o. female admitted on 5/29 for cellulitis of the right foot.  Treated with antibiotics.  PMH:  DM  Clinical Impression  Pt admitted with above diagnosis. Pt was able to ambulate without device with antalgic gait without physical assist today. Pt was limited by foot pain with weight bearing. Discussed RW use to take pressure off pts' foot  but pt declining at present.  Pt should progress well.   Pt currently with functional limitations due to the deficits listed below (see PT Problem List). Pt will benefit from skilled PT to increase their independence and safety with mobility to allow discharge to the venue listed below.      Follow Up Recommendations No PT follow up    Equipment Recommendations  None recommended by PT    Recommendations for Other Services       Precautions / Restrictions Precautions Precautions: Fall Restrictions Weight Bearing Restrictions: No      Mobility  Bed Mobility Overal bed mobility: Independent                  Transfers Overall transfer level: Independent                  Ambulation/Gait Ambulation/Gait assistance: Supervision Gait Distance (Feet): 110 Feet Assistive device: None Gait Pattern/deviations: Step-through pattern;Decreased stride length;Antalgic;Decreased weight shift to right;Decreased stance time - right        Careers information officer    Modified Rankin (Stroke Patients Only)       Balance Overall balance assessment: Needs assistance         Standing balance support: No upper extremity supported;During functional activity Standing balance-Leahy Scale: Fair Standing balance comment: No physical assist needed. Antalgic gait                             Pertinent Vitals/Pain Pain Assessment:  No/denies pain    Home Living Family/patient expects to be discharged to:: Private residence Living Arrangements: Spouse/significant other Available Help at Discharge: Available PRN/intermittently (spouse works) Type of Home: Mobile home Home Access: Stairs to enter Entrance Stairs-Rails: Right;Left;Can reach both Secretary/administrator of Steps: 10 Home Layout: One level Home Equipment: None      Prior Function Level of Independence: Independent               Hand Dominance        Extremity/Trunk Assessment   Upper Extremity Assessment Upper Extremity Assessment: Defer to OT evaluation    Lower Extremity Assessment Lower Extremity Assessment: Generalized weakness    Cervical / Trunk Assessment Cervical / Trunk Assessment: Normal  Communication   Communication: Prefers language other than English (Spanish, interpreter [place] [phone])  Cognition Arousal/Alertness: Awake/alert Behavior During Therapy: WFL for tasks assessed/performed Overall Cognitive Status: Within Functional Limits for tasks assessed                                        General Comments General comments (skin integrity, edema, etc.): right foot swollen and red    Exercises General Exercises - Lower Extremity Ankle Circles/Pumps: AROM;Both;10 reps;Supine Long Arc Quad: AROM;Both;10 reps;Seated   Assessment/Plan  PT Assessment Patient needs continued PT services  PT Problem List Decreased activity tolerance;Decreased mobility       PT Treatment Interventions DME instruction;Gait training;Stair training;Therapeutic exercise;Patient/family education    PT Goals (Current goals can be found in the Care Plan section)  Acute Rehab PT Goals Patient Stated Goal: to go home PT Goal Formulation: With patient Time For Goal Achievement: 02/04/21 Potential to Achieve Goals: Good    Frequency Min 3X/week   Barriers to discharge        Co-evaluation                AM-PAC PT "6 Clicks" Mobility  Outcome Measure Help needed turning from your back to your side while in a flat bed without using bedrails?: None Help needed moving from lying on your back to sitting on the side of a flat bed without using bedrails?: None Help needed moving to and from a bed to a chair (including a wheelchair)?: None Help needed standing up from a chair using your arms (e.g., wheelchair or bedside chair)?: None Help needed to walk in hospital room?: A Little Help needed climbing 3-5 steps with a railing? : A Little 6 Click Score: 22    End of Session Equipment Utilized During Treatment: Gait belt Activity Tolerance: Patient tolerated treatment well Patient left: in bed;with call bell/phone within reach;with family/visitor present Nurse Communication: Mobility status PT Visit Diagnosis: Muscle weakness (generalized) (M62.81)    Time: 9518-8416 PT Time Calculation (min) (ACUTE ONLY): 17 min   Charges:   PT Evaluation $PT Eval Low Complexity: 1 Low          Dekota Kirlin M,PT Acute Rehab Services [phone] (pager)  Bevelyn Buckles 01/22/2021, 11:18 AM

## 2021-01-22 NOTE — Plan of Care (Signed)

## 2021-01-22 NOTE — Discharge Summary (Signed)
Physician Discharge Summary  Meshia Rau YOM:[id number] DOB: 06-04-1976 DOA: 01/20/2021  PCP: Patient, No Pcp Per (Inactive)  Admit date: 01/20/2021 Discharge date: 01/22/2021  Admitted From: Home Disposition: Home  Recommendations for Outpatient Follow-up:  1. Follow up with PCP on July 6th as scheduled 2. Follow up with ID clinic in 1 week, June 9th 3. Please obtain BMP/CBC in one week 4. Please follow up on the following pending results: Blood culture (final result)  Home Health: None Equipment/Devices: None  Discharge Condition: Stable CODE STATUS: Full code Diet recommendation: Carb modified   Brief/Interim Summary:  Admission HPI written by Rhetta Mura, DO  HPI: Ulla Mckiernan is a 45 y.o. female with medical history significant for type 2 diabetes mellitus, who is admitted to [facility] on 01/20/2021 with sepsis due to right lower extremity cellulitis after presenting from home to [facility] ED complaining of right foot pain.   Of note, my discussions with this patient were assisted by profession translator service in setting of patient being Spanish speaking predominant.   The patient reports 2 days of progressive right foot pain associated with erythema, swelling, and increased warmth to touch over the dorsal aspect.  Denies any associated drainage, including no evidence of purulent drainage.  Denies any preceding trauma or crush injury.  She denies any known recent break in the skin integrity associate with the right lower extremity, and is unaware of a small wound on the plantar aspect of her right foot.  Denies any associated numbness or paresthesias.  Denies any known history of cellulitis.  She reports associated subjective fever, but denies chills, full body rigors, or generalized myalgias.  No recent traveling.  Denies any associated nausea, vomiting, diarrhea, or abdominal pain.  She also denies any recent headache, neck stiffness, rhinitis, rhinorrhea,  sore throat, shortness of breath, wheezing, cough, or rash in any other location.  No known COVID-19 exposures.  Denies any recent dysuria, gross hematuria, or change in urinary urgency/frequency.  She also denies any recent chest pain, diaphoresis, palpitations, dizziness, presyncope, or syncope.  She acknowledges a history of diabetes mellitus, reporting that she was previously on regular insulin once a day as well as metformin, but conveys that she has been completely off of all insulin and all oral hypoglycemic agents over the last 3 years, confirming no pharmacologic intervention in the interval.  He states that this is as a result of not actively receiving routine medical care over that time.  While she reports a history of gestational diabetes, she also confirms her diagnosis of diabetes predated this. She reports that she is amenable to resumption of pharmacologic diabetic intervention, and is also amenable to meeting with the diabetic educator.  I will plan denies any recreational drug use or routine/recent alcohol consumption.   Hospital course:  Sepsis Present on admission. Secondary to cellulitis. Started empirically on Vancomycin and Ceftriaxone IV. Blood cultures obtained on admission and are no growth to date. Patient with improvement of cellulitis and was transitioned to Cefdinir PO for discharge to complete a 10 day course of antibiotics.  Cellulitis of right foot Secondary to foot wound in setting of likely poorly controlled diabetes. Foot x-ray and MRI significant for no evidence of osteomyelitis. Orthopedic surgery was consulted on admission. Antibiotics as mentioned above. Patient to follow-up with infectious disease as an outpatient to ensure infection resolution.  Diabetes mellitus, type 2 Poorly controlled. Last hemoglobin A1C of 8.3% in 2018. Patient with history that is consistent with  neuropathy of feet. Hemoglobin A1C of 13.6% this admission. She was managed on Lantus but  transitioned to Novolin 70/30 on discharge secondary to cost. PCP follow-up scheduled. Also prescribed diabetic supplies.  Hyponatremia Likely pseudohyponatremia in setting of hyperglycemia.   Discharge Diagnoses:  Principal Problem:   Cellulitis of right lower extremity Active Problems:   Right foot pain   Hyperglycemia   Diabetes mellitus without complication ([place])   Sepsis [facility])    Discharge Instructions  Discharge Instructions    Increase activity slowly   Complete by: As directed    No wound care   Complete by: As directed      Allergies as of 01/22/2021   No Known Allergies     Medication List    STOP taking these medications   aspirin EC 81 MG tablet   ibuprofen 600 MG tablet Commonly known as: ADVIL     TAKE these medications   blood glucose meter kit and supplies Dispense based on patient and insurance preference. Use Before insulin twice daily.   cefdinir 300 MG capsule Commonly known as: OMNICEF Take 1 capsule (300 mg total) by mouth 2 (two) times daily for 8 days.   Insulin Pen Needle 31G X 5 MM Misc BD Pen Needles- brand specific. Use with insulin pens   metFORMIN 850 MG tablet Commonly known as: Glucophage Take 1 tablet (850 mg total) by mouth daily with breakfast. What changed:   medication strength  how much to take  when to take this   NovoLIN 70/30 FlexPen Relion (70-30) 100 UNIT/ML KwikPen Generic drug: insulin isophane & regular human Inject 10 Units into the skin 2 (two) times daily with breakfast and lunch.       Follow-up Information    [facility]. Go on 02/27/2021.   Specialty: Family Medicine Why: at 10:50am for hospital follow-up Contact information: [place] [phone]       Jabier Mutton, MD. Schedule an appointment as soon as possible for a visit on 01/31/2021.   Specialty: Infectious Diseases Why: 2:00 PM. Follow-up on cellulitis. Contact  information: [place] [address] [phone]              No Known Allergies  Consultations:  Orthopedic surgery   Procedures/Studies: DG Chest 2 View  Result Date: 01/20/2021 CLINICAL DATA:  Possible sepsis EXAM: CHEST - 2 VIEW COMPARISON:  04/04/2016 FINDINGS: The heart size and mediastinal contours are within normal limits. Both lungs are clear. The visualized skeletal structures are unremarkable. IMPRESSION: No active cardiopulmonary disease. Electronically Signed   By: Inez Catalina M.D.   On: 01/20/2021 18:50   MR FOOT RIGHT WO CONTRAST  Result Date: 01/21/2021 CLINICAL DATA:  Type 2 diabetes. Right lower extremity cellulitis. No cyst seated drainage. No prior trauma. EXAM: MRI OF THE RIGHT FOREFOOT WITHOUT CONTRAST TECHNIQUE: Multiplanar, multisequence MR imaging of the right forefoot was performed. No intravenous contrast was administered. COMPARISON:  None. FINDINGS: Bones/Joint/Cartilage No marrow signal abnormality. No fracture or dislocation. Normal alignment. No joint effusion. No periosteal reaction or bone destruction. Ligaments Collateral ligaments are intact.  Lisfranc ligament is intact. Muscles and Tendons Flexor, peroneal and extensor compartment tendons are intact. Muscles are normal. Soft tissue No fluid collection or hematoma. No soft tissue mass. Soft tissue edema in the subcutaneous fat along the dorsal aspect of the foot which may be reactive edema versus cellulitis. IMPRESSION: 1. No osteomyelitis of the  right forefoot. 2. Soft tissue edema in the subcutaneous fat along the dorsal aspect of the foot which may be reactive edema versus cellulitis. No drainable fluid collection to suggest an abscess. Electronically Signed   By: Kathreen Devoid   On: 01/21/2021 09:11   DG Foot Complete Right  Result Date: 01/20/2021 CLINICAL DATA:  Right foot pain and swelling for 2 days.  No injury. EXAM: RIGHT FOOT COMPLETE - 3+ VIEW COMPARISON:  None.  FINDINGS: No fracture or bone lesion. Joints are normally spaced and aligned.  No arthropathic changes. Soft tissue swelling most evident over the dorsal forefoot. No soft tissue air or radiopaque foreign body. IMPRESSION: 1. No fracture, bone lesion or joint abnormality. 2. Forefoot soft tissue swelling. Electronically Signed   By: Lajean Manes M.D.   On: 01/20/2021 18:51     Subjective: Some mild pain with ambulation. No other issues.  Discharge Exam: Vitals:   01/22/21 0808 01/22/21 1359  BP: (!) 142/75 (!) 142/81  Pulse: 97 100  Resp: 16 18  Temp: 98.3 F (36.8 C) 98 F (36.7 C)  SpO2: 98% 100%   Vitals:   01/21/21 2309 01/22/21 0316 01/22/21 0808 01/22/21 1359  BP: 117/65 136/71 (!) 142/75 (!) 142/81  Pulse: 98 100 97 100  Resp: 18 18 16 18   Temp: 98.7 F (37.1 C) 98.2 F (36.8 C) 98.3 F (36.8 C) 98 F (36.7 C)  TempSrc: Oral Oral Oral Oral  SpO2: 99% 100% 98% 100%  Weight:  61.7 kg      General: Pt is alert, awake, not in acute distress Cardiovascular: RRR, S1/S2 +, no rubs, no gallops Respiratory: CTA bilaterally, no wheezing, no rhonchi Abdominal: Soft, NT, ND, bowel sounds + Extremities: mild edema of right foot with erythema on lateral dorsal surface    The results of significant diagnostics from this hospitalization (including imaging, microbiology, ancillary and laboratory) are listed below for reference.     Microbiology: Recent Results (from the past 240 hour(s))  Culture, blood (routine x 2)     Status: None (Preliminary result)   Collection Time: 01/20/21  4:44 PM   Specimen: Right Antecubital; Blood  Result Value Ref Range Status   Specimen Description RIGHT ANTECUBITAL  Final   Special Requests   Final    BOTTLES DRAWN AEROBIC AND ANAEROBIC Blood Culture adequate volume   Culture   Final    NO GROWTH 2 DAYS Performed at [facility], [address], [place], [place] [address]    Report Status PENDING  Incomplete  Culture, blood  (routine x 2)     Status: None (Preliminary result)   Collection Time: 01/20/21  5:45 PM   Specimen: BLOOD LEFT FOREARM  Result Value Ref Range Status   Specimen Description BLOOD LEFT FOREARM  Final   Special Requests   Final    BOTTLES DRAWN AEROBIC AND ANAEROBIC Blood Culture adequate volume   Culture   Final    NO GROWTH 2 DAYS Performed at [facility], [place] [address], [place], [place] [address]    Report Status PENDING  Incomplete  Resp Panel by RT-PCR (Flu A&B, Covid) Nasopharyngeal Swab     Status: None   Collection Time: 01/20/21  5:45 PM   Specimen: Nasopharyngeal Swab; Nasopharyngeal(NP) swabs in vial transport medium  Result Value Ref Range Status   SARS Coronavirus 2 by RT PCR NEGATIVE NEGATIVE Final    Comment: (NOTE) SARS-CoV-2 target nucleic acids are NOT DETECTED.  The  SARS-CoV-2 RNA is generally detectable in upper respiratory specimens during the acute phase of infection. The lowest concentration of SARS-CoV-2 viral copies this assay can detect is 138 copies/mL. A negative result does not preclude SARS-Cov-2 infection and should not be used as the sole basis for treatment or other patient management decisions. A negative result may occur with  improper specimen collection/handling, submission of specimen other than nasopharyngeal swab, presence of viral mutation(s) within the areas targeted by this assay, and inadequate number of viral copies(<138 copies/mL). A negative result must be combined with clinical observations, patient history, and epidemiological information. The expected result is Negative.  Fact Sheet for Patients:  EntrepreneurPulse.com.au  Fact Sheet for Healthcare Providers:  IncredibleEmployment.be  This test is no t yet approved or cleared by the [place] FDA and  has been authorized for detection and/or diagnosis of SARS-CoV-2 by FDA under an Emergency Use Authorization (EUA). This EUA will  remain  in effect (meaning this test can be used) for the duration of the COVID-19 declaration under Section 564(b)(1) of the Act, 21 U.S.C.section 360bbb-3(b)(1), unless the authorization is terminated  or revoked sooner.       Influenza A by PCR NEGATIVE NEGATIVE Final   Influenza B by PCR NEGATIVE NEGATIVE Final    Comment: (NOTE) The Xpert Xpress SARS-CoV-2/FLU/RSV plus assay is intended as an aid in the diagnosis of influenza from Nasopharyngeal swab specimens and should not be used as a sole basis for treatment. Nasal washings and aspirates are unacceptable for Xpert Xpress SARS-CoV-2/FLU/RSV testing.  Fact Sheet for Patients: EntrepreneurPulse.com.au  Fact Sheet for Healthcare Providers: IncredibleEmployment.be  This test is not yet approved or cleared by the [place] FDA and has been authorized for detection and/or diagnosis of SARS-CoV-2 by FDA under an Emergency Use Authorization (EUA). This EUA will remain in effect (meaning this test can be used) for the duration of the COVID-19 declaration under Section 564(b)(1) of the Act, 21 U.S.C. section 360bbb-3(b)(1), unless the authorization is terminated or revoked.  Performed at [facility], [place] [address] [place], [place] [address]      Labs: BNP (last 3 results) No results for input(s): BNP in the last 8760 hours. Basic Metabolic Panel: Recent Labs  Lab 01/20/21 1604 01/20/21 2333 01/22/21 0751  NA 130* 131* 132*  K 3.9 3.2* 3.6  CL 99 103 105  CO2 18* 17* 17*  GLUCOSE 385* 236* 229*  BUN 13 9 7   CREATININE 0.69 0.63 0.50  CALCIUM 8.2* 8.0* 8.2*  MG  --  1.8  --    Liver Function Tests: Recent Labs  Lab 01/20/21 2333  AST 10*  ALT 13  ALKPHOS 134*  BILITOT 0.5  PROT 5.7*  ALBUMIN 2.8*   No results for input(s): LIPASE, AMYLASE in the last 168 hours. No results for input(s): AMMONIA in the last 168 hours. CBC: Recent Labs  Lab 01/20/21 1604  01/20/21 2333 01/22/21 0751  WBC 21.6* 18.6* 16.6*  NEUTROABS 18.7* 15.0*  --   HGB 11.4* 10.5* 10.4*  HCT 35.4* 31.3* 31.0*  MCV 87.4 85.8 85.4  PLT 394 379 382   Cardiac Enzymes: No results for input(s): CKTOTAL, CKMB, CKMBINDEX, TROPONINI in the last 168 hours. BNP: Invalid input(s): POCBNP CBG: Recent Labs  Lab 01/21/21 1331 01/21/21 1829 01/21/21 2302 01/22/21 0606 01/22/21 1358  GLUCAP 249* 296* 274* 250* 315*   D-Dimer No results for input(s): DDIMER in the last 72 hours. Hgb A1c Recent Labs  01/20/21 1745 01/20/21 2333  HGBA1C 13.6* 13.6*   Lipid Profile No results for input(s): CHOL, HDL, LDLCALC, TRIG, CHOLHDL, LDLDIRECT in the last 72 hours. Thyroid function studies No results for input(s): TSH, T4TOTAL, T3FREE, THYROIDAB in the last 72 hours.  Invalid input(s): FREET3 Anemia work up No results for input(s): VITAMINB12, FOLATE, FERRITIN, TIBC, IRON, RETICCTPCT in the last 72 hours. Urinalysis    Component Value Date/Time   COLORURINE YELLOW 03/23/2017 0045   APPEARANCEUR HAZY (A) 03/23/2017 0045   LABSPEC 1.010 09/02/2017 1328   PHURINE 6.0 09/02/2017 1328   GLUCOSEU NEGATIVE 09/02/2017 1328   HGBUR NEGATIVE 09/02/2017 1328   [place] 09/02/2017 1328   [place] 09/02/2017 1328   PROTEINUR NEGATIVE 09/02/2017 1328   UROBILINOGEN 1.0 09/02/2017 1328   NITRITE NEGATIVE 09/02/2017 1328   LEUKOCYTESUR SMALL (A) 09/02/2017 1328   Sepsis Labs Invalid input(s): PROCALCITONIN,  WBC,  LACTICIDVEN Microbiology Recent Results (from the past 240 hour(s))  Culture, blood (routine x 2)     Status: None (Preliminary result)   Collection Time: 01/20/21  4:44 PM   Specimen: Right Antecubital; Blood  Result Value Ref Range Status   Specimen Description RIGHT ANTECUBITAL  Final   Special Requests   Final    BOTTLES DRAWN AEROBIC AND ANAEROBIC Blood Culture adequate volume   Culture   Final    NO GROWTH 2 DAYS Performed at [facility], [place] [address], [place], [place] [address]    Report Status PENDING  Incomplete  Culture, blood (routine x 2)     Status: None (Preliminary result)   Collection Time: 01/20/21  5:45 PM   Specimen: BLOOD LEFT FOREARM  Result Value Ref Range Status   Specimen Description BLOOD LEFT FOREARM  Final   Special Requests   Final    BOTTLES DRAWN AEROBIC AND ANAEROBIC Blood Culture adequate volume   Culture   Final    NO GROWTH 2 DAYS Performed at [facility], [place] [address] [place], [place] [address]    Report Status PENDING  Incomplete  Resp Panel by RT-PCR (Flu A&B, Covid) Nasopharyngeal Swab     Status: None   Collection Time: 01/20/21  5:45 PM   Specimen: Nasopharyngeal Swab; Nasopharyngeal(NP) swabs in vial transport medium  Result Value Ref Range Status   SARS Coronavirus 2 by RT PCR NEGATIVE NEGATIVE Final    Comment: (NOTE) SARS-CoV-2 target nucleic acids are NOT DETECTED.  The SARS-CoV-2 RNA is generally detectable in upper respiratory specimens during the acute phase of infection. The lowest concentration of SARS-CoV-2 viral copies this assay can detect is 138 copies/mL. A negative result does not preclude SARS-Cov-2 infection and should not be used as the sole basis for treatment or other patient management decisions. A negative result may occur with  improper specimen collection/handling, submission of specimen other than nasopharyngeal swab, presence of viral mutation(s) within the areas targeted by this assay, and inadequate number of viral copies(<138 copies/mL). A negative result must be combined with clinical observations, patient history, and epidemiological information. The expected result is Negative.  Fact Sheet for Patients:  EntrepreneurPulse.com.au  Fact Sheet for Healthcare Providers:  IncredibleEmployment.be  This test is no t yet approved or cleared by the [place] FDA and  has been authorized for  detection and/or diagnosis of SARS-CoV-2 by FDA under an Emergency Use Authorization (EUA). This EUA will remain  in effect (meaning this test can be used) for the duration of the COVID-19 declaration  under Section 564(b)(1) of the Act, 21 U.S.C.section 360bbb-3(b)(1), unless the authorization is terminated  or revoked sooner.       Influenza A by PCR NEGATIVE NEGATIVE Final   Influenza B by PCR NEGATIVE NEGATIVE Final    Comment: (NOTE) The Xpert Xpress SARS-CoV-2/FLU/RSV plus assay is intended as an aid in the diagnosis of influenza from Nasopharyngeal swab specimens and should not be used as a sole basis for treatment. Nasal washings and aspirates are unacceptable for Xpert Xpress SARS-CoV-2/FLU/RSV testing.  Fact Sheet for Patients: EntrepreneurPulse.com.au  Fact Sheet for Healthcare Providers: IncredibleEmployment.be  This test is not yet approved or cleared by the [place] FDA and has been authorized for detection and/or diagnosis of SARS-CoV-2 by FDA under an Emergency Use Authorization (EUA). This EUA will remain in effect (meaning this test can be used) for the duration of the COVID-19 declaration under Section 564(b)(1) of the Act, 21 U.S.C. section 360bbb-3(b)(1), unless the authorization is terminated or revoked.  Performed at [facility], [place] [address], [place], [place] [address]      Time coordinating discharge: 35 minutes  SIGNED:   Cordelia Poche, MD [facility] 01/22/2021, 3:36 PM

## 2021-01-22 NOTE — Discharge Instructions (Signed)
Michele Morales,  You were in the hospital with a foot infection. You were started on antibiotics with improvement and will need to continue antibiotics on discharge. You also were found to have worsening diabetes. You have been started on insulin and can return to your metformin as well. These prescriptions have been ordered for you. Please make sure to follow-up with the infectious disease doctor to ensure continued improvement of your foot.

## 2021-01-22 NOTE — Progress Notes (Signed)
Patient ID: Michele Morales, female   DOB: July 26, 1976, 45 y.o.   MRN: [id number]   LOS: 2 days   Subjective: Pain and redness continue to improve in foot. Now only pain with WB.   Objective: Vital signs in last 24 hours: Temp:  [98.2 F (36.8 C)-98.7 F (37.1 C)] 98.3 F (36.8 C) (05/31 0808) Pulse Rate:  [91-100] 97 (05/31 0808) Resp:  [14-19] 16 (05/31 0808) BP: (113-142)/(65-75) 142/75 (05/31 0808) SpO2:  [98 %-100 %] 98 % (05/31 0808) Weight:  [61.7 kg] 61.7 kg (05/31 0316) Last BM Date: 01/19/21 (per patient 2 days ago)   Laboratory  CBC Recent Labs    01/20/21 2333 01/22/21 0751  WBC 18.6* 16.6*  HGB 10.5* 10.4*  HCT 31.3* 31.0*  PLT 379 382   BMET Recent Labs    01/20/21 1604 01/20/21 2333  NA 130* 131*  K 3.9 3.2*  CL 99 103  CO2 18* 17*  GLUCOSE 385* 236*  BUN 13 9  CREATININE 0.69 0.63  CALCIUM 8.2* 8.0*     Physical Exam General appearance: alert and no distress  Right foot: Still some erythema dorsal and plantar lateral forefoot but retreating from outlined area. No TTP. 2+ DP/PT.   Assessment/Plan: Right foot cellulitis -- Anticipate will clear with full abx course. No indication for surgical intervention.    Freeman Caldron, PA-C Orthopedic Surgery [phone] 01/22/2021

## 2021-01-22 NOTE — Progress Notes (Signed)
Inpatient Diabetes Program Recommendations  AACE/ADA: New Consensus Statement on Inpatient Glycemic Control (2015)  Target Ranges:  Prepandial:   less than 140 mg/dL      Peak postprandial:   less than 180 mg/dL (1-2 hours)      Critically ill patients:  140 - 180 mg/dL   Lab Results  Component Value Date   GLUCAP 250 (H) 01/22/2021   HGBA1C 13.6 (H) 01/20/2021    Review of Glycemic Control Results for Michele Morales, Michele Morales (MRN [id number]) as of 01/22/2021 11:46  Ref. Range 01/21/2021 06:44 01/21/2021 13:31 01/21/2021 18:29 01/21/2021 23:02 01/22/2021 06:06  Glucose-Capillary Latest Ref Range: 70 - 99 mg/dL 417 (H) 408 (H) 144 (H) 274 (H) 250 (H)   Diabetes history: DM 2 Outpatient Diabetes medications:  None Current orders for Inpatient glycemic control:  Novolog 0-6 units q 6 hours Lantus 18 units q HS  Inpatient Diabetes Program Recommendations:    Spoke with patient using interpreter/stratus.  She had gestational DM 3 years ago with last pregnancy and was on insulin.  She states she has not been on insulin since then or any medications for DM. She states she has a meter and that when she checks her blood sugars at home they are in the 300-400's.  She states that she has low energy at home.  Discussed current A1C of 13.6% and that her average blood sugar are in the 300's.  Discussed goal A1C of 7% and that goal CBG's are 80-130 mg/dL fasting and less than 818 mg/dL after eating.  Reviewed signs, symptoms and treatment of both high and low blood sugars.  Patient was able to teach back proper treatment of low blood sugar stating " I need juice or regular soda".   She does not have Rx. Coverage and states he has bought insulin from [facility] in the past. She feels comfortable checking and administering insulin .   At d/c, recommend Reli-on Novolin 70/30 10 units bid.  This will need titration outpatient and she will need close f/u.  We discussed all the complications of DM and why control of DM is  important.  Patient verbalized understanding.  She has meter/strips at home.  Again reminded them of the use of [facility] for medications.  Need to make sure she can afford medications after d/c from hospital.   Thanks,  Beryl Meager, RN, BC-ADM Inpatient Diabetes Coordinator Pager [phone] (8a-5p)

## 2021-01-22 NOTE — Progress Notes (Signed)
Nutrition Consult - Diet Education  RD consulted for nutrition education regarding diabetes.   Lab Results  Component Value Date   HGBA1C 13.6 (H) 01/20/2021    Spoke with patient via Jerre Simon [phone]). RD provided "Carbohydrate Counting for People with Diabetes" Spanish handout from the Academy of Nutrition and Dietetics. Discussed different food groups and their effects on blood sugar, emphasizing carbohydrate-containing foods. Provided list of carbohydrates and recommended serving sizes of common foods.  Discussed importance of controlled and consistent carbohydrate intake throughout the day. Provided examples of ways to balance meals/snacks and encouraged intake of high-fiber, whole grain complex carbohydrates. Teach back method used.  Expect good compliance.  Body mass index is 25.7 kg/m. Pt meets criteria for overweight based on current BMI.  Current diet order is CHO modified, patient is consuming approximately 75-100% of meals at this time. Labs and medications reviewed. No further nutrition interventions warranted at this time. RD contact information provided. If additional nutrition issues arise, please re-consult RD.   Gabriel Rainwater, RD, LDN, CNSC Please refer to [facility] for contact information.

## 2021-01-22 NOTE — Progress Notes (Signed)
Patient d/c home with husband from [facility]E. Discharge instructions and education of medications given via interpretor. Patient expressed understanding.

## 2021-01-25 LAB — CULTURE, BLOOD (ROUTINE X 2)
Culture: NO GROWTH
Special Requests: ADEQUATE

## 2021-01-31 ENCOUNTER — Ambulatory Visit (INDEPENDENT_AMBULATORY_CARE_PROVIDER_SITE_OTHER): Payer: Self-pay | Admitting: Internal Medicine

## 2021-01-31 ENCOUNTER — Encounter: Payer: Self-pay | Admitting: Internal Medicine

## 2021-01-31 ENCOUNTER — Other Ambulatory Visit: Payer: Self-pay

## 2021-01-31 VITALS — BP 162/99 | HR 104 | Temp 97.9°F | Resp 16 | Ht 61.0 in | Wt 135.0 lb

## 2021-01-31 DIAGNOSIS — L089 Local infection of the skin and subcutaneous tissue, unspecified: Secondary | ICD-10-CM

## 2021-01-31 DIAGNOSIS — E11628 Type 2 diabetes mellitus with other skin complications: Secondary | ICD-10-CM

## 2021-01-31 MED ORDER — AMOXICILLIN-POT CLAVULANATE 875-125 MG PO TABS: 1.0000 | ORAL_TABLET | Freq: Two times a day (BID) | ORAL | 0 refills | Status: AC

## 2021-01-31 MED ORDER — DOXYCYCLINE HYCLATE 100 MG PO TABS: 100.0000 mg | ORAL_TABLET | Freq: Two times a day (BID) | ORAL | 0 refills | Status: AC

## 2021-01-31 NOTE — Patient Instructions (Addendum)
You still have some infection in your foot. I will give you another 2 weeks of treatment   I will also refer you to podiatry to see if they need to do any surgical debridement and for long term monitoring    Follow up with me in 4-6 weeks   Blood test today Antibiotics to take starting today (for total 2 weeks) 1) doxycycline 100 mg po bid 2) augmentin 875 mg po bid   You were referred to family medicine for primary care, please contact them for appointment [facility]. Go on 02/27/2021.   Specialty: Family Medicine Why: at 10:50am for hospital follow-up Contact information: [address] [place] [address] [phone]

## 2021-01-31 NOTE — Progress Notes (Signed)
[facility]  Reason for Consult:diabetic foot infection Referring Provider: Caleb Popp, MD hospital discharge consult    Patient Active Problem List   Diagnosis Date Noted   Right foot pain 01/21/2021   Hyperglycemia 01/21/2021   Diabetes mellitus without complication (HCC)    Sepsis (HCC)    Cellulitis of right lower extremity 01/20/2021   Grand multipara 07/10/2017   Language barrier 05/07/2017   Diabetes mellitus affecting pregnancy, antepartum 04/23/2017      HPI: Michele Morales is a 45 y.o. female without previous medical care but known DM2 admitted late 12/2020 to [place] [facility] for sepsis in setting diabetic foot infection here for f/u  I reviewed chart Hx via language interpreter  She came to the hospital with 2-3 days acute swelling/redness/pain in right foot. Bcx was negative. Mri showed no osteomyelitis. Orthopedics consulted nothing to be done. No podiatry consulted  She received 3 days vanc/ceftriaxone and discharged on 7 days of cefdinir  Since discharge the swelling had blistered laterally at the 5th mtp joint. No purulence/bleeding. The swelling decreased. The pain is gone. No fever/chill  She doesn't have a pcp She was given metformin and insulin 70/30   No n/v/diarrhea    Review of Systems: ROS All other ros negative      Past Medical History:  Diagnosis Date   Diabetes mellitus without complication (HCC)    IDDM   Hypercholesteremia     Social History   Tobacco Use   Smoking status: Never   Smokeless tobacco: Never  Substance Use Topics   Alcohol use: No   Drug use: No    Family History  Problem Relation Age of Onset   Diabetes Mother     No Known Allergies  OBJECTIVE: Vitals:   01/31/21 1407  BP: (!) 162/99  Pulse: (!) 104  Resp: 16  Temp: 97.9 F (36.6 C)  SpO2: 99%  Weight: 135 lb (61.2 kg)  Height: 5\' 1"  (1.549 m)   Body mass index is 25.51 kg/m.   Physical  Exam General/constitutional: no distress, pleasant HEENT: Normocephalic, PER, Conj Clear, EOMI, Oropharynx clear Neck supple CV: rrr no mrg Lungs: clear to auscultation, normal respiratory effort Abd: Soft, Nontender Ext: no edema Skin: No Rash Neuro: nonfocal MSK: see picture below Psych alert/oriented       Lab: Last metabolic panel Lab Results  Component Value Date   GLUCOSE 229 (H) 01/22/2021   NA 132 (L) 01/22/2021   K 3.6 01/22/2021   CL 105 01/22/2021   CO2 17 (L) 01/22/2021   BUN 7 01/22/2021   CREATININE 0.50 01/22/2021   GFRNONAA >60 01/22/2021   GFRAA >60 09/09/2017   CALCIUM 8.2 (L) 01/22/2021   PROT 5.7 (L) 01/20/2021   ALBUMIN 2.8 (L) 01/20/2021   BILITOT 0.5 01/20/2021   ALKPHOS 134 (H) 01/20/2021   AST 10 (L) 01/20/2021   ALT 13 01/20/2021   ANIONGAP 10 01/22/2021   Lab Results  Component Value Date   WBC 16.6 (H) 01/22/2021   HGB 10.4 (L) 01/22/2021   HCT 31.0 (L) 01/22/2021   MCV 85.4 01/22/2021   PLT 382 01/22/2021    Crp: Lab Results  Component Value Date   CRP 8.9 (H) 01/20/2021    Microbiology: 5/209 bcx negative  Serology:  Imaging: 5/30 mri right foot 1. No osteomyelitis of the right forefoot. 2. Soft tissue edema in the subcutaneous fat along the dorsal aspect of the foot which may be  reactive edema versus cellulitis. No drainable fluid collection to suggest an abscess.  Assessment/plan: Problem List Items Addressed This Visit   None Visit Diagnoses     Diabetic foot infection (HCC)    -  Primary   Relevant Orders   CBC   COMPLETE METABOLIC PANEL WITH GFR   C-reactive protein   ABI   Ambulatory referral to Podiatry       We need to have podiatry evaluation of her foot Mri is early and might miss OM She has significant swelling in the foot and also a sinus tract vs open wound. At this time, I will continue abx for 2 weeks for severe soft tissue infection, evaluate abi, and continue to monitor to see how she  does off abx, along with podiatry evaluation for potential need for debridement  -augmentin/doxy 2 weeks -podiatry referal -labs -abi -f/u 6 weeks -f/u pcp     Follow-up: Return in about 6 weeks (around 03/14/2021).  I spent 60 minute reviewing data/chart, and coordinating care and providing counseling/discussing diagnostics/treatment plan with patient   Raymondo Band, MD [facility] [phone] pager   [phone] cell 01/31/2021, 2:16 PM

## 2021-02-01 ENCOUNTER — Encounter: Payer: Self-pay | Admitting: Podiatry

## 2021-02-01 ENCOUNTER — Telehealth: Payer: Self-pay | Admitting: Podiatry

## 2021-02-01 LAB — CBC
HCT: 36.2 % (ref 35.0–45.0)
Hemoglobin: 11.9 g/dL (ref 11.7–15.5)
MCH: 28.3 pg (ref 27.0–33.0)
MCHC: 32.9 g/dL (ref 32.0–36.0)
MCV: 86.2 fL (ref 80.0–100.0)
MPV: 9.9 fL (ref 7.5–12.5)
Platelets: 666 10*3/uL — ABNORMAL HIGH (ref 140–400)
RBC: 4.2 10*6/uL (ref 3.80–5.10)
RDW: 13.8 % (ref 11.0–15.0)
WBC: 10.5 10*3/uL (ref 3.8–10.8)

## 2021-02-01 LAB — COMPLETE METABOLIC PANEL WITH GFR
AG Ratio: 1.1 (calc) (ref 1.0–2.5)
ALT: 8 U/L (ref 6–29)
AST: 9 U/L — ABNORMAL LOW (ref 10–30)
Albumin: 3.6 g/dL (ref 3.6–5.1)
Alkaline phosphatase (APISO): 98 U/L (ref 31–125)
BUN: 10 mg/dL (ref 7–25)
CO2: 29 mmol/L (ref 20–32)
Calcium: 9.2 mg/dL (ref 8.6–10.2)
Chloride: 101 mmol/L (ref 98–110)
Creat: 0.51 mg/dL (ref 0.50–1.10)
GFR, Est African American: 136 mL/min/{1.73_m2} (ref >=60)
GFR, Est Non African American: 117 mL/min/{1.73_m2} (ref >=60)
Globulin: 3.3 g/dL (calc) (ref 1.9–3.7)
Glucose, Bld: 133 mg/dL — ABNORMAL HIGH (ref 65–99)
Potassium: 4.5 mmol/L (ref 3.5–5.3)
Sodium: 138 mmol/L (ref 135–146)
Total Bilirubin: 0.3 mg/dL (ref 0.2–1.2)
Total Protein: 6.9 g/dL (ref 6.1–8.1)

## 2021-02-01 LAB — C-REACTIVE PROTEIN: CRP: 9.5 mg/L — ABNORMAL HIGH (ref <8.0)

## 2021-02-18 ENCOUNTER — Ambulatory Visit (HOSPITAL_COMMUNITY)
Admission: RE | Admit: 2021-02-18 | Discharge: 2021-02-18 | Disposition: A | Discharge status: Home / Self Care | Payer: Self-pay | Source: Ambulatory Visit | Attending: Internal Medicine | Admitting: Internal Medicine

## 2021-02-18 ENCOUNTER — Other Ambulatory Visit: Payer: Self-pay

## 2021-02-18 DIAGNOSIS — E11628 Type 2 diabetes mellitus with other skin complications: Secondary | ICD-10-CM | POA: Insufficient documentation

## 2021-02-18 DIAGNOSIS — L089 Local infection of the skin and subcutaneous tissue, unspecified: Secondary | ICD-10-CM

## 2021-02-18 NOTE — Progress Notes (Signed)
VASCULAR LAB    ABIs have been performed.  See CV proc for preliminary results.   Sherrina Zaugg, RVT 02/18/2021, 9:39 AM

## 2021-02-27 ENCOUNTER — Encounter (INDEPENDENT_AMBULATORY_CARE_PROVIDER_SITE_OTHER): Payer: Self-pay | Admitting: Primary Care

## 2021-02-27 ENCOUNTER — Ambulatory Visit (INDEPENDENT_AMBULATORY_CARE_PROVIDER_SITE_OTHER): Payer: Self-pay | Admitting: Primary Care

## 2021-02-27 ENCOUNTER — Other Ambulatory Visit (HOSPITAL_COMMUNITY): Payer: Self-pay

## 2021-02-27 ENCOUNTER — Other Ambulatory Visit: Payer: Self-pay

## 2021-02-27 VITALS — BP 135/85 | HR 101 | Temp 98.4°F | Wt 134.4 lb

## 2021-02-27 DIAGNOSIS — Z789 Other specified health status: Secondary | ICD-10-CM

## 2021-02-27 DIAGNOSIS — Z7689 Persons encountering health services in other specified circumstances: Secondary | ICD-10-CM

## 2021-02-27 DIAGNOSIS — E119 Type 2 diabetes mellitus without complications: Secondary | ICD-10-CM

## 2021-02-27 DIAGNOSIS — Z09 Encounter for follow-up examination after completed treatment for conditions other than malignant neoplasm: Secondary | ICD-10-CM

## 2021-02-27 DIAGNOSIS — Z13220 Encounter for screening for lipoid disorders: Secondary | ICD-10-CM

## 2021-02-27 MED ORDER — LISINOPRIL 2.5 MG PO TABS
2.5000 mg | ORAL_TABLET | Freq: Every day | ORAL | 1 refills | Status: DC
  Filled 2021-02-27: qty 30, 30d supply, fill #0

## 2021-02-27 MED ORDER — METFORMIN HCL 1000 MG PO TABS
1000.0000 mg | ORAL_TABLET | Freq: Two times a day (BID) | ORAL | 3 refills | Status: DC
  Filled 2021-02-27: qty 60, 30d supply, fill #0

## 2021-02-27 MED ORDER — BASAGLAR KWIKPEN 100 UNIT/ML ~~LOC~~ SOPN
15.0000 [IU] | PEN_INJECTOR | Freq: Two times a day (BID) | SUBCUTANEOUS | 3 refills | Status: DC
Start: 2021-02-27 — End: 2021-04-02
  Filled 2021-02-27: qty 6, 20d supply, fill #0

## 2021-02-27 MED ORDER — GLIPIZIDE 10 MG PO TABS
10.0000 mg | ORAL_TABLET | Freq: Two times a day (BID) | ORAL | 1 refills | Status: DC
  Filled 2021-02-27: qty 60, 30d supply, fill #0

## 2021-02-27 MED ORDER — FLUCONAZOLE 150 MG PO TABS
150.0000 mg | ORAL_TABLET | Freq: Every day | ORAL | 0 refills | Status: DC
  Filled 2021-02-27: qty 2, 2d supply, fill #0

## 2021-02-27 NOTE — Patient Instructions (Signed)
Diabetes mellitus y actividad física °Diabetes Mellitus and Exercise °Hacer actividad física habitualmente es importante para el estado de salud general, en especial para las personas que tienen diabetes mellitus. La actividad física no solo se reduce a bajar de peso. Aporta muchos beneficios para la salud, como aumento de la fuerza muscular y la densidad ósea, y reducción de las grasas corporales y el estrés. Esto mejora el estado físico, la flexibilidad y la resistencia, y todo ello redunda en un mejor estado de salud general. °¿Cuáles son los beneficios de la actividad física si tengo diabetes? °La actividad física tiene muchos beneficios para las personas con diabetes. Incluyen los siguientes: °Ayuda a bajar y mantener el azúcar en la sangre (glucosa) bajo control. °Mejora la respuesta del cuerpo a la hormona insulina porque optimiza la sensibilidad a la insulina. °Reduce la cantidad de insulina que el cuerpo necesita. °Reduce el riesgo de tener una enfermedad cardíaca porque: °Baja los niveles de colesterol “malo” y triglicéridos. °Aumenta los niveles de colesterol “bueno”. °Baja la presión arterial. °Disminuye la glucemia. °¿Cuál es mi plan de actividad? °El médico o un educador para la diabetes certificado pueden ayudarlo a elaborar un plan respecto del tipo y de la frecuencia de actividad física adecuado para usted. Esto se denomina “plan de actividad”. Asegúrese de lo siguiente: °Haga por lo menos 150 minutos semanales de ejercicios de intensidad media o alta. Los ejercicios pueden incluir caminar a paso rápido, andar en bicicleta o hacer gimnasia aeróbica en el agua. °Haga ejercicios de elongación y de fortalecimiento, como yoga o levantamiento de pesas, por lo menos 2 veces por semana. °Reparta la actividad en al menos 3 días de la semana. °Haga algún tipo de actividad física cada día. °No deje pasar más de 2 días seguidos sin hacer algún tipo de actividad física. °No permanezca inactivo durante más de 90  minutos seguidos. Tómese descansos frecuentes para caminar o estirarse. °Elija ejercicios o actividades que disfrute. Establezca objetivos realistas. °Comience lentamente y aumente de manera gradual la intensidad de la actividad física con el correr del tiempo. °¿Cómo controlo la diabetes durante la actividad física? °Controlar su nivel de glucemia °Contrólese la glucemia antes y después de ejercitarse. Si el nivel de glucemia es: °240 mg/dl (13.3 mmol/l) o más antes de comenzar a hacer actividad física, controle la orina para detectar la presencia de cetonas. Estas son sustancias químicas producidas por el hígado. Si tiene cetonas en la orina, no haga ejercicio hasta que la glucemia se normalice. °100 mg/dl (5.6 mmol/l) o menos, tome una colación que contenga entre 15 y 20 gramos de carbohidratos. Controle la glucemia 15 minutos después de la colación para asegurarse de que el nivel de glucosa esté por encima de 100 mg/dl (5.6 mmol/l) antes de comenzar a hacer actividad física. °Conozca los síntomas de la glucemia baja (hipoglucemia) y aprenda cómo tratarla. El riesgo de tener hipoglucemia aumenta durante y después de hacer actividad física. °Siga estos consejos y las instrucciones del médico °Tenga una colación de carbohidratos que sea de acción rápida antes, durante y después de ejercitarse, a fin de evitar o tratar la hipoglucemia. °Evite inyectarse insulina en las zonas del cuerpo que ejercitará. Por ejemplo, evite inyectarse insulina en: °Los brazos, cuando esté por jugar al tenis. °Las piernas, cuando esté por irse a trotar. °Lleve registros de sus hábitos de actividad física. Esto puede ayudarlos a usted y al médico a adaptar el plan de control de la diabetes según sea necesario. Escriba los siguientes datos: °Los alimentos que   consume antes y después de hacer actividad física. °Los niveles de glucemia antes y después de hacer ejercicios. °El tipo y cantidad de actividad física que realiza. °Trabaje con el  médico cuando comience un nuevo tipo de actividad física o ejercicio. Es posible que el médico deba hacer lo siguiente: °Asegurarse de que la actividad sea segura para usted. °Ajustar la insulina, los otros medicamentos y los alimentos que usted consume. °Beba mucha agua mientras hace ejercicio. Esto previene la pérdida de agua (deshidratación) y los problemas causados por mucho calor en el cuerpo (golpe de calor). °Dónde buscar más información °American Diabetes Association (Asociación Estadounidense de la Diabetes): www.diabetes.org °Resumen °Hacer actividad física habitualmente es importante para el estado de salud general, en especial para las personas que tienen diabetes mellitus. °Hacer actividad física tiene muchos beneficios para la salud. Aumenta la fuerza muscular y la densidad ósea, y reduce las grasas corporales y el estrés. También disminuye y controla la glucemia. °El médico o un educador para la diabetes certificado puede ayudarlo a elaborar un plan de actividades respecto del tipo y de la frecuencia de actividad física adecuados para usted. °Consulte al médico para asegurarse de que cualquier actividad nueva sea segura para usted. También trabaje con el médico para ajustar la insulina, los otros medicamentos y los alimentos que consume. °Esta información no tiene como fin reemplazar el consejo del médico. Asegúrese de hacerle al médico cualquier pregunta que tenga. °Document Revised: 08/12/2019 Document Reviewed: 08/12/2019 °Elsevier Patient Education © 2022 Elsevier Inc. ° °

## 2021-02-27 NOTE — Telephone Encounter (Signed)
error 

## 2021-02-27 NOTE — Progress Notes (Signed)
[facility]   Subjective:   [name] is a 45 y.o. Hispanic  female Michele Morales [phone]) interpretor  presents for hospital follow up and establish care. Admit date to the hospital was 01/20/21, patient was discharged from the hospital on 01/22/21, patient was admitted for: Cellulitis of right lower extremity, Right foot pain, Hyperglycemia, Diabetes mellitus without complication ([place]) and Sepsis ([place]). (A1C 13.6)   Past Medical History:  Diagnosis Date   Diabetes mellitus without complication (HCC)    IDDM   Hypercholesteremia      No Known Allergies    Current Outpatient Medications on File Prior to Visit  Medication Sig Dispense Refill   Accu-Chek Softclix Lancets lancets Use as directed. 100 each 5   Blood Glucose Monitoring Suppl (ACCU-CHEK GUIDE) w/Device KIT Use as directed 1 kit 0   glucose blood (ACCU-CHEK GUIDE) test strip Use as instructed 100 each 12   insulin isophane & regular human (NOVOLIN 70/30 FLEXPEN) (70-30) 100 UNIT/ML KwikPen Inject 10 Units into the skin 2 (two) times daily with breakfast and lunch. 15 mL 0   Insulin Pen Needle (PENTIPS) 32G X 4 MM MISC use as directed 200 each 3   metFORMIN (GLUCOPHAGE) 850 MG tablet Take 1 tablet (850 mg total) by mouth daily with breakfast. 30 tablet 2   No current facility-administered medications on file prior to visit.     Review of System: Review of Systems  Eyes:  Positive for blurred vision and redness.       Blurred   All other systems reviewed and are negative.  Objective:  BP 135/85 (BP Location: Right Arm, Patient Position: Sitting, Cuff Size: Normal)   Pulse (!) 101   Temp 98.4 F (36.9 C) (Oral)   Wt 134 lb 6.4 oz (61 kg)   LMP 02/25/2021 (Exact Date)   SpO2 98%   BMI 25.39 kg/m   Physical Exam: General Appearance: Well nourished, in no apparent distress. Eyes: PERRLA, EOMs, conjunctiva no swelling or erythema Sinuses: No Frontal/maxillary tenderness ENT/Mouth: Ext aud  canals clear, TMs without erythema, bulging. Hearing normal.  Neck: Supple, thyroid normal.  Respiratory: Respiratory effort normal, BS equal bilaterally without rales, rhonchi, wheezing or stridor.  Cardio: RRR with no MRGs. Brisk peripheral pulses without edema.  Abdomen: Soft, + BS.  Non tender, no guarding, rebound, hernias, masses. Lymphatics: Non tender without lymphadenopathy.  Musculoskeletal: Full ROM, 5/5 strength, normal gait.  Skin: Warm, dry without rashes, lesions, ecchymosis.  Neuro: Cranial nerves intact. Normal muscle tone, no cerebellar symptoms. Sensation intact.  Psych: Awake and oriented X 3, normal affect, Insight and Judgment appropriate.    Assessment:  Michele Morales was seen today for hospitalization follow-up.  Diagnoses and all orders for this visit:  Encounter to establish care Establish care with PCP  Hospital discharge follow-up Retrieve from hosp d/c Go to [facility] (Family Medicine) on 02/27/2021; at 10:50am for hospital follow-up- done  Schedule an appointment with Michele Mutton, MD (Infectious Diseases) on 01/31/2021; 2:00 PM. Follow-up on cellulitis. Kept appt placed on antibiotic- Augmentin and Doxycyline - sent in diflucan   Diabetes mellitus without complication ([place])  Goal of therapy: Less than 6.5 hemoglobin A1c.  Decrease, foods that are high in carbohydrates are the following rice, potatoes, breads, sugars, and pastas.  Reduction in the intake (eating) will assist in lowering your blood sugars.  -     metFORMIN (GLUCOPHAGE) 1000 MG tablet; Take 1 tablet (1,000 mg total) by mouth 2 (  two) times daily with a meal. -     glipiZIDE (GLUCOTROL) 10 MG tablet; Take 1 tablet (10 mg total) by mouth 2 (two) times daily before a meal. -     Lipid Panel -     lisinopril (ZESTRIL) 2.5 MG tablet; Take 1 tablet (2.5 mg total) by mouth daily. -     Insulin Glargine (BASAGLAR KWIKPEN) 100 UNIT/ML; Inject 15 Units into the skin 2 (two) times  daily.  Language barrier Spanish   Lipid screening -     Lipid Panel  Review previous encounters  This note has been created with Surveyor, quantity. Any transcriptional errors are unintentional.   Michele Perna, NP 02/27/2021, 10:00 AM

## 2021-02-28 ENCOUNTER — Other Ambulatory Visit (INDEPENDENT_AMBULATORY_CARE_PROVIDER_SITE_OTHER): Payer: Self-pay | Admitting: Primary Care

## 2021-02-28 LAB — LIPID PANEL
Chol/HDL Ratio: 3.7 ratio (ref 0.0–4.4)
Cholesterol, Total: 198 mg/dL (ref 100–199)
HDL: 53 mg/dL (ref >39)
LDL Chol Calc (NIH): 118 mg/dL — ABNORMAL HIGH (ref 0–99)
Triglycerides: 152 mg/dL — ABNORMAL HIGH (ref 0–149)
VLDL Cholesterol Cal: 27 mg/dL (ref 5–40)

## 2021-02-28 MED ORDER — ROSUVASTATIN CALCIUM 10 MG PO TABS: 10.0000 mg | ORAL_TABLET | Freq: Every day | ORAL | 1 refills | Status: DC

## 2021-03-01 ENCOUNTER — Ambulatory Visit: Payer: Self-pay | Admitting: Podiatry

## 2021-03-15 ENCOUNTER — Ambulatory Visit: Payer: Self-pay | Admitting: Internal Medicine

## 2021-03-15 ENCOUNTER — Ambulatory Visit: Payer: Self-pay | Admitting: Podiatry

## 2021-04-02 ENCOUNTER — Ambulatory Visit (INDEPENDENT_AMBULATORY_CARE_PROVIDER_SITE_OTHER): Payer: Self-pay | Admitting: Nurse Practitioner

## 2021-04-02 ENCOUNTER — Encounter (INDEPENDENT_AMBULATORY_CARE_PROVIDER_SITE_OTHER): Payer: Self-pay | Admitting: Nurse Practitioner

## 2021-04-02 ENCOUNTER — Other Ambulatory Visit: Payer: Self-pay

## 2021-04-02 VITALS — BP 147/87 | HR 105 | Temp 97.5°F | Ht 61.0 in | Wt 138.8 lb

## 2021-04-02 DIAGNOSIS — E119 Type 2 diabetes mellitus without complications: Secondary | ICD-10-CM

## 2021-04-02 DIAGNOSIS — Z23 Encounter for immunization: Secondary | ICD-10-CM

## 2021-04-02 LAB — POCT GLYCOSYLATED HEMOGLOBIN (HGB A1C): Hemoglobin A1C: 9.8 % — AB (ref 4.0–5.6)

## 2021-04-02 MED ORDER — LISINOPRIL 2.5 MG PO TABS
2.5000 mg | ORAL_TABLET | Freq: Every day | ORAL | 1 refills | Status: DC
  Filled 2021-04-02: qty 30, 30d supply, fill #0
  Filled 2021-05-03: qty 30, 30d supply, fill #1

## 2021-04-02 MED ORDER — METFORMIN HCL 1000 MG PO TABS
1000.0000 mg | ORAL_TABLET | Freq: Two times a day (BID) | ORAL | 3 refills | Status: DC
  Filled 2021-04-02: qty 60, 30d supply, fill #0
  Filled 2021-05-03: qty 60, 30d supply, fill #1
  Filled 2021-07-24: qty 60, 30d supply, fill #2

## 2021-04-02 MED ORDER — BASAGLAR KWIKPEN 100 UNIT/ML ~~LOC~~ SOPN
15.0000 [IU] | PEN_INJECTOR | Freq: Two times a day (BID) | SUBCUTANEOUS | 3 refills | Status: DC
  Filled 2021-04-02: qty 9, 30d supply, fill #0

## 2021-04-02 MED ORDER — GLIPIZIDE 10 MG PO TABS
10.0000 mg | ORAL_TABLET | Freq: Two times a day (BID) | ORAL | 1 refills | Status: DC
  Filled 2021-04-02: qty 60, 30d supply, fill #0
  Filled 2021-05-03: qty 60, 30d supply, fill #1
  Filled 2021-07-24: qty 60, 30d supply, fill #2

## 2021-04-02 NOTE — Patient Instructions (Addendum)
Diabetes:  HGB A1C is trending down  Continue current medications:  -  metFORMIN (GLUCOPHAGE) 1000 MG tablet; Take 1 tablet (1,000 mg total) by mouth 2 (two) times daily with a meal. -     glipiZIDE (GLUCOTROL) 10 MG tablet; Take 1 tablet (10 mg total) by mouth 2 (two) times daily before a meal. -     Lipid Panel -     lisinopril (ZESTRIL) 2.5 MG tablet; Take 1 tablet (2.5 mg total) by mouth daily. -     Insulin Glargine (BASAGLAR KWIKPEN) 100 UNIT/ML; Inject 15 Units into the skin 2 (two) times daily.  Stay active - increase exercise  Diabetic diet  Diabetes Mellitus and Nutrition, Adult When you have diabetes, or diabetes mellitus, it is very important to have healthy eating habits because your blood sugar (glucose) levels are greatly affected by what you eat and drink. Eating healthy foods in the right amounts, at about the same times every day, can help you: Control your blood glucose. Lower your risk of heart disease. Improve your blood pressure. Reach or maintain a healthy weight. What can affect my meal plan? Every person with diabetes is different, and each person has different needs for a meal plan. Your health care provider may recommend that you work with a dietitian to make a meal plan that is best for you. Your meal plan may vary depending on factors such as: The calories you need. The medicines you take. Your weight. Your blood glucose, blood pressure, and cholesterol levels. Your activity level. Other health conditions you have, such as heart or kidney disease. How do carbohydrates affect me? Carbohydrates, also called carbs, affect your blood glucose level more than any other type of food. Eating carbs naturally raises the amount of glucose in your blood. Carb counting is a method for keeping track of how many carbs you eat. Counting carbs is important to keep your blood glucose at a healthy level,especially if you use insulin or take certain oral diabetes  medicines. It is important to know how many carbs you can safely have in each meal. This is different for every person. Your dietitian can help you calculate how manycarbs you should have at each meal and for each snack. How does alcohol affect me? Alcohol can cause a sudden decrease in blood glucose (hypoglycemia), especially if you use insulin or take certain oral diabetes medicines. Hypoglycemia can be a life-threatening condition. Symptoms of hypoglycemia, such as sleepiness, dizziness, and confusion, are similar to symptoms of having too much alcohol. Do not drink alcohol if: Your health care provider tells you not to drink. You are pregnant, may be pregnant, or are planning to become pregnant. If you drink alcohol: Do not drink on an empty stomach. Limit how much you use to: 0-1 drink a day for women. 0-2 drinks a day for men. Be aware of how much alcohol is in your drink. In the [place] one drink equals one 12 oz bottle of beer (355 mL), one 5 oz glass of wine (148 mL), or one 1 oz glass of hard liquor (44 mL). Keep yourself hydrated with water, diet soda, or unsweetened iced tea. Keep in mind that regular soda, juice, and other mixers may contain a lot of sugar and must be counted as carbs. What are tips for following this plan?  Reading food labels Start by checking the serving size on the "Nutrition Facts" label of packaged foods and drinks. The amount of calories, carbs, fats, and other nutrients  listed on the label is based on one serving of the item. Many items contain more than one serving per package. Check the total grams (g) of carbs in one serving. You can calculate the number of servings of carbs in one serving by dividing the total carbs by 15. For example, if a food has 30 g of total carbs per serving, it would be equal to 2 servings of carbs. Check the number of grams (g) of saturated fats and trans fats in one serving. Choose foods that have a low amount or none of these  fats. Check the number of milligrams (mg) of salt (sodium) in one serving. Most people should limit total sodium intake to less than 2,300 mg per day. Always check the nutrition information of foods labeled as "low-fat" or "nonfat." These foods may be higher in added sugar or refined carbs and should be avoided. Talk to your dietitian to identify your daily goals for nutrients listed on the label. Shopping Avoid buying canned, pre-made, or processed foods. These foods tend to be high in fat, sodium, and added sugar. Shop around the outside edge of the grocery store. This is where you will most often find fresh fruits and vegetables, bulk grains, fresh meats, and fresh dairy. Cooking Use low-heat cooking methods, such as baking, instead of high-heat cooking methods like deep frying. Cook using healthy oils, such as olive, canola, or sunflower oil. Avoid cooking with butter, cream, or high-fat meats. Meal planning Eat meals and snacks regularly, preferably at the same times every day. Avoid going long periods of time without eating. Eat foods that are high in fiber, such as fresh fruits, vegetables, beans, and whole grains. Talk with your dietitian about how many servings of carbs you can eat at each meal. Eat 4-6 oz (112-168 g) of lean protein each day, such as lean meat, chicken, fish, eggs, or tofu. One ounce (oz) of lean protein is equal to: 1 oz (28 g) of meat, chicken, or fish. 1 egg.  cup (62 g) of tofu. Eat some foods each day that contain healthy fats, such as avocado, nuts, seeds, and fish. What foods should I eat? Fruits Berries. Apples. Oranges. Peaches. Apricots. Plums. Grapes. Mango. Papaya.Pomegranate. Kiwi. Cherries. Vegetables Lettuce. Spinach. Leafy greens, including kale, chard, collard greens, and mustard greens. Beets. Cauliflower. Cabbage. Broccoli. Carrots. Green beans.Tomatoes. Peppers. Onions. Cucumbers. Brussels sprouts. Grains Whole grains, such as whole-wheat or  whole-grain bread, crackers, tortillas,cereal, and pasta. Unsweetened oatmeal. Quinoa. Brown or wild rice. Meats and other proteins Seafood. Poultry without skin. Lean cuts of poultry and beef. Tofu. Nuts. Seeds. Dairy Low-fat or fat-free dairy products such as milk, yogurt, and cheese. The items listed above may not be a complete list of foods and beverages you can eat. Contact a dietitian for more information. What foods should I avoid? Fruits Fruits canned with syrup. Vegetables Canned vegetables. Frozen vegetables with butter or cream sauce. Grains Refined white flour and flour products such as bread, pasta, snack foods, andcereals. Avoid all processed foods. Meats and other proteins Fatty cuts of meat. Poultry with skin. Breaded or fried meats. Processed meat.Avoid saturated fats. Dairy Full-fat yogurt, cheese, or milk. Beverages Sweetened drinks, such as soda or iced tea. The items listed above may not be a complete list of foods and beverages you should avoid. Contact a dietitian for more information. Questions to ask a health care provider Do I need to meet with a diabetes educator? Do I need to meet with a dietitian?  What number can I call if I have questions? When are the best times to check my blood glucose? Where to find more information: American Diabetes Association: diabetes.org Academy of Nutrition and Dietetics: www.eatright.[employer] of Diabetes and Digestive and Kidney Diseases: CarFlippers.tn Association of Diabetes Care and Education Specialists: www.diabeteseducator.org Summary It is important to have healthy eating habits because your blood sugar (glucose) levels are greatly affected by what you eat and drink. A healthy meal plan will help you control your blood glucose and maintain a healthy lifestyle. Your health care provider may recommend that you work with a dietitian to make a meal plan that is best for you. Keep in mind that  carbohydrates (carbs) and alcohol have immediate effects on your blood glucose levels. It is important to count carbs and to use alcohol carefully. This information is not intended to replace advice given to you by your health care provider. Make sure you discuss any questions you have with your healthcare provider. Document Revised: 07/19/2019 Document Reviewed: 07/19/2019 Elsevier Patient Education  2021 [employer].

## 2021-04-02 NOTE — Progress Notes (Signed)
Subjective:     Michele Morales is a 45 y.o. female who presents for follow up of diabetes.. Current symptoms include: none. Patient denies hypoglycemia , increased appetite, nausea, polydipsia, polyuria, and vomiting. Evaluation to date has been: fasting blood sugar, fasting lipid panel, hemoglobin A1C, and microalbuminuria. Home sugars: BGs range between 120 and 160 . Current treatments: increased aerobic exercise which has been effective. Last dilated eye exam 6/22.    Review of Systems Review of Systems  Constitutional: Negative.   HENT: Negative.    Eyes: Negative.   Respiratory: Negative.    Cardiovascular: Negative.   Gastrointestinal: Negative.   Genitourinary: Negative.   Musculoskeletal: Negative.   Skin: Negative.   Neurological: Negative.   Endo/Heme/Allergies: Negative.   Psychiatric/Behavioral: Negative.       Objective:    Physical Exam Constitutional:      General: She is not in acute distress. Cardiovascular:     Rate and Rhythm: Normal rate and regular rhythm.  Pulmonary:     Effort: Pulmonary effort is normal.     Breath sounds: Normal breath sounds.  Musculoskeletal:     Right lower leg: No edema.     Left lower leg: No edema.  Skin:    General: Skin is warm and dry.  Neurological:     Mental Status: She is alert and oriented to person, place, and time.  Psychiatric:        Mood and Affect: Affect normal.     Laboratory: No components found for: A1C    Assessment:    Diabetes mellitus Type II, under fair control.    Plan:    Discussed general issues about diabetes pathophysiology and management. Agricultural engineer distributed. Addressed ADA diet. Suggested low cholesterol diet. Encouraged aerobic exercise. Discussed foot care. Reminded to get yearly retinal exam. Reminded to bring in blood sugar diary at next visit. Follow up in 3 months or as needed.  Patient Instructions  Diabetes:  HGB A1C is trending down  Continue current  medications:  -  metFORMIN (GLUCOPHAGE) 1000 MG tablet; Take 1 tablet (1,000 mg total) by mouth 2 (two) times daily with a meal. -     glipiZIDE (GLUCOTROL) 10 MG tablet; Take 1 tablet (10 mg total) by mouth 2 (two) times daily before a meal. -     Lipid Panel -     lisinopril (ZESTRIL) 2.5 MG tablet; Take 1 tablet (2.5 mg total) by mouth daily. -     Insulin Glargine (BASAGLAR KWIKPEN) 100 UNIT/ML; Inject 15 Units into the skin 2 (two) times daily.  Stay active - increase exercise  Diabetic diet  Diabetes Mellitus and Nutrition, Adult When you have diabetes, or diabetes mellitus, it is very important to have healthy eating habits because your blood sugar (glucose) levels are greatly affected by what you eat and drink. Eating healthy foods in the right amounts, at about the same times every day, can help you: Control your blood glucose. Lower your risk of heart disease. Improve your blood pressure. Reach or maintain a healthy weight. What can affect my meal plan? Every person with diabetes is different, and each person has different needs for a meal plan. Your health care provider may recommend that you work with a dietitian to make a meal plan that is best for you. Your meal plan may vary depending on factors such as: The calories you need. The medicines you take. Your weight. Your blood glucose, blood pressure, and cholesterol levels. Your activity level. Other  health conditions you have, such as heart or kidney disease. How do carbohydrates affect me? Carbohydrates, also called carbs, affect your blood glucose level more than any other type of food. Eating carbs naturally raises the amount of glucose in your blood. Carb counting is a method for keeping track of how many carbs you eat. Counting carbs is important to keep your blood glucose at a healthy level,especially if you use insulin or take certain oral diabetes medicines. It is important to know how many carbs you can safely have  in each meal. This is different for every person. Your dietitian can help you calculate how manycarbs you should have at each meal and for each snack. How does alcohol affect me? Alcohol can cause a sudden decrease in blood glucose (hypoglycemia), especially if you use insulin or take certain oral diabetes medicines. Hypoglycemia can be a life-threatening condition. Symptoms of hypoglycemia, such as sleepiness, dizziness, and confusion, are similar to symptoms of having too much alcohol. Do not drink alcohol if: Your health care provider tells you not to drink. You are pregnant, may be pregnant, or are planning to become pregnant. If you drink alcohol: Do not drink on an empty stomach. Limit how much you use to: 0-1 drink a day for women. 0-2 drinks a day for men. Be aware of how much alcohol is in your drink. In the [place] one drink equals one 12 oz bottle of beer (355 mL), one 5 oz glass of wine (148 mL), or one 1 oz glass of hard liquor (44 mL). Keep yourself hydrated with water, diet soda, or unsweetened iced tea. Keep in mind that regular soda, juice, and other mixers may contain a lot of sugar and must be counted as carbs. What are tips for following this plan?  Reading food labels Start by checking the serving size on the "Nutrition Facts" label of packaged foods and drinks. The amount of calories, carbs, fats, and other nutrients listed on the label is based on one serving of the item. Many items contain more than one serving per package. Check the total grams (g) of carbs in one serving. You can calculate the number of servings of carbs in one serving by dividing the total carbs by 15. For example, if a food has 30 g of total carbs per serving, it would be equal to 2 servings of carbs. Check the number of grams (g) of saturated fats and trans fats in one serving. Choose foods that have a low amount or none of these fats. Check the number of milligrams (mg) of salt (sodium) in one  serving. Most people should limit total sodium intake to less than 2,300 mg per day. Always check the nutrition information of foods labeled as "low-fat" or "nonfat." These foods may be higher in added sugar or refined carbs and should be avoided. Talk to your dietitian to identify your daily goals for nutrients listed on the label. Shopping Avoid buying canned, pre-made, or processed foods. These foods tend to be high in fat, sodium, and added sugar. Shop around the outside edge of the grocery store. This is where you will most often find fresh fruits and vegetables, bulk grains, fresh meats, and fresh dairy. Cooking Use low-heat cooking methods, such as baking, instead of high-heat cooking methods like deep frying. Cook using healthy oils, such as olive, canola, or sunflower oil. Avoid cooking with butter, cream, or high-fat meats. Meal planning Eat meals and snacks regularly, preferably at the same times every day.  Avoid going long periods of time without eating. Eat foods that are high in fiber, such as fresh fruits, vegetables, beans, and whole grains. Talk with your dietitian about how many servings of carbs you can eat at each meal. Eat 4-6 oz (112-168 g) of lean protein each day, such as lean meat, chicken, fish, eggs, or tofu. One ounce (oz) of lean protein is equal to: 1 oz (28 g) of meat, chicken, or fish. 1 egg.  cup (62 g) of tofu. Eat some foods each day that contain healthy fats, such as avocado, nuts, seeds, and fish. What foods should I eat? Fruits Berries. Apples. Oranges. Peaches. Apricots. Plums. Grapes. Mango. Papaya.Pomegranate. Kiwi. Cherries. Vegetables Lettuce. Spinach. Leafy greens, including kale, chard, collard greens, and mustard greens. Beets. Cauliflower. Cabbage. Broccoli. Carrots. Green beans.Tomatoes. Peppers. Onions. Cucumbers. Brussels sprouts. Grains Whole grains, such as whole-wheat or whole-grain bread, crackers, tortillas,cereal, and pasta. Unsweetened  oatmeal. Quinoa. Brown or wild rice. Meats and other proteins Seafood. Poultry without skin. Lean cuts of poultry and beef. Tofu. Nuts. Seeds. Dairy Low-fat or fat-free dairy products such as milk, yogurt, and cheese. The items listed above may not be a complete list of foods and beverages you can eat. Contact a dietitian for more information. What foods should I avoid? Fruits Fruits canned with syrup. Vegetables Canned vegetables. Frozen vegetables with butter or cream sauce. Grains Refined white flour and flour products such as bread, pasta, snack foods, andcereals. Avoid all processed foods. Meats and other proteins Fatty cuts of meat. Poultry with skin. Breaded or fried meats. Processed meat.Avoid saturated fats. Dairy Full-fat yogurt, cheese, or milk. Beverages Sweetened drinks, such as soda or iced tea. The items listed above may not be a complete list of foods and beverages you should avoid. Contact a dietitian for more information. Questions to ask a health care provider Do I need to meet with a diabetes educator? Do I need to meet with a dietitian? What number can I call if I have questions? When are the best times to check my blood glucose? Where to find more information: American Diabetes Association: diabetes.org Academy of Nutrition and Dietetics: www.eatright.[employer] of Diabetes and Digestive and Kidney Diseases: CarFlippers.tn Association of Diabetes Care and Education Specialists: www.diabeteseducator.org Summary It is important to have healthy eating habits because your blood sugar (glucose) levels are greatly affected by what you eat and drink. A healthy meal plan will help you control your blood glucose and maintain a healthy lifestyle. Your health care provider may recommend that you work with a dietitian to make a meal plan that is best for you. Keep in mind that carbohydrates (carbs) and alcohol have immediate effects on your blood glucose  levels. It is important to count carbs and to use alcohol carefully. This information is not intended to replace advice given to you by your health care provider. Make sure you discuss any questions you have with your healthcare provider. Document Revised: 07/19/2019 Document Reviewed: 07/19/2019 Elsevier Patient Education  2021 [employer].

## 2021-04-05 ENCOUNTER — Ambulatory Visit: Payer: Self-pay | Admitting: Internal Medicine

## 2021-05-03 ENCOUNTER — Other Ambulatory Visit: Payer: Self-pay

## 2021-05-06 ENCOUNTER — Other Ambulatory Visit: Payer: Self-pay

## 2021-07-05 ENCOUNTER — Ambulatory Visit (INDEPENDENT_AMBULATORY_CARE_PROVIDER_SITE_OTHER): Payer: Self-pay | Admitting: Primary Care

## 2021-07-24 ENCOUNTER — Other Ambulatory Visit: Payer: Self-pay

## 2021-12-12 ENCOUNTER — Ambulatory Visit (INDEPENDENT_AMBULATORY_CARE_PROVIDER_SITE_OTHER): Payer: Self-pay | Admitting: Primary Care

## 2021-12-12 ENCOUNTER — Encounter (INDEPENDENT_AMBULATORY_CARE_PROVIDER_SITE_OTHER): Payer: Self-pay | Admitting: Primary Care

## 2021-12-12 ENCOUNTER — Other Ambulatory Visit: Payer: Self-pay

## 2021-12-12 VITALS — BP 147/82 | HR 98 | Temp 97.9°F | Ht 61.0 in | Wt 123.8 lb

## 2021-12-12 DIAGNOSIS — I10 Essential (primary) hypertension: Secondary | ICD-10-CM

## 2021-12-12 DIAGNOSIS — Z789 Other specified health status: Secondary | ICD-10-CM

## 2021-12-12 DIAGNOSIS — E119 Type 2 diabetes mellitus without complications: Secondary | ICD-10-CM

## 2021-12-12 DIAGNOSIS — Z794 Long term (current) use of insulin: Secondary | ICD-10-CM

## 2021-12-12 DIAGNOSIS — E0840 Diabetes mellitus due to underlying condition with diabetic neuropathy, unspecified: Secondary | ICD-10-CM

## 2021-12-12 LAB — POCT GLYCOSYLATED HEMOGLOBIN (HGB A1C): Hemoglobin A1C: 14.8 % — AB (ref 4.0–5.6)

## 2021-12-12 MED ORDER — LISINOPRIL 10 MG PO TABS
10.0000 mg | ORAL_TABLET | Freq: Every day | ORAL | 1 refills | Status: DC
  Filled 2021-12-12: qty 30, 30d supply, fill #0
  Filled 2022-01-10: qty 30, 30d supply, fill #1
  Filled 2022-04-08: qty 30, 30d supply, fill #2
  Filled 2022-05-08: qty 30, 30d supply, fill #3
  Filled 2022-06-09: qty 30, 30d supply, fill #4
  Filled 2022-07-09: qty 30, 30d supply, fill #5

## 2021-12-12 MED ORDER — GLIPIZIDE 10 MG PO TABS
10.0000 mg | ORAL_TABLET | Freq: Two times a day (BID) | ORAL | 1 refills | Status: DC
  Filled 2021-12-12: qty 60, 30d supply, fill #0

## 2021-12-12 MED ORDER — METFORMIN HCL 1000 MG PO TABS
1000.0000 mg | ORAL_TABLET | Freq: Two times a day (BID) | ORAL | 1 refills | Status: DC
  Filled 2021-12-12: qty 60, 30d supply, fill #0
  Filled 2022-01-10: qty 60, 30d supply, fill #1
  Filled 2022-04-08: qty 60, 30d supply, fill #2
  Filled 2022-05-08: qty 60, 30d supply, fill #3
  Filled 2022-06-11 – 2022-07-10 (×2): qty 60, 30d supply, fill #4
  Filled 2022-08-12: qty 60, 30d supply, fill #5

## 2021-12-12 MED ORDER — BASAGLAR KWIKPEN 100 UNIT/ML ~~LOC~~ SOPN
10.0000 [IU] | PEN_INJECTOR | Freq: Two times a day (BID) | SUBCUTANEOUS | 3 refills | Status: DC
  Filled 2021-12-12: qty 3, 15d supply, fill #0

## 2021-12-12 MED ORDER — ROSUVASTATIN CALCIUM 10 MG PO TABS
10.0000 mg | ORAL_TABLET | Freq: Every day | ORAL | 1 refills | Status: DC
  Filled 2021-12-12: qty 30, 30d supply, fill #0
  Filled 2022-01-10: qty 30, 30d supply, fill #1
  Filled 2022-04-08: qty 30, 30d supply, fill #2
  Filled 2022-05-08: qty 30, 30d supply, fill #3
  Filled 2022-06-09: qty 30, 30d supply, fill #4
  Filled 2022-07-09: qty 30, 30d supply, fill #5

## 2021-12-12 NOTE — Patient Instructions (Signed)
Diabetes mellitus y nutrici?n, en adultos ?Diabetes Mellitus and Nutrition, Adult ?Si sufre de diabetes, o diabetes mellitus, es muy importante tener h?bitos alimenticios saludables debido a que sus niveles de az?car en la sangre (glucosa) se ven afectados en gran medida por lo que come y bebe. [place] alimentos saludables en las cantidades correctas, aproximadamente a la misma hora todos los d?as, lo ayudar? a: ?Controlar su glucemia. ?Disminuir el riesgo de sufrir una enfermedad card?aca. ?Mejorar la presi?n arterial. ?Alcanzar o mantener un peso saludable. ??Qu? puede afectar mi plan de alimentaci?n? ?Todas las personas que sufren de diabetes son diferentes y cada una tiene necesidades diferentes en cuanto a un plan de alimentaci?n. El m?dico puede recomendarle que trabaje con un nutricionista para elaborar el mejor plan para usted. Su plan de alimentaci?n puede variar seg?n factores como: ?Las calor?as que necesita. ?Los medicamentos que toma. ?Su peso. ?Sus niveles de glucemia, presi?n arterial y colesterol. ?Su nivel de actividad. ?Otras afecciones que tenga, como enfermedades card?acas o renales. ??C?mo me afectan los carbohidratos? ?Los carbohidratos, o hidratos de carbono, afectan su nivel de glucemia m?s que cualquier otro tipo de alimento. La ingesta de carbohidratos aumenta la cantidad de glucosa en la sangre. ?Es importante conocer la cantidad de carbohidratos que se pueden ingerir en cada comida sin correr ning?n riesgo. Esto es diferente en cada persona. Su nutricionista puede ayudarlo a calcular la cantidad de carbohidratos que debe ingerir en cada comida y en cada refrigerio. ??C?mo me afecta el alcohol? ?El alcohol puede provocar una disminuci?n de la glucemia (hipoglucemia), especialmente si usa insulina o toma determinados medicamentos por v?a oral para la diabetes. La hipoglucemia es una afecci?n potencialmente mortal. Los s?ntomas de la hipoglucemia, como somnolencia, mareos y confusi?n, son  similares a los s?ntomas de haber consumido demasiado alcohol. ?No beba alcohol si: ?Su m?dico le indica no hacerlo. ?Est? embarazada, puede estar embarazada o est? tratando de quedar embarazada. ?Si bebe alcohol: ?Limite la cantidad que bebe a lo siguiente: ?De 0 a 1 medida por d?a para las mujeres. ?De 0 a 2 medidas por d?a para los hombres. ?Sepa cu?nta cantidad de alcohol hay en las bebidas que toma. En [place], una medida equivale a una botella de cerveza de 12 oz (355 ml), un vaso de vino de 5 oz (148 ml) o un vaso de una bebida alcoh?lica de alta graduaci?n de 1? oz (44 ml). ?Mant?ngase hidratado bebiendo agua, refrescos diet?ticos o t? helado sin az?car. Tenga en cuenta que los refrescos comunes, los jugos y otras bebidas para mezclar pueden contener mucha az?car y se deben contar como carbohidratos. ?Consejos para seguir este plan ? ?Leer las etiquetas de los alimentos ?Comience por leer el tama?o de la porci?n en la etiqueta de Informaci?n nutricional de los alimentos envasados y las bebidas. La cantidad de calor?as, carbohidratos, grasas y otros nutrientes detallados en la etiqueta se basan en una porci?n del alimento. Muchos alimentos contienen m?s de una porci?n por envase. ?Verifique la cantidad total de gramos (g) de carbohidratos totales en una porci?n. ?Verifique la cantidad de gramos de grasas saturadas y grasas trans en una porci?n. Escoja alimentos que no contengan estas grasas o que su contenido de estas sea bajo. ?Verifique la cantidad de miligramos (mg) de sal (sodio) en una porci?n. La mayor?a de las personas deben limitar la ingesta de sodio total a menos de 2300 mg por d?a. ?Siempre consulte la informaci?n nutricional de los alimentos etiquetados como ?con bajo contenido de grasa? o ?sin grasa?.   Estos alimentos pueden tener un mayor contenido de az?car agregada o carbohidratos refinados, y deben evitarse. ?Hable con su nutricionista para identificar sus objetivos diarios en  cuanto a los nutrientes mencionados en la etiqueta. ?Al ir de compras ?Evite comprar alimentos procesados, enlatados o precocidos. Estos alimentos tienden a tener una mayor cantidad de grasa, sodio y az?car agregada. ?Compre en la zona exterior de la tienda de comestibles. Esta es la zona donde se encuentran con mayor frecuencia las frutas y las verduras frescas, los cereales a granel, las carnes frescas y los productos l?cteos frescos. ?Al cocinar ?Use m?todos de cocci?n a baja temperatura, como hornear, en lugar de m?todos de cocci?n a alta temperatura, como fre?r en abundante aceite. ?Cocine con aceites saludables, como el aceite de oliva, canola o girasol. ?Evite cocinar con manteca, crema o carnes con alto contenido de grasa. ?Planificaci?n de las comidas ?Coma las comidas y los refrigerios regularmente, preferentemente a la misma hora todos los d?as. Evite pasar largos per?odos de tiempo sin comer. ?Consuma alimentos ricos en fibra, como frutas frescas, verduras, frijoles y cereales integrales. ?Consuma entre 4 y 6 onzas (entre 112 y 168 g) de prote?nas magras por d?a, como carnes magras, pollo, pescado, huevos o tofu. Una onza (oz) (28 g) de prote?na magra equivale a: ?1 onza (28 g) de carne, pollo o pescado. ?1 huevo. ?? taza (62 g) de tofu. ?Coma algunos alimentos por d?a que contengan grasas saludables, como aguacates, frutos secos, semillas y pescado. ??Qu? alimentos debo comer? ?Frutas ?Bayas. Manzanas. Naranjas. Duraznos. Damascos. Ciruelas. Uvas. Mangos. Papayas. Granadas. Kiwi. Cerezas. ?Verduras ?Verduras de hoja verde, que incluyen lechuga, espinaca, col rizada, acelga, hojas de berza, hojas de mostaza y repollo. Remolachas. Coliflor. Br?coli. Zanahorias. Jud?as verdes. Tomates. Pimientos. Cebollas. Pepinos. Coles de Bruselas. ?Granos ?Granos integrales, como panes, galletas, tortillas, cereales y pastas de salvado o integrales. Avena sin az?car. Quinua. Arroz integral o salvaje. ?Carnes y otras  prote?nas ?Frutos de mar. Carne de ave sin piel. Cortes magros de ave y carne de res. Tofu. Frutos secos. Semillas. ?L?cteos ?Productos l?cteos sin grasa o con bajo contenido de grasa, como leche, yogur y queso. ?Es posible que los productos detallados arriba no constituyan una lista completa de los alimentos y las bebidas que puede tomar. Consulte a un nutricionista para obtener m?s informaci?n. ??Qu? alimentos debo evitar? ?Frutas ?Frutas enlatadas al alm?bar. ?Verduras ?Verduras enlatadas. Verduras congeladas con mantequilla o salsa de crema. ?Granos ?Productos elaborados con harina y harina blanca refinada, como panes, pastas, bocadillos y cereales. Evite todos los alimentos procesados. ?Carnes y otras prote?nas ?Cortes de carne con alto contenido de grasa. Carne de ave con piel. Carnes empanizadas o fritas. Carne procesada. Evite las grasas saturadas. ?L?cteos ?Yogur, queso o leche enteros. ?Bebidas ?Bebidas azucaradas, como gaseosas o t? helado. ?Es posible que los productos que se enumeran m?s arriba no constituyan una lista completa de los alimentos y las bebidas que debe evitar. Consulte a un nutricionista para obtener m?s informaci?n. ?Preguntas para hacerle al m?dico ??Debo consultar con un especialista certificado en atenci?n y educaci?n sobre la diabetes? ??Es necesario que me re?na con un nutricionista? ??A qu? n?mero puedo llamar si tengo preguntas? ??Cu?les son los mejores momentos para controlar la glucemia? ?D?nde encontrar m?s informaci?n: ?American Diabetes Association (Asociaci?n [place]ense de la Diabetes): diabetes.org ?Academy of Nutrition and Dietetics (Academia de Nutrici?n y Diet?tica): eatright.org ?National Institute of Diabetes and Digestive and Kidney Diseases (Instituto Nacional de la Diabetes y las Enfermedades Digestivas y Renales): niddk.nih.gov ?Association of Diabetes   Care & Education Specialists (Asociaci?n de Especialistas en Atenci?n y Educaci?n sobre la Diabetes):  diabeteseducator.org ?Resumen ?Es importante tener h?bitos alimenticios saludables debido a que sus niveles de az?car en la sangre (glucosa) se ven afectados en gran medida por lo que come y bebe. Es importante consumir

## 2021-12-12 NOTE — Progress Notes (Signed)
?Michele Morales ? ?Michele Morales, is a 46 y.o. female ? ?[id number] ? ?[id number] ? ?DOB - September 13, 1975 ? ?Chief Complaint  ?Patient presents with  ? Diabetes  ? Medication Refill  ?    ? ?Subjective:  ? ?Michele Morales is a 46 y.o. 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 here today for a follow up visit. Patient has No headache, No chest pain, No abdominal pain - No Nausea, No new weakness tingling or numbness, No Cough - shortness of breath. Denies polyuria,and polydipsia. She has been experience numbness bottom of feet and vision changes. ? ?No problems updated. ? ?No Known Allergies ? ?Past Medical History:  ?Diagnosis Date  ? Diabetes mellitus without complication ([place])   ? IDDM  ? Hypercholesteremia   ? ? ?Current Outpatient Medications on File Prior to Visit  ?Medication Sig Dispense Refill  ? Accu-Chek Softclix Lancets lancets Use as directed. (Patient not taking: Reported on 12/12/2021) 100 each 5  ? Blood Glucose Monitoring Suppl (ACCU-CHEK GUIDE) w/Device KIT Use as directed (Patient not taking: Reported on 12/12/2021) 1 kit 0  ? fluconazole (DIFLUCAN) 150 MG tablet Take 1 tablet (150 mg total) by mouth daily. (Patient not taking: Reported on 12/12/2021) 2 tablet 0  ? glucose blood (ACCU-CHEK GUIDE) test strip Use as instructed (Patient not taking: Reported on 12/12/2021) 100 each 12  ? Insulin Pen Needle (PENTIPS) 32G X 4 MM MISC use as directed (Patient not taking: Reported on 12/12/2021) 200 each 3  ? ?No current facility-administered medications on file prior to visit.  ?Comprehensive ROS Pertinent positive and negative noted in HPI   ? ?Objective:  ? ?Vitals:  ? 12/12/21 1457  ?BP: (!) 147/82  ?Pulse: 98  ?Temp: 97.9 ?F (36.6 ?C)  ?TempSrc: Oral  ?SpO2: 98%  ?Weight: 123 lb 12.8 oz (56.2 kg)  ?Height: 5' 1"  (1.549 m)  ? ? ?Exam ?General appearance : Awake, alert, not in any distress. Speech Clear. Not toxic looking ?HEENT: Atraumatic and Normocephalic,  pupils equally reactive to light and accomodation ?Neck: Supple, no JVD. No cervical lymphadenopathy.  ?Chest: Good air entry bilaterally, no added sounds  ?CVS: S1 S2 regular, no murmurs.  ?Abdomen: Bowel sounds present, Non tender and not distended with no gaurding, rigidity or rebound. ?Extremities: B/L Lower Ext shows no edema, both legs are warm to touch ?Neurology: Awake alert, and oriented X 3, CN II-XII intact, Non focal ?Skin: No Rash ? ?Data Review ?Lab Results  ?Component Value Date  ? HGBA1C 14.8 (A) 12/12/2021  ? HGBA1C 9.8 (A) 04/02/2021  ? HGBA1C 13.6 (H) 01/20/2021  ? ? ?Assessment & Plan  ?Michele Morales was seen today for diabetes and medication refill. ? ?Diagnoses and all orders for this visit: ? ?1. Diabetes mellitus without complication ([place]) ?- HgB A1c14.4  ?Discussed  co- morbidities with uncontrol diabetes  Complications -diabetic retinopathy, (close your eyes ? What do you see nothing) nephropathy decrease in kidney function- can lead to dialysis-on a machine 3 days a week to filter your kidney, neuropathy- numbness and tinging in your hands and feet,  increase risk of heart attack and stroke, and amputation due to decrease wound healing and circulation. Decrease your risk by taking medication daily as prescribed, monitor carbohydrates- foods that are high in carbohydrates are the following rice, potatoes, breads, sugars, and pastas.  Reduction in the intake (eating) will assist in lowering your blood sugars. Exercise daily at least 30 minutes daily.  ? ?Language barrier  ?Spanish  ? ?Diabetes  mellitus due to underlying condition with diabetic neuropathy, with long-term current use of insulin ([place]) ?Due to uncontrolled diabetes  ?  ?Patient have been counseled extensively about nutrition and exercise. Other issues discussed during this visit include: low cholesterol diet, weight control and daily exercise, foot care, annual eye examinations at Ophthalmology, importance of adherence with medications  and regular follow-up. We also discussed long term complications of uncontrolled diabetes and hypertension.  ? ?Return in about 3 months (around 03/13/2022) for DM/fasting. ? ?The patient was given clear instructions to go to ER or return to medical center if symptoms don't improve, worsen or new problems develop. The patient verbalized understanding. The patient was told to call to get lab results if they haven't heard anything in the next week.  ? ?This note has been created with Surveyor, quantity. Any transcriptional errors are unintentional.  ? ?Kerin Perna, NP ?12/15/2021, 9:17 PM  ?

## 2022-01-10 ENCOUNTER — Ambulatory Visit: Payer: Self-pay | Attending: Primary Care | Admitting: Pharmacist

## 2022-01-10 ENCOUNTER — Other Ambulatory Visit: Payer: Self-pay

## 2022-01-10 ENCOUNTER — Encounter: Payer: Self-pay | Admitting: Pharmacist

## 2022-01-10 DIAGNOSIS — E0840 Diabetes mellitus due to underlying condition with diabetic neuropathy, unspecified: Secondary | ICD-10-CM

## 2022-01-10 DIAGNOSIS — Z794 Long term (current) use of insulin: Secondary | ICD-10-CM

## 2022-01-10 DIAGNOSIS — E119 Type 2 diabetes mellitus without complications: Secondary | ICD-10-CM

## 2022-01-10 MED ORDER — BASAGLAR KWIKPEN 100 UNIT/ML ~~LOC~~ SOPN
20.0000 [IU] | PEN_INJECTOR | Freq: Every day | SUBCUTANEOUS | 2 refills | Status: DC
  Filled 2022-01-10: qty 6, 30d supply, fill #0
  Filled 2022-04-08: qty 6, 30d supply, fill #1
  Filled 2022-06-09: qty 6, 30d supply, fill #2

## 2022-01-10 MED ORDER — TRUE METRIX METER W/DEVICE KIT
PACK | 0 refills | Status: DC
  Filled 2022-01-10: qty 1, 33d supply, fill #0

## 2022-01-10 MED ORDER — TRUEPLUS LANCETS 28G MISC
2 refills | Status: DC
  Filled 2022-01-10: qty 100, 33d supply, fill #0
  Filled 2022-04-08: qty 100, 33d supply, fill #1
  Filled 2022-07-10: qty 100, 33d supply, fill #2

## 2022-01-10 MED ORDER — TRUE METRIX BLOOD GLUCOSE TEST VI STRP
ORAL_STRIP | 2 refills | Status: DC
  Filled 2022-01-10: qty 100, 33d supply, fill #0
  Filled 2022-04-08: qty 100, 33d supply, fill #1
  Filled 2022-07-10: qty 100, 33d supply, fill #2

## 2022-01-10 NOTE — Progress Notes (Signed)
S:     No chief complaint on file.  Michele Morales is a 46 y.o. female who presents for diabetes evaluation, education, and management. PMH is significant for T2DM. Patient was referred and last seen by Primary Care Provider, Gwinda Passe, on 12/12/21. A1c at that visit was 14.8%.  Today, patient arrives in good spirits and presents without assistance. Her daughter is with her today. She is uncertain when DM was dx. Review of her chart shows gestational DM but also appears to support a DM dx that predates gestation. She has a history of going without her medications, and her most recent hospital admission was 5/29-5/31/2022. She was admitted with cellulitis of her R foot , found to septic. Her A1c was 13.6% at the time of admission and was discharged on Novolin 70/30. She established care with Marcelino Duster on 02/27/2021 and was changed to insulin glargine. Her metformin was increased to 1000 mg BID, and she was also placed on glipizide 10 mg BID. She went without seeing her PCP until 12/12/2021 and her A1c was found to be 14.8%. Non-compliance was suspected and she was maintained on glargine insulin, glipizide and metformin.   Family/Social History:  Fhx: DM Tobacco: never smoker  Alcohol: none reported   Current diabetes medications include: Basaglar 10 units BID, glipizide 10 mg BID, metformin 1000 mg BID Current hypertension medications include: lisinopril 10 mg daily  Current hyperlipidemia medications include: rosuvastatin 10 mg daily   Patient reports taking all medications as prescribed. Patient reports missing her medications 0 times per week, on average. She feels like her current antihyperglycemic regimen is "too much for her".  Insurance coverage: self pay  Patient reports relative hypoglycemia. Does not check but reports almost daily symptoms of shaking, weakness, and nausea that occur later in the day.   Reported home fasting blood sugars: not checking  Reported 2 hour  post-meal/random blood sugars: not checking.  Patient denies nocturia (nighttime urination).  Patient reports neuropathy (nerve pain). Patient reports visual changes. Patient denies self foot exams.   Patient reported dietary habits:  -Admits to dietary indiscretion  - Eats ~4 tortillas with each meal  - Eats a culturally appropriate diet rich in rice, beans, corn - Does get a fair balance of fresh fruits and non-starchy vegetables per her daughter  Patient-reported exercise habits: Walks about 30 minutes to an hour each day of the week   O:  Physical Exam  ROS  7 day average blood glucose: no meter  Lab Results  Component Value Date   HGBA1C 14.8 (A) 12/12/2021   There were no vitals filed for this visit.  Lipid Panel     Component Value Date/Time   CHOL 198 02/27/2021 1057   TRIG 152 (H) 02/27/2021 1057   HDL 53 02/27/2021 1057   CHOLHDL 3.7 02/27/2021 1057   LDLCALC 118 (H) 02/27/2021 1057    Clinical Atherosclerotic Cardiovascular Disease (ASCVD): No  The 10-year ASCVD risk score (Arnett DK, et al., 2019) is: 2.9%   Values used to calculate the score:     Age: 28 years     Sex: Female     Is Non-Hispanic African American: No     Diabetic: Yes     Tobacco smoker: No     Systolic Blood Pressure: 147 mmHg     Is BP treated: Yes     HDL Cholesterol: 53 mg/dL     Total Cholesterol: 198 mg/dL    A/P: Diabetes longstanding currently uncontrolled. Patient  is able to verbalize appropriate hypoglycemia management plan but is endorsing relative hypoglycemia. Medication adherence appears appropriate. Cannot change insulin dose with symptoms of hypoglycemia and no home data. Will send in True Metrix supplies for her to our pharmacy.  -Discontinued glipizide. -Continue metformin 1000 mg BID.  -Change Basaglar to 20 units daily.  -True Metrix meter and supplies.  -Patient educated on purpose, proper use, and potential adverse effects of Basaglar, metformin.   -Extensively discussed pathophysiology of diabetes, recommended lifestyle interventions, dietary effects on blood sugar control.  -Counseled on s/sx of and management of hypoglycemia.  -Next A1c anticipated 02/2022.   ASCVD risk - primary prevention in patient with diabetes. Last LDL is not at goal of <70 mg/dL. 10-year ASCVD risk score of 2.9%. At least moderate intensity statin indicated.  -Continued rosuvastatin 10 mg.   Written patient instructions provided. Patient verbalized understanding of treatment plan. Total time in face to face counseling 30 minutes.    Follow up pharmacist clinic visit in 1 month.  Butch Penny, PharmD, [place]y [place], CPP Clinical Pharmacist [facility] [phone]

## 2022-02-11 NOTE — Progress Notes (Unsigned)
S:     Michele Morales is a 46 y.o. female who presents for diabetes evaluation, education, and management. PMH is significant for T2DM. Patient was referred and last seen by Primary Care Provider, Gwinda Passe, on 12/12/21. Patient is uncertain when DM was dx. Review of her chart shows gestational DM but also appears to support a DM dx that predates gestation. She has a history of going without her medications, and her most recent hospital admission was 5/29-5/31/2022. She was admitted with cellulitis of her R foot , found to septic. Her A1c was 13.6% at the time of admission and was discharged on Novolin 70/30. She established care with Marcelino Duster on 02/27/2021 and was changed to insulin glargine. Her metformin was increased to 1000 mg BID, and she was also placed on glipizide 10 mg BID. She went without seeing her PCP until 12/12/2021 and her A1c was found to be 14.8%. Non-compliance was suspected and she was maintained on glargine insulin, glipizide and metformin. A1c at that visit was 14.8%. At last pharmacist visit on 01/10/22, patient reported daily symptoms of relative hypoglycemia but was not checking BG. Glipizide was discontinued and Basaglar was consolidated to once daily dosing with plan for patient to start checking BG.   Today, patient arrives in good spirits and presents without assistance. Her daughter is with her today.  ***increase insulin if home readings. Next pcp visit 7/20  Family/Social History:  Fhx: DM Tobacco: never smoker  Alcohol: none reported   Current diabetes medications include: Basaglar 10 units BID, glipizide 10 mg BID, metformin 1000 mg BID Current hypertension medications include: lisinopril 10 mg daily  Current hyperlipidemia medications include: rosuvastatin 10 mg daily   Patient reports taking all medications as prescribed. Patient reports missing her medications 0 times per week, on average. She feels like her current antihyperglycemic regimen is "too much  for her".  Insurance coverage: self pay  Patient reports relative hypoglycemia. Does not check but reports almost daily symptoms of shaking, weakness, and nausea that occur later in the day.   Reported home fasting blood sugars: not checking  Reported 2 hour post-meal/random blood sugars: not checking.  Patient denies nocturia (nighttime urination).  Patient reports neuropathy (nerve pain). Patient reports visual changes. Patient denies self foot exams.   Patient reported dietary habits:  -Admits to dietary indiscretion  - Eats ~4 tortillas with each meal  - Eats a culturally appropriate diet rich in rice, beans, corn - Does get a fair balance of fresh fruits and non-starchy vegetables per her daughter  Patient-reported exercise habits: Walks about 30 minutes to an hour each day of the week   O:  7 day average blood glucose: no meter  Lab Results  Component Value Date   HGBA1C 14.8 (A) 12/12/2021   There were no vitals filed for this visit.  Lipid Panel     Component Value Date/Time   CHOL 198 02/27/2021 1057   TRIG 152 (H) 02/27/2021 1057   HDL 53 02/27/2021 1057   CHOLHDL 3.7 02/27/2021 1057   LDLCALC 118 (H) 02/27/2021 1057    Clinical Atherosclerotic Cardiovascular Disease (ASCVD): No  The 10-year ASCVD risk score (Arnett DK, et al., 2019) is: 2.9%   Values used to calculate the score:     Age: 66 years     Sex: Female     Is Non-Hispanic African American: No     Diabetic: Yes     Tobacco smoker: No     Systolic Blood  Pressure: 147 mmHg     Is BP treated: Yes     HDL Cholesterol: 53 mg/dL     Total Cholesterol: 198 mg/dL    A/P: Diabetes longstanding currently uncontrolled. Patient is able to verbalize appropriate hypoglycemia management plan but is endorsing relative hypoglycemia. Medication adherence appears appropriate. Cannot change insulin dose with symptoms of hypoglycemia and no home data. Will send in True Metrix supplies for her to our pharmacy.   -Discontinued glipizide. -Continue metformin 1000 mg BID.  -Change Basaglar to 20 units daily.  -True Metrix meter and supplies.  -Patient educated on purpose, proper use, and potential adverse effects of Basaglar, metformin.  -Extensively discussed pathophysiology of diabetes, recommended lifestyle interventions, dietary effects on blood sugar control.  -Counseled on s/sx of and management of hypoglycemia.  -Next A1c anticipated 02/2022.   ASCVD risk - primary prevention in patient with diabetes. Last LDL is not at goal of <70 mg/dL. 10-year ASCVD risk score of 2.9%. At least moderate intensity statin indicated.  -Continued rosuvastatin 10 mg.   Written patient instructions provided. Patient verbalized understanding of treatment plan. Total time in face to face counseling 30 minutes.    Follow up pharmacist clinic visit in 1 month.

## 2022-02-17 ENCOUNTER — Ambulatory Visit: Payer: Self-pay | Admitting: Pharmacist

## 2022-03-13 ENCOUNTER — Ambulatory Visit (INDEPENDENT_AMBULATORY_CARE_PROVIDER_SITE_OTHER): Payer: Self-pay | Admitting: Primary Care

## 2022-04-07 ENCOUNTER — Encounter (INDEPENDENT_AMBULATORY_CARE_PROVIDER_SITE_OTHER): Payer: Self-pay | Admitting: Primary Care

## 2022-04-07 ENCOUNTER — Other Ambulatory Visit: Payer: Self-pay

## 2022-04-07 ENCOUNTER — Ambulatory Visit (INDEPENDENT_AMBULATORY_CARE_PROVIDER_SITE_OTHER): Payer: Self-pay | Admitting: Primary Care

## 2022-04-07 VITALS — BP 156/89 | HR 108 | Temp 98.0°F | Ht 61.0 in | Wt 134.0 lb

## 2022-04-07 DIAGNOSIS — E119 Type 2 diabetes mellitus without complications: Secondary | ICD-10-CM

## 2022-04-07 DIAGNOSIS — I10 Essential (primary) hypertension: Secondary | ICD-10-CM

## 2022-04-07 LAB — POCT GLYCOSYLATED HEMOGLOBIN (HGB A1C): Hemoglobin A1C: 11.3 % — AB (ref 4.0–5.6)

## 2022-04-07 MED ORDER — AMLODIPINE BESYLATE 10 MG PO TABS
10.0000 mg | ORAL_TABLET | Freq: Every day | ORAL | 1 refills | Status: DC
  Filled 2022-04-07: qty 90, 90d supply, fill #0

## 2022-04-07 NOTE — Patient Instructions (Signed)
Hipertensin en los adultos Hypertension, Adult La presin arterial alta (hipertensin) se produce cuando la fuerza de la sangre bombea a travs de las arterias con mucha fuerza. Las arterias son los vasos sanguneos que transportan la sangre desde el corazn al resto del cuerpo. La hipertensin hace que el corazn haga ms esfuerzo para bombear sangre y puede provocar que las arterias se estrechen o endurezcan. La hipertensin no tratada o no controlada puede causar infarto de miocardio, insuficiencia cardaca, accidente cerebrovascular, enfermedad renal y otros problemas. Una lectura de la presin arterial consta de un nmero ms alto sobre un nmero ms bajo. En condiciones ideales, la presin arterial debe estar por debajo de 120/80. El primer nmero ("superior") es la presin sistlica. Es la medida de la presin de las arterias cuando el corazn late. El segundo nmero ("inferior") es la presin diastlica. Es la medida de la presin en las arterias cuando el corazn se relaja. Cules son las causas? Se desconoce la causa exacta de esta afeccin. Hay algunas afecciones que causan presin arterial alta. Qu incrementa el riesgo? Ciertos factores pueden hacer que una persona sea ms propensa a desarrollar presin arterial alta. Algunos de estos factores de riesgo estn bajo su control, por ejemplo, los siguientes: Fumar. No hacer la cantidad suficiente de actividad fsica o ejercicio. Tener sobrepeso. Consumir mucha grasa, azcar, caloras o sal (sodio) en su dieta. Beber alcohol en exceso. Otros factores de riesgo son los siguientes: Tener antecedentes personales de enfermedad cardaca, diabetes, colesterol alto o enfermedad renal. Estrs. Tener antecedentes familiares de presin arterial alta y colesterol alto. Tener apnea obstructiva del sueo. Edad. El riesgo aumenta con la edad. Cules son los signos o sntomas? Es posible que la presin arterial alta no cause sntomas. La presin  arterial muy alta (crisis hipertensiva) puede provocar: Dolor de cabeza. Latidos cardacos acelerados o irregulares (palpitaciones). Falta de aire. Hemorragia nasal. Nuseas y vmitos. Cambios en la visin. Dolor intenso en el pecho, mareos y convulsiones. Cmo se diagnostica? Esta afeccin se diagnostica al medir su presin arterial mientras se encuentra sentado, con el brazo apoyado sobre una superficie plana, las piernas sin cruzar y los pies bien apoyados en el piso. El brazalete del tensimetro debe colocarse directamente sobre la piel de la parte superior del brazo y al nivel de su corazn. La presin arterial debe medirse al menos dos veces en el mismo brazo. Determinadas condiciones pueden causar una diferencia de presin arterial entre el brazo izquierdo y el derecho. Si tiene una lectura de presin arterial alta durante una visita o si tiene presin arterial normal con otros factores de riesgo, se le podr pedir que haga lo siguiente: Que regrese otro da para volver a controlar su presin arterial nuevamente. Que se controle la presin arterial en su casa durante 1 semana o ms. Si se le diagnostica hipertensin, es posible que se le realicen otros anlisis de sangre o estudios de diagnstico por imgenes para ayudar a su mdico a comprender su riesgo general de tener otras afecciones. Cmo se trata? Esta afeccin se trata haciendo cambios saludables en el estilo de vida, tales como ingerir alimentos saludables, realizar ms ejercicio y reducir el consumo de alcohol. Es posible que lo deriven para que reciba asesoramiento sobre una dieta saludable y actividad fsica. El mdico puede recetarle medicamentos si los cambios en el estilo de vida no son suficientes para lograr controlar la presin arterial y si: Su presin arterial sistlica est por encima de 130. Su presin arterial diastlica est por   encima de 80. La presin arterial deseada puede variar en funcin de las enfermedades,  la edad y otros factores personales. Siga estas instrucciones en su casa: Comida y bebida  Siga una dieta con alto contenido de fibras y potasio, y con bajo contenido de sodio, azcar agregada y grasas. Un ejemplo de este plan de alimentacin se denomina dieta DASH. DASH es la sigla en ingls de "Enfoques alimentarios para detener la hipertensin". Para alimentarse de esta manera: Coma mucha fruta y verdura fresca. Trate de que la mitad del plato de cada comida sea de frutas y verduras. Coma cereales integrales, como pasta integral, arroz integral o pan integral. Llene aproximadamente un cuarto del plato con cereales integrales. Coma y beba productos lcteos con bajo contenido de grasa, como leche descremada o yogur bajo en grasas. Evite la ingesta de cortes de carne grasa, carne procesada o curada, y carne de ave con piel. Llene aproximadamente un cuarto del plato con protenas magras, como pescado, pollo sin piel, frijoles, huevos o tofu. Evite ingerir alimentos prehechos y procesados. En general, estos tienen mayor cantidad de sodio, azcar agregada y grasa. Reduzca su ingesta diaria de sodio. Muchas personas que tienen hipertensin deben comer menos de 1,500 mg de sodio por da. No beba alcohol si: Su mdico le indica no hacerlo. Est embarazada, puede estar embarazada o est tratando de quedar embarazada. Si bebe alcohol: Limite la cantidad que bebe a lo siguiente: De 0 a 1 medida por da para las mujeres. De 0 a 2 medidas por da para los hombres. Sepa cunta cantidad de alcohol hay en las bebidas que toma. En los [place], una medida equivale a una botella de cerveza de 12 oz (355 ml), un vaso de vino de 5 oz (148 ml) o un vaso de una bebida alcohlica de alta graduacin de 1 oz (44 ml). Estilo de vida  Trabaje con su mdico para mantener un peso saludable o perder peso. Pregntele cul es el peso recomendado para usted. Realice al menos 30 minutos de ejercicio que haga que se  acelere su corazn (ejercicio aerbico) la mayora de los das de la semana. Estas actividades pueden incluir caminar, nadar o andar en bicicleta. Incluya ejercicios para fortalecer sus msculos (ejercicios de resistencia), como Pilates o levantamiento de pesas, como parte de su rutina semanal de ejercicios. Intente realizar 30 minutos de este tipo de ejercicios al menos tres das a la semana. No consuma ningn producto que contenga nicotina o tabaco. Estos productos incluyen cigarrillos, tabaco para mascar y aparatos de vapeo, como los cigarrillos electrnicos. Si necesita ayuda para dejar de fumar, consulte al mdico. Contrlese la presin arterial en su casa segn las indicaciones del mdico. Concurra a todas las visitas de seguimiento. Esto es importante. Medicamentos Use los medicamentos de venta libre y los recetados solamente como se lo haya indicado el mdico. Siga cuidadosamente las indicaciones. Los medicamentos para la presin arterial deben tomarse segn las indicaciones. No omita las dosis de medicamentos para la presin arterial. Si lo hace, estar en riesgo de tener problemas y puede hacer que los medicamentos sean menos eficaces. Pregntele a su mdico a qu efectos secundarios o reacciones a los medicamentos debe prestar atencin. Comunquese con un mdico si: Piensa que tiene una reaccin a un medicamento que est tomando. Tiene dolores de cabeza frecuentes (recurrentes). Se siente mareado. Tiene hinchazn en los tobillos. Tiene problemas de visin. Solicite ayuda inmediatamente si: Siente un dolor de cabeza intenso o confusin. Siente debilidad inusual o adormecimiento.   Siente que va a desmayarse. Siente un dolor intenso en el pecho o el abdomen. Vomita repetidas veces. Tiene dificultad para respirar. Estos sntomas pueden indicar una emergencia. Solicite ayuda de inmediato. Llame al 911. No espere a ver si los sntomas desaparecen. No conduzca por sus propios medios hasta  el hospital. Resumen La hipertensin se produce cuando la sangre bombea en las arterias con mucha fuerza. Si esta afeccin no se controla, podra correr riesgo de tener complicaciones graves. La presin arterial deseada puede variar en funcin de las enfermedades, la edad y otros factores personales. Para la mayora de las personas, una presin arterial normal es menor que 120/80. La hipertensin se trata con cambios en el estilo de vida, medicamentos o una combinacin de ambos. Los cambios en el estilo de vida incluyen prdida de peso, ingerir alimentos sanos, seguir una dieta baja en sodio, hacer ms ejercicio y limitar el consumo de alcohol. Esta informacin no tiene como fin reemplazar el consejo del mdico. Asegrese de hacerle al mdico cualquier pregunta que tenga. Document Revised: 07/03/2021 Document Reviewed: 07/03/2021 Elsevier Patient Education  2023 [employer] Inc.  

## 2022-04-07 NOTE — Progress Notes (Signed)
Subjective:  Patient ID: Michele Morales, female    DOB: [birth year]  Age: 46 y.o. MRN: [id number]  CC: Diabetes   HPI Michele Morales presents for follow-up of diabetes. Patient does  check blood sugar at home. Management of Hypertension elevated. Patient has No headache, No chest pain, No abdominal pain - No Nausea, No new weakness tingling or numbness, No Cough - shortness of breath   Compliant with meds - Yes Checking CBGs? No  Fasting avg - 75-160  Postprandial average -  Exercising regularly? - walking Watching carbohydrate intake? - Yes Neuropathy ? - No Hypoglycemic events - No  - Recovers with :   Pertinent ROS:  Polyuria - No Polydipsia - No Vision problems - No  Medications as noted below. Taking them regularly without complication/adverse reaction being reported today.   History Michele Morales has a past medical history of Diabetes mellitus without complication ([place]) and Hypercholesteremia.   Michele Morales has a past surgical history that includes No past surgeries.   Her family history includes Diabetes in her mother.Michele Morales reports that Michele Morales has never smoked. Michele Morales has never used smokeless tobacco. Michele Morales reports that Michele Morales does not drink alcohol and does not use drugs.  Current Outpatient Medications on File Prior to Visit  Medication Sig Dispense Refill   Blood Glucose Monitoring Suppl (TRUE METRIX METER) w/Device KIT Use to check blood sugar three times daily. 1 kit 0   glucose blood (TRUE METRIX BLOOD GLUCOSE TEST) test strip Use to check blood sugar three times daily. 100 each 2   Insulin Glargine (BASAGLAR KWIKPEN) 100 UNIT/ML Inject 20 Units into the skin daily. 6 mL 2   lisinopril (ZESTRIL) 10 MG tablet Take 1 tablet (10 mg total) by mouth daily. 90 tablet 1   metFORMIN (GLUCOPHAGE) 1000 MG tablet Take 1 tablet (1,000 mg total) by mouth 2 (two) times daily with a meal. 180 tablet 1   rosuvastatin (CRESTOR) 10 MG tablet Take 1 tablet (10 mg total) by mouth daily. 90 tablet 1    TRUEplus Lancets 28G MISC Use to check blood sugar three times daily. 100 each 2   Insulin Pen Needle (PENTIPS) 32G X 4 MM MISC use as directed (Patient not taking: Reported on 12/12/2021) 200 each 3   No current facility-administered medications on file prior to visit.    ROS Comprehensive ROS Pertinent positive and negative noted in HPI    Objective:  BP (!) 156/89   Pulse (!) 108   Temp 98 F (36.7 C) (Oral)   Ht _0  (1.549 m)   Wt 134 lb (60.8 kg)   LMP 03/19/2022 (Approximate)   SpO2 97%   BMI 25.32 kg/m   BP Readings from Last 3 Encounters:  04/07/22 (!) 156/89  12/12/21 (!) 147/82  04/02/21 (!) 147/87    Wt Readings from Last 3 Encounters:  04/07/22 134 lb (60.8 kg)  12/12/21 123 lb 12.8 oz (56.2 kg)  04/02/21 138 lb 12.8 oz (63 kg)    Physical Exam Vitals reviewed.  Constitutional:      Appearance: Normal appearance. Michele Morales is normal weight.  HENT:     Head: Normocephalic.     Right Ear: Tympanic membrane and external ear normal.     Left Ear: Tympanic membrane and external ear normal.     Nose: Nose normal.  Eyes:     Extraocular Movements: Extraocular movements intact.     Pupils: Pupils are equal, round, and reactive to light.  Cardiovascular:     Pulses:  Normal pulses.     Heart sounds: Normal heart sounds.  Pulmonary:     Effort: Pulmonary effort is normal.     Breath sounds: Normal breath sounds.  Abdominal:     General: Abdomen is flat. Bowel sounds are normal.     Palpations: Abdomen is soft.  Neurological:     Mental Status: Michele Morales is alert and oriented to person, place, and time.  Psychiatric:        Mood and Affect: Mood normal.        Behavior: Behavior normal.    Lab Results  Component Value Date   HGBA1C 11.3 (A) 04/07/2022   HGBA1C 14.8 (A) 12/12/2021   HGBA1C 9.8 (A) 04/02/2021    Lab Results  Component Value Date   WBC 10.5 01/31/2021   HGB 11.9 01/31/2021   HCT 36.2 01/31/2021   PLT 666 (H) 01/31/2021   GLUCOSE 133 (H)  01/31/2021   CHOL 198 02/27/2021   TRIG 152 (H) 02/27/2021   HDL 53 02/27/2021   LDLCALC 118 (H) 02/27/2021   ALT 8 01/31/2021   AST 9 (L) 01/31/2021   NA 138 01/31/2021   K 4.5 01/31/2021   CL 101 01/31/2021   CREATININE 0.51 01/31/2021   BUN 10 01/31/2021   CO2 29 01/31/2021   TSH 1.200 05/07/2017   INR 1.1 01/21/2021   HGBA1C 11.3 (A) 04/07/2022     Assessment & Plan:  Michele Morales was seen today for diabetes.  Diagnoses and all orders for this visit:  Diabetes mellitus without complication ([place]) -     HgB A1c 11.3 some improvement from 14.8 Michele Morales was able to remember our conversation ( Asked her to give PCP 3 problems - blind, loss of toe bad kidneys (Michele Morales did listen) Discussed  co- morbidities with uncontrol diabetes  Complications -diabetic retinopathy, (close your eyes ? What do you see nothing) nephropathy decrease in kidney function- can lead to dialysis-on a machine 3 days a week to filter your kidney, neuropathy- numbness and tinging in your hands and feet,  increase risk of heart attack and stroke, and amputation due to decrease wound healing and circulation. Decrease your risk by taking medication daily as prescribed, monitor carbohydrates- foods that are high in carbohydrates are the following rice, potatoes, breads, sugars, and pastas.  Reduction in the intake (eating) will assist in lowering your blood sugars. Exercise daily at least 30 minutes daily.   Essential hypertension Counseled on blood pressure goal of less than 130/80, low-sodium, DASH diet, medication compliance, 150 minutes of moderate intensity exercise per week. Discussed medication compliance, adverse effects.  -     amLODipine (NORVASC) 10 MG tablet; Take 1 tablet (10 mg total) by mouth daily.  Comprehensive diabetic foot examination, type 2 DM, encounter for [facility]) Completed        I have discontinued Michele Morales's fluconazole. I am also having her start on amLODipine. Additionally, I am having her  maintain her Pentips, lisinopril, metFORMIN, rosuvastatin, Basaglar KwikPen, True Metrix Blood Glucose Test, True Metrix Meter, and TRUEplus Lancets 28G.  Meds ordered this encounter  Medications   amLODipine (NORVASC) 10 MG tablet    Sig: Take 1 tablet (10 mg total) by mouth daily.    Dispense:  90 tablet    Refill:  1    Order Specific Question:   Supervising Provider    Answer:   Tresa Garter [id number]     Follow-up:   Return in about 3 months (around 07/08/2022).  The  above assessment and management plan was discussed with the patient. The patient verbalized understanding of and has agreed to the management plan. Patient is aware to call the clinic if symptoms fail to improve or worsen. Patient is aware when to return to the clinic for a follow-up visit. Patient educated on when it is appropriate to go to the emergency department.   Juluis Mire, NP-C

## 2022-04-08 ENCOUNTER — Other Ambulatory Visit: Payer: Self-pay

## 2022-04-10 ENCOUNTER — Other Ambulatory Visit: Payer: Self-pay

## 2022-05-08 ENCOUNTER — Other Ambulatory Visit: Payer: Self-pay

## 2022-05-12 ENCOUNTER — Ambulatory Visit: Payer: MEDICAID | Attending: Nurse Practitioner | Admitting: Pharmacist

## 2022-05-12 ENCOUNTER — Other Ambulatory Visit: Payer: Self-pay

## 2022-05-12 VITALS — BP 103/62 | HR 102

## 2022-05-12 DIAGNOSIS — Z794 Long term (current) use of insulin: Secondary | ICD-10-CM

## 2022-05-12 DIAGNOSIS — E0840 Diabetes mellitus due to underlying condition with diabetic neuropathy, unspecified: Secondary | ICD-10-CM

## 2022-05-12 MED ORDER — TRULICITY 0.75 MG/0.5ML ~~LOC~~ SOAJ
0.7500 mg | SUBCUTANEOUS | 1 refills | Status: DC
  Filled 2022-05-12: qty 2, 28d supply, fill #0
  Filled 2022-06-11: qty 2, 28d supply, fill #1

## 2022-05-12 MED ORDER — AMLODIPINE BESYLATE 5 MG PO TABS
5.0000 mg | ORAL_TABLET | Freq: Every day | ORAL | 2 refills | Status: DC
  Filled 2022-05-12: qty 30, 30d supply, fill #0
  Filled 2022-06-09 (×2): qty 30, 30d supply, fill #1
  Filled 2022-07-09 (×2): qty 30, 30d supply, fill #2

## 2022-05-12 NOTE — Progress Notes (Signed)
S:    Chief Complaint  Patient presents with   Diabetes   Michele Morales is a 46 y.o. female who presents for diabetes evaluation, education, and management. PMH is significant for T2DM. Patient was referred and last seen by Primary Care Provider, Juluis Mire, 04/07/2022. At last visit, no changes were made to diabetes regimen.   Today, patient arrives in good spirits and presents with her daughter and without any assistance. Patient does report new onset left leg LEE that correlates with the initiation of amlodipine 10 mg daily.   Current diabetes medications include: Basaglar 20 units daily, metformin 1000 mg BID  Current hypertension medications include: amlodipine 10 mg daily, lisinopril 10 mg daily  Current hyperlipidemia medications include: rosuvastatin 10 mg daily  Patient reports adherence to taking all medications as prescribed.   Insurance coverage: Medicaid  Patient reports hypoglycemic events. One isolated event: 60 mg/dL ~2 weeks ago.  Reported home fasting blood sugars: highest 170, mostly 120s for the last 2 weeks.  Reported 2 hour post-meal/random blood sugars: occasionally checks, highest 200 mg/dL, 150-170 mg/dL.  Patient denies nocturia (nighttime urination).  Patient denies neuropathy (nerve pain).  Patient denies visual changes, vision has improved with improved BG control. Patient reports self foot exams.   Patient reported dietary habits: Eats 3 meals/day Dinner: Eats 3 tortillas/day and 1 cup of rice  Snacks: rarely snacks Drinks: Diet coke, water   Patient-reported exercise habits: chores around the house  O:   Lab Results  Component Value Date   HGBA1C 11.3 (A) 04/07/2022   Vitals:   05/12/22 1440  BP: 103/62  Pulse: (!) 102    Lipid Panel     Component Value Date/Time   CHOL 198 02/27/2021 1057   TRIG 152 (H) 02/27/2021 1057   HDL 53 02/27/2021 1057   CHOLHDL 3.7 02/27/2021 1057   LDLCALC 118 (H) 02/27/2021 1057     Clinical Atherosclerotic Cardiovascular Disease (ASCVD): No  The 10-year ASCVD risk score (Arnett DK, et al., 2019) is: 1.5%   Values used to calculate the score:     Age: 49 years     Sex: Female     Is Non-Hispanic African American: No     Diabetic: Yes     Tobacco smoker: No     Systolic Blood Pressure: 951 mmHg     Is BP treated: Yes     HDL Cholesterol: 53 mg/dL     Total Cholesterol: 198 mg/dL   A/P: Diabetes longstanding currently uncontrolled based on A1c. Patient is able to verbalize appropriate hypoglycemia management plan. Medication adherence appears appropriate. Control is suboptimal due to needing therapy intensification. -Continued basal insulin Basaglar (insulin glargine) 20 units daily. Instructed patient to reduce to 14 units daily if hypoglycemia occurs. Patient endorsed understanding.  -Started GLP-1 Trulicity (dulaglutide) 0.75 mg weekly.  -Continued metformin 1000 mg BID.  -Patient educated on purpose, proper use, and potential adverse effects of Trulicity.  -Extensively discussed pathophysiology of diabetes, recommended lifestyle interventions, dietary effects on blood sugar control.  -Counseled on s/sx of and management of hypoglycemia.  -Next A1c anticipated 2 months.   Hypertension longstanding, currently below goal. Blood pressure goal of <130/80 mmHg. Medication adherence appropriate. Left leg unilateral LEE appreciated on exam. Onset correlating to the initiation of amlodipine 10 mg. -Reduce amlodipine to 5 mg daily due to LEE. -Lipid panel and CMET collected today.   Follow-up:  Pharmacist 1 moth. PCP clinic visit in 2 months.   Joseph Art,  Pharm.D. PGY-2 Ambulatory Care Pharmacy Resident 05/12/2022 2:53 PM

## 2022-05-13 ENCOUNTER — Other Ambulatory Visit: Payer: Self-pay

## 2022-05-13 LAB — CMP14+EGFR
ALT: 9 IU/L (ref 0–32)
AST: 9 IU/L (ref 0–40)
Albumin/Globulin Ratio: 1.9 (ref 1.2–2.2)
Albumin: 4.4 g/dL (ref 3.9–4.9)
Alkaline Phosphatase: 78 IU/L (ref 44–121)
BUN/Creatinine Ratio: 24 — ABNORMAL HIGH (ref 9–23)
BUN: 20 mg/dL (ref 6–24)
Bilirubin Total: 0.3 mg/dL (ref 0.0–1.2)
CO2: 19 mmol/L — ABNORMAL LOW (ref 20–29)
Calcium: 8.9 mg/dL (ref 8.7–10.2)
Chloride: 102 mmol/L (ref 96–106)
Creatinine, Ser: 0.85 mg/dL (ref 0.57–1.00)
Globulin, Total: 2.3 g/dL (ref 1.5–4.5)
Glucose: 244 mg/dL — ABNORMAL HIGH (ref 70–99)
Potassium: 4.3 mmol/L (ref 3.5–5.2)
Sodium: 138 mmol/L (ref 134–144)
Total Protein: 6.7 g/dL (ref 6.0–8.5)
eGFR: 86 mL/min/{1.73_m2} (ref >59)

## 2022-05-13 LAB — LIPID PANEL
Chol/HDL Ratio: 2.1 ratio (ref 0.0–4.4)
Cholesterol, Total: 95 mg/dL — ABNORMAL LOW (ref 100–199)
HDL: 45 mg/dL (ref >39)
LDL Chol Calc (NIH): 34 mg/dL (ref 0–99)
Triglycerides: 77 mg/dL (ref 0–149)
VLDL Cholesterol Cal: 16 mg/dL (ref 5–40)

## 2022-06-09 ENCOUNTER — Other Ambulatory Visit: Payer: Self-pay

## 2022-06-11 ENCOUNTER — Other Ambulatory Visit: Payer: Self-pay

## 2022-06-16 ENCOUNTER — Ambulatory Visit: Payer: Self-pay | Attending: Primary Care | Admitting: Pharmacist

## 2022-06-16 ENCOUNTER — Other Ambulatory Visit: Payer: Self-pay

## 2022-06-16 ENCOUNTER — Encounter: Payer: Self-pay | Admitting: Pharmacist

## 2022-06-16 DIAGNOSIS — Z794 Long term (current) use of insulin: Secondary | ICD-10-CM

## 2022-06-16 DIAGNOSIS — E0840 Diabetes mellitus due to underlying condition with diabetic neuropathy, unspecified: Secondary | ICD-10-CM

## 2022-06-16 DIAGNOSIS — E119 Type 2 diabetes mellitus without complications: Secondary | ICD-10-CM

## 2022-06-16 MED ORDER — TRULICITY 1.5 MG/0.5ML ~~LOC~~ SOAJ
1.5000 mg | SUBCUTANEOUS | 3 refills | Status: DC
  Filled 2022-06-16 – 2022-07-10 (×2): qty 2, 28d supply, fill #0

## 2022-06-16 MED ORDER — BASAGLAR KWIKPEN 100 UNIT/ML ~~LOC~~ SOPN
16.0000 [IU] | PEN_INJECTOR | Freq: Every day | SUBCUTANEOUS | 2 refills | Status: DC
  Filled 2022-06-16 – 2022-08-12 (×2): qty 6, 37d supply, fill #0

## 2022-06-16 NOTE — Progress Notes (Signed)
S:    No chief complaint on file.  Michele Morales is a 46 y.o. female who presents for diabetes evaluation, education, and management. PMH is significant for T2DM. Patient was referred and last seen by Primary Care Provider, Gwinda Passe, 04/07/2022. At last visit, A1c was 11.3% and no changes were made to diabetes regimen. Last seen by pharmacy clinic on 05/12/2022 where Trulicity 0.75 mg weekly was restarted and amlodipine was reduced to 5 mg daily due to LEE.   Today, patient arrives in good spirits and presents with her daughter and without any assistance. Reports 2 nocturnal hypoglycemic events in the 50s-60s that occurred around 4AM. She did not reduced insulin as instructed last visit if hypoglycemia occurred. Endorses she takes insulin around 11AM "to help it work longer". Reports she has tolerated Trulicity well and denies any GI side effects. She does report that LEE resolved after decreasing amlodipine to 5 mg daily.   Current diabetes medications include: Basaglar 20 units daily, metformin 1000 mg BID, Trulicity 0.75 mg weekly (Monday's) Current hypertension medications include: amlodipine 5 mg daily, lisinopril 10 mg daily  Current hyperlipidemia medications include: rosuvastatin 10 mg daily  Patient reports adherence to taking all medications as prescribed.   Insurance coverage: Medicaid  Patient reports hypoglycemic events. 2 events: 55-60 mg/dL, most recent was ~1 weeks ago at 4 AM.  Reported home fasting blood sugars: 90-110 mg/dL, most readings NOT >188 mg/dL, 1 isolated event with FBG 170 a few mornings ago.   Reported 2 hour post-meal/random blood sugars: not checking   Patient denies nocturia (nighttime urination).  Patient denies neuropathy (nerve pain).  Patient denies visual changes, vision has improved with improved BG control. Patient reports self foot exams.   Patient reported dietary habits: Eats 3 meals/day Dinner: Eats 3 tortillas/day and 1 cup of rice   Snacks: rarely snacks Drinks: Diet coke, water   Patient-reported exercise habits: chores around the house  O:   Lab Results  Component Value Date   HGBA1C 11.3 (A) 04/07/2022   Vitals:   06/16/22 1018  BP: 110/74  Pulse: 93   Lipid Panel     Component Value Date/Time   CHOL 95 (L) 05/12/2022 1445   TRIG 77 05/12/2022 1445   HDL 45 05/12/2022 1445   CHOLHDL 2.1 05/12/2022 1445   LDLCALC 34 05/12/2022 1445    Clinical Atherosclerotic Cardiovascular Disease (ASCVD): No  The ASCVD Risk score (Arnett DK, et al., 2019) failed to calculate for the following reasons:   The valid total cholesterol range is 130 to 320 mg/dL   A/P: Diabetes longstanding currently uncontrolled based on A1c. Patient is able to verbalize appropriate hypoglycemia management plan. Medication adherence appears appropriate. Control is suboptimal due to needing therapy intensification. -Decreased basal insulin Basaglar (insulin glargine) from 20 units to 16 units daily. Instructed patient to take first thing in the morning instead of 11AM to prevent nocturnal hypoglycemia.  -Increased GLP-1 Trulicity (dulaglutide) from 0.75 mg to 1.5 mg weekly.  -Continued metformin 1000 mg BID.  -Patient educated on purpose, proper use, and potential adverse effects of Trulicity.  -Extensively discussed pathophysiology of diabetes, recommended lifestyle interventions, dietary effects on blood sugar control.  -Counseled on s/sx of and management of hypoglycemia.  -Next A1c anticipated 2 months.   Hypertension longstanding, currently below goal. Blood pressure goal of <130/80 mmHg. Medication adherence appropriate. LEE has resolved.  -Continue amlodipine 5 mg daily.  Follow-up:  PCP clinic visit on 07/09/2022  Valeda Malm, Pharm.D.  PGY-2 Ambulatory Care Pharmacy Resident 06/16/2022 12:14 PM

## 2022-06-17 ENCOUNTER — Other Ambulatory Visit: Payer: Self-pay

## 2022-06-18 ENCOUNTER — Other Ambulatory Visit: Payer: Self-pay

## 2022-07-09 ENCOUNTER — Other Ambulatory Visit: Payer: Self-pay

## 2022-07-09 ENCOUNTER — Ambulatory Visit (INDEPENDENT_AMBULATORY_CARE_PROVIDER_SITE_OTHER): Payer: Self-pay | Admitting: Primary Care

## 2022-07-10 ENCOUNTER — Other Ambulatory Visit: Payer: Self-pay

## 2022-08-12 ENCOUNTER — Other Ambulatory Visit (INDEPENDENT_AMBULATORY_CARE_PROVIDER_SITE_OTHER): Payer: Self-pay | Admitting: Primary Care

## 2022-08-12 ENCOUNTER — Other Ambulatory Visit: Payer: Self-pay | Admitting: Family Medicine

## 2022-08-12 ENCOUNTER — Other Ambulatory Visit: Payer: Self-pay

## 2022-08-12 DIAGNOSIS — Z794 Long term (current) use of insulin: Secondary | ICD-10-CM

## 2022-08-12 DIAGNOSIS — E119 Type 2 diabetes mellitus without complications: Secondary | ICD-10-CM

## 2022-08-12 MED ORDER — ROSUVASTATIN CALCIUM 10 MG PO TABS
10.0000 mg | ORAL_TABLET | Freq: Every day | ORAL | 2 refills | Status: DC
  Filled 2022-08-12: qty 90, 90d supply, fill #0

## 2022-08-12 MED ORDER — AMLODIPINE BESYLATE 5 MG PO TABS
5.0000 mg | ORAL_TABLET | Freq: Every day | ORAL | 0 refills | Status: DC
  Filled 2022-08-12: qty 30, 30d supply, fill #0

## 2022-08-12 NOTE — Telephone Encounter (Signed)
Requested Prescriptions  Pending Prescriptions Disp Refills   rosuvastatin (CRESTOR) 10 MG tablet 90 tablet 2    Sig: Take 1 tablet (10 mg total) by mouth daily.     Cardiovascular:  Antilipid - Statins 2 Failed - 08/12/2022 12:36 PM      Failed - Lipid Panel in normal range within the last 12 months    Cholesterol, Total  Date Value Ref Range Status  05/12/2022 95 (L) 100 - 199 mg/dL Final   LDL Chol Calc (NIH)  Date Value Ref Range Status  05/12/2022 34 0 - 99 mg/dL Final   HDL  Date Value Ref Range Status  05/12/2022 45 >39 mg/dL Final   Triglycerides  Date Value Ref Range Status  05/12/2022 77 0 - 149 mg/dL Final         Passed - Cr in normal range and within 360 days    Creat  Date Value Ref Range Status  01/31/2021 0.51 0.50 - 1.10 mg/dL Final   Creatinine, Ser  Date Value Ref Range Status  05/12/2022 0.85 0.57 - 1.00 mg/dL Final   Creatinine, Urine  Date Value Ref Range Status  08/09/2008 33.5 mg/dL Final         Passed - Patient is not pregnant      Passed - Valid encounter within last 12 months    Recent Outpatient Visits           1 month ago Diabetes mellitus due to underlying condition with diabetic neuropathy, with long-term current use of insulin (HCC)   [place] [facility] [place], Jeannett Senior L, RPH-CPP   3 months ago Diabetes mellitus due to underlying condition with diabetic neuropathy, with long-term current use of insulin [facility])   [place] [facility] [place], Jeannett Senior L, RPH-CPP   4 months ago Diabetes mellitus without complication (HCC)   [facility] Grayce Sessions, NP   7 months ago Diabetes mellitus with complication [facility])    [facility] [place], Jeannett Senior L, RPH-CPP   8 months ago Diabetes mellitus with complication [facility])   [place] [facility] Grayce Sessions, NP

## 2022-08-13 ENCOUNTER — Other Ambulatory Visit: Payer: Self-pay

## 2022-08-15 ENCOUNTER — Other Ambulatory Visit: Payer: Self-pay

## 2022-10-04 ENCOUNTER — Emergency Department (HOSPITAL_COMMUNITY): Payer: Self-pay

## 2022-10-04 ENCOUNTER — Other Ambulatory Visit: Payer: Self-pay

## 2022-10-04 ENCOUNTER — Emergency Department (HOSPITAL_COMMUNITY)
Admission: EM | Admit: 2022-10-04 | Discharge: 2022-10-04 | Disposition: A | Discharge status: Home / Self Care | Payer: Self-pay | Attending: Emergency Medicine | Admitting: Emergency Medicine

## 2022-10-04 ENCOUNTER — Encounter (HOSPITAL_COMMUNITY): Payer: Self-pay

## 2022-10-04 DIAGNOSIS — S52501A Unspecified fracture of the lower end of right radius, initial encounter for closed fracture: Secondary | ICD-10-CM | POA: Insufficient documentation

## 2022-10-04 DIAGNOSIS — Z794 Long term (current) use of insulin: Secondary | ICD-10-CM | POA: Insufficient documentation

## 2022-10-04 DIAGNOSIS — W010XXA Fall on same level from slipping, tripping and stumbling without subsequent striking against object, initial encounter: Secondary | ICD-10-CM | POA: Insufficient documentation

## 2022-10-04 DIAGNOSIS — E119 Type 2 diabetes mellitus without complications: Secondary | ICD-10-CM | POA: Insufficient documentation

## 2022-10-04 DIAGNOSIS — Z7984 Long term (current) use of oral hypoglycemic drugs: Secondary | ICD-10-CM | POA: Insufficient documentation

## 2022-10-04 MED ORDER — PROPOFOL 10 MG/ML IV BOLUS
INTRAVENOUS | Status: AC | PRN
  Administered 2022-10-04: 50 mg via INTRAVENOUS
  Administered 2022-10-04: 20 mg via INTRAVENOUS

## 2022-10-04 MED ORDER — SODIUM CHLORIDE 0.9 % IV BOLUS
1000.0000 mL | Freq: Once | INTRAVENOUS | Status: AC
  Administered 2022-10-04: 1000 mL via INTRAVENOUS

## 2022-10-04 MED ORDER — ONDANSETRON HCL 4 MG/2ML IJ SOLN
INTRAMUSCULAR | Status: AC | PRN
  Administered 2022-10-04: 4 mg via INTRAVENOUS

## 2022-10-04 MED ORDER — PROPOFOL 10 MG/ML IV BOLUS
1.0000 mg/kg | Freq: Once | INTRAVENOUS | Status: DC
  Filled 2022-10-04: qty 20

## 2022-10-04 MED ORDER — ONDANSETRON HCL 4 MG/2ML IJ SOLN
4.0000 mg | Freq: Once | INTRAMUSCULAR | Status: DC
  Filled 2022-10-04: qty 2

## 2022-10-04 MED ORDER — OXYCODONE-ACETAMINOPHEN 5-325 MG PO TABS: 1.0000 | ORAL_TABLET | Freq: Four times a day (QID) | ORAL | 0 refills | Status: DC | PRN

## 2022-10-04 MED ORDER — SODIUM CHLORIDE 0.9 % IV SOLN
INTRAVENOUS | Status: AC | PRN
  Administered 2022-10-04: 150 mL/h via INTRAVENOUS

## 2022-10-04 MED ORDER — HYDROMORPHONE HCL 1 MG/ML IJ SOLN
1.0000 mg | Freq: Once | INTRAMUSCULAR | Status: AC
  Administered 2022-10-04: 1 mg via INTRAVENOUS
  Filled 2022-10-04: qty 1

## 2022-10-04 NOTE — ED Notes (Signed)
Informed consent completed with patient with MD Curatolo at bedside. Pt's daughter signed consent due to patients injury.

## 2022-10-04 NOTE — Progress Notes (Signed)
Orthopedic Tech Progress Note Patient Details:  Michele Morales August 30, 1975 [id number]  Ortho Devices Type of Ortho Device: Sugartong splint, Arm sling Ortho Device/Splint Location: RUE Ortho Device/Splint Interventions: Application   Post Interventions Instructions Provided: Care of device  Michele Morales [name] 10/04/2022, 5:06 PM

## 2022-10-04 NOTE — ED Provider Notes (Signed)
Shared PA visit.  Patient fell and injured her right arm.  No significant medical history.  She has deformity to the right wrist.  She is neurovascular neuromuscular intact.  X-ray per my review and interpretation appears that she has a comminuted impacted and angulated and displaced intra-articular fracture of the wrist.  This involves the radius.  I have consented the patient for reduction of right wrist fracture under propofol sedation.  She last ate about 7 hours ago.  She has no allergies.  Will reduce fracture and placed in a splint and have her follow-up with hand.  Per my review and interpretation of postreduction x-ray I think there is improvement of alignment of the wrist fracture.  She is neurovascular neuromuscular intact while in splint.  Will have her follow-up with hand team.  Discharge.  .Sedation  Date/Time: 10/04/2022 2:22 PM  Performed by: Lennice Sites, DO Authorized by: Lennice Sites, DO   Consent:    Consent obtained:  Written   Consent given by:  Patient   Risks discussed:  Allergic reaction, dysrhythmia, inadequate sedation, nausea, prolonged hypoxia resulting in organ damage, prolonged sedation necessitating reversal, respiratory compromise necessitating ventilatory assistance and intubation and vomiting   Alternatives discussed:  Analgesia without sedation Universal protocol:    Procedure explained and questions answered to patient or proxy's satisfaction: yes     Relevant documents present and verified: yes     Test results available: yes     Imaging studies available: yes     Immediately prior to procedure, a time out was called: yes     Patient identity confirmed:  Verbally with patient, arm band and hospital-assigned identification number Indications:    Procedure performed:  Fracture reduction   Procedure necessitating sedation performed by:  Physician performing sedation Pre-sedation assessment:    Time since last food or drink:  7 hours   ASA  classification: class 1 - normal, healthy patient     Mouth opening:  3 or more finger widths   Thyromental distance:  4 finger widths   Mallampati score:  I - soft palate, uvula, fauces, pillars visible   Neck mobility: normal     Pre-sedation assessments completed and reviewed: airway patency, cardiovascular function, hydration status, mental status, nausea/vomiting, pain level, respiratory function and temperature     Pre-sedation assessment completed:  10/04/2022 2:22 PM Immediate pre-procedure details:    Reassessment: Patient reassessed immediately prior to procedure     Reviewed: vital signs, relevant labs/tests and NPO status     Verified: bag valve mask available, emergency equipment available, intubation equipment available, IV patency confirmed, oxygen available and reversal medications available   Procedure details (see MAR for exact dosages):    Preoxygenation:  Nasal cannula   Sedation:  Propofol   Intended level of sedation: deep   Intra-procedure monitoring:  Blood pressure monitoring, cardiac monitor, continuous capnometry, continuous pulse oximetry, frequent LOC assessments and frequent vital sign checks   Total Provider sedation time (minutes):  25 Post-procedure details:    Post-sedation assessment completed:  10/04/2022 5:16 PM   Attendance: Constant attendance by certified staff until patient recovered     Recovery: Patient returned to pre-procedure baseline     Post-sedation assessments completed and reviewed: airway patency, cardiovascular function, hydration status, mental status, nausea/vomiting, pain level, respiratory function and temperature     Patient is stable for discharge or admission: yes     Procedure completion:  Tolerated well, no immediate complications .Ortho Injury Treatment  Date/Time: 10/04/2022  5:17 PM  Performed by: Lennice Sites, DO Authorized by: Lennice Sites, DO   Consent:    Consent obtained:  Written   Consent given by:  Patient    Risks discussed:  Fracture, irreducible dislocation, nerve damage, recurrent dislocation, restricted joint movement, stiffness and vascular damage   Alternatives discussed:  No treatmentInjury location: wrist Location details: right wrist Injury type: fracture Fracture type: distal radius Pre-procedure neurovascular assessment: neurovascularly intact Pre-procedure distal perfusion: normal Pre-procedure neurological function: normal Pre-procedure range of motion: reduced  Patient sedated: Yes. Refer to sedation procedure documentation for details of sedation. Manipulation performed: yes Skeletal traction used: yes Reduction successful: yes X-ray confirmed reduction: yes Immobilization: splint Splint type: sugar tong Splint Applied by: Sheliah Hatch Post-procedure neurovascular assessment: post-procedure neurovascularly intact Post-procedure distal perfusion: normal Post-procedure neurological function: normal Post-procedure range of motion comment: in splint        Lennice Sites, DO 10/04/22 1717

## 2022-10-04 NOTE — Discharge Instructions (Addendum)
Please follow-up with hand surgery, for further evaluation.  Make sure you do not get the cast wet, and that you are elevating your arm.  If you start having any kind of numbness, inability move your fingers, coolness of your hand, or if you get your cast wet please return to the ER.

## 2022-10-04 NOTE — ED Provider Notes (Signed)
[facility] Provider Note   CSN: [id number] Arrival date & time: 10/04/22  1313     History  Chief Complaint  Patient presents with   Arm Injury    Michele Morales is a 47 y.o. female, history of diabetes, who presents to the ED secondary to right wrist pain that occurred about less than an hour ago, after she was trying to pick up one of her plants, and actually tripped and fell backwards.  She states she attempted to use her right arm to help catch her fall, and her snap, and had severe pain afterwards.  States she can feel her fingers, but that it hurts to move them quite a bit.  Is not any blood thinners, denies any head trauma or other pain.     Home Medications Prior to Admission medications   Medication Sig Start Date End Date Taking? Authorizing Provider  oxyCODONE-acetaminophen (PERCOCET/ROXICET) 5-325 MG tablet Take 1 tablet by mouth every 6 (six) hours as needed for severe pain. 10/04/22  Yes Elvina Bosch L, PA  amLODipine (NORVASC) 5 MG tablet Take 1 tablet (5 mg total) by mouth daily. 08/12/22   Charlott Rakes, MD  Blood Glucose Monitoring Suppl (TRUE METRIX METER) w/Device KIT Use to check blood sugar three times daily. 01/10/22   Charlott Rakes, MD  Dulaglutide (TRULICITY) 1.5 0000000 SOPN Inject 1.5 mg into the skin once a week. 06/16/22   Charlott Rakes, MD  glucose blood (TRUE METRIX BLOOD GLUCOSE TEST) test strip Use to check blood sugar three times daily. 01/10/22   Charlott Rakes, MD  Insulin Glargine (BASAGLAR KWIKPEN) 100 UNIT/ML Inject 16 Units into the skin daily. 06/16/22   Charlott Rakes, MD  Insulin Pen Needle (PENTIPS) 32G X 4 MM MISC use as directed Patient not taking: Reported on 12/12/2021 01/22/21   Mariel Aloe, MD  lisinopril (ZESTRIL) 10 MG tablet Take 1 tablet (10 mg total) by mouth daily. 12/12/21   Kerin Perna, NP  metFORMIN (GLUCOPHAGE) 1000 MG tablet Take 1 tablet (1,000 mg total) by mouth  2 (two) times daily with a meal. 12/12/21   Kerin Perna, NP  rosuvastatin (CRESTOR) 10 MG tablet Take 1 tablet (10 mg total) by mouth daily. 08/12/22   Kerin Perna, NP  TRUEplus Lancets 28G MISC Use to check blood sugar three times daily. 01/10/22   Charlott Rakes, MD      Allergies    Patient has no known allergies.    Review of Systems   Review of Systems  Musculoskeletal:        Wrist pain right  Skin:  Negative for wound.    Physical Exam Updated Vital Signs BP 137/81   Pulse 88   Temp 98.4 F (36.9 C) (Oral)   Resp 16   Ht 5' 1"$  (1.549 m)   Wt 60.8 kg   LMP  (LMP Unknown)   SpO2 98%   BMI 25.32 kg/m  Physical Exam Vitals and nursing note reviewed.  Constitutional:      General: She is not in acute distress.    Appearance: She is well-developed.  HENT:     Head: Normocephalic and atraumatic.  Eyes:     General:        Right eye: No discharge.        Left eye: No discharge.     Conjunctiva/sclera: Conjunctivae normal.  Pulmonary:     Effort: No respiratory distress.  Musculoskeletal:  Comments: Right arm/wrist/hand: TTP of dorsal aspect of R wrist w/obvious deformity (appears  to be displaced dorsally). Radial pulses present. Grip strength reduced. Difficulty flexion, extending, and deviating wrist. Point discrimination intact. Normal thumb opposition. Intact ROM for all MCPs, PIPs, and DIPs.  No snuffbox ttp. No sensory deficits. Capillary refill <2sec. TTP of dorsal aspect of right lower arm.    Neurological:     Mental Status: She is alert.     Comments: Clear speech.   Psychiatric:        Behavior: Behavior normal.        Thought Content: Thought content normal.     ED Results / Procedures / Treatments   Labs (all labs ordered are listed, but only abnormal results are displayed) Labs Reviewed - No data to display  EKG None  Radiology DG Wrist Complete Right  Result Date: 10/04/2022 CLINICAL DATA:  Distal radial fracture status  post reduction EXAM: RIGHT WRIST - COMPLETE 3+ VIEW COMPARISON:  Same-day x-ray FINDINGS: Comminuted fracture of the distal radius with improved alignment status post reduction. Decreased angulation. Widened appearance of the radiocarpal joint space may be secondary to space occupying hemarthrosis. No new fractures. Overlying splint material. IMPRESSION: Comminuted fracture of the distal radius with improved alignment status post reduction. Electronically Signed   By: Davina Poke D.O.   On: 10/04/2022 17:23   DG Wrist Complete Right  Result Date: 10/04/2022 CLINICAL DATA:  Fall with RIGHT wrist pain. EXAM: RIGHT WRIST - COMPLETE 3+ VIEW COMPARISON:  None Available. FINDINGS: A comminuted impacted intra-articular distal radial fracture is identified with apex anterior angulation. 7 mm dorsal displacement of the distal radial fragment noted. There is no evidence of dislocation. No other fractures are noted. IMPRESSION: Comminuted impacted, angulated and displaced intra-articular distal radial fracture. Electronically Signed   By: Margarette Canada M.D.   On: 10/04/2022 14:11    Procedures Procedures   Medications Ordered in ED Medications  ondansetron (ZOFRAN) injection 4 mg (4 mg Intravenous Not Given 10/04/22 1707)  HYDROmorphone (DILAUDID) injection 1 mg (1 mg Intravenous Given 10/04/22 1716)  sodium chloride 0.9 % bolus 1,000 mL (0 mLs Intravenous Stopped 10/04/22 1755)  propofol (DIPRIVAN) 10 mg/mL bolus/IV push (20 mg Intravenous Given 10/04/22 1657)  0.9 %  sodium chloride infusion (0 mLs Intravenous Stopped 10/04/22 1755)  ondansetron (ZOFRAN) injection (4 mg Intravenous Given 10/04/22 1652)    ED Course/ Medical Decision Making/ A&P                           Medical Decision Making Patient is a 47 year old female, here for right wrist pain, that occurred after falling while trying to pick up something in her garden.  She has an obvious deformity on exam we will obtain x-rays, and give her oral  pain medication.  Amount and/or Complexity of Data Reviewed Radiology: ordered.    Details: X-rays show comminuted fracture of the distal radius, that is displaced, and angulated. Discussion of management or test interpretation with external provider(s): Discussed x-rays with Dr.  Ronnald Nian, we will perform a reduction, notified nursing of this multiple times, and to get orthopedics, respiratory and pharmacy, after multiple request, still not completed, finally by oncoming nurse, completed requirements, and reduction was performed by Dr. Ronnald Nian, it was successful, repeat x-rays show much better alignment.  She was placed in a sugar-tong splint, and encouraged to follow-up with hand.  Percocets were prescribed for pain control.  We discussed  return precautions.  Risk Prescription drug management.    Final Clinical Impression(s) / ED Diagnoses Final diagnoses:  Closed fracture of distal end of right radius, unspecified fracture morphology, initial encounter    Rx / DC Orders ED Discharge Orders          Ordered    oxyCODONE-acetaminophen (PERCOCET/ROXICET) 5-325 MG tablet  Every 6 hours PRN        10/04/22 1741              Meng Winterton, Si Gaul, PA 10/04/22 2039    Lennice Sites, DO 10/04/22 2044

## 2022-10-04 NOTE — ED Triage Notes (Addendum)
Reports was trying to pick up one of her plants and fell backwards.  Reports pain from right shoulder to wrist.  No LOC  Deformity noted to right wrist +radial pulse

## 2022-10-04 NOTE — ED Notes (Signed)
Discharge instructions reviewed with patient and daughter at bedside. Patient and daughter verbalized understanding. Patient denies any questions or concerns at this time. Patient ambulatory with this RN and daughter out of ED. Patient assisted into vehicle with this RN and daughter.

## 2022-10-13 ENCOUNTER — Encounter (HOSPITAL_COMMUNITY): Payer: Self-pay | Admitting: Orthopedic Surgery

## 2022-10-14 ENCOUNTER — Encounter (HOSPITAL_COMMUNITY): Payer: Self-pay | Admitting: Orthopedic Surgery

## 2022-10-14 NOTE — Progress Notes (Signed)
Used [employer] for information and instructions for [employer] with patient.  ID# [id number], Michele Morales.  No Answer.  Will Call Back.  Used Spanish Interpreter ID# [phone]   PCP - Juluis Mire, NP Cardiologist - n/a  Chest x-ray - n/a EKG - DOS Stress Test - n/a ECHO - n/a Cardiac Cath - n/a  ICD Pacemaker/Loop - n/a  Sleep Study -  n/a CPAP - none  Diabetes  Do not take Metformin on the morning of surgery.       THE MORNING OF SURGERY, take 16 units of Insulin Glargine.    If your blood sugar is less than 70 mg/dL, you will need to treat for low blood sugar: Treat a low blood sugar (less than 70 mg/dL) with  cup of clear juice (cranberry or apple), 4 glucose tablets, OR glucose gel. Recheck blood sugar in 15 minutes after treatment (to make sure it is greater than 70 mg/dL). If your blood sugar is not greater than 70 mg/dL on recheck, call [phone] for further instructions.  ERAS: Clear liquids til 10:15 AM DOS.  Anesthesia review: Yes  STOP now taking any Aspirin (unless otherwise instructed by your surgeon), Aleve, Naproxen, Ibuprofen, Motrin, Advil, Goody's, BC's, all herbal medications, fish oil, and all vitamins.   Coronavirus Screening Do you have any of the following symptoms:  Cough yes/no: No Fever (>100.49F)  yes/no: No Runny nose yes/no: No Sore throat yes/no: No Difficulty breathing/shortness of breath  yes/no: No  Have you traveled in the last 14 days and where? yes/no: No  Patient verbalized understanding of instructions that were given via phone via [employer] ID [phone].

## 2022-10-14 NOTE — H&P (Signed)
Michele Morales is an 47 y.o. female.   Chief Complaint: RIGHT WRIST PAIN HPI: PT REFERRED FROM ED AFTER INJURING HER DOMINANT RIGHT WRIST PT SEEN AND EVALUATED IN THE OFFICE AND WAS NOTED TO HAVE RIGHT DISTAL RADIUS FRACTURE THAT IS DISPLACED PT HERE FOR SURGERY ON HER RIGHT WRIST  Past Medical History:  Diagnosis Date   Diabetes mellitus without complication ([place])    IDDM   Hypercholesteremia    Hypertension    Neuromuscular disorder ([place])    diabetic neuropathy feet    Past Surgical History:  Procedure Laterality Date   hand procedure Right    right wrist - used sedation to reset in the ER Dept    Family History  Problem Relation Age of Onset   Diabetes Mother    Social History:  reports that she has never smoked. She has never used smokeless tobacco. She reports that she does not drink alcohol and does not use drugs.  Allergies: No Known Allergies  No medications prior to admission.    No results found for this or any previous visit (from the past 48 hour(s)). No results found.  ROS: NO RECENT ILLNESSES OR HOSPITALIZATIONS  Height 5' 1"$  (1.549 m), weight 60.8 kg, last menstrual period 09/17/2022. Physical Exam  General Appearance:  Alert, cooperative, no distress, appears stated age  Head:  Normocephalic, without obvious abnormality, atraumatic  Eyes:  Pupils equal, conjunctiva/corneas clear,         Throat: Lips, mucosa, and tongue normal; teeth and gums normal  Neck: No visible masses     Lungs:   respirations unlabored  Chest Wall:  No tenderness or deformity  Heart:  Regular rate and rhythm,  Abdomen:   Soft, non-tender,         Extremities: RUE: SKIN INTACT, FINGERS WARM WELL PERFUSED GOOD MUSCLE TONE NVI RIGHT HAND  Pulses: 2+ and symmetric  Skin: Skin color, texture, turgor normal, no rashes or lesions     Neurologic: Normal     Assessment/Plan RIGHT DISTAL RADIUS FRACTURE DISPLACED  RIGHT DISTAL RADIUS OPEN REDUCTION AND INTERNAL FIXATION  AND REPAIR AS INDICATED  R/B/A DISCUSSED WITH PT IN OFFICE.  PT VOICED UNDERSTANDING OF PLAN CONSENT SIGNED DAY OF SURGERY PT SEEN AND EXAMINED PRIOR TO OPERATIVE PROCEDURE/DAY OF SURGERY SITE MARKED. QUESTIONS ANSWERED WILL GO HOME FOLLOWING SURGERY   WE ARE PLANNING SURGERY FOR YOUR UPPER EXTREMITY. THE RISKS AND BENEFITS OF SURGERY INCLUDE BUT NOT LIMITED TO BLEEDING INFECTION, DAMAGE TO NEARBY NERVES ARTERIES TENDONS, FAILURE OF SURGERY TO ACCOMPLISH ITS INTENDED GOALS, PERSISTENT SYMPTOMS AND NEED FOR FURTHER SURGICAL INTERVENTION. WITH THIS IN MIND WE WILL PROCEED. I HAVE DISCUSSED WITH THE PATIENT THE PRE AND POSTOPERATIVE REGIMEN AND THE DOS AND DON'TS. PT VOICED UNDERSTANDING AND INFORMED CONSENT SIGNED.   Janelle Floor [facility] 10/15/2022 @ 1300

## 2022-10-15 ENCOUNTER — Encounter (HOSPITAL_COMMUNITY)
Admission: RE | Disposition: A | Discharge status: Home / Self Care | Payer: Self-pay | Source: Home / Self Care | Attending: Orthopedic Surgery

## 2022-10-15 ENCOUNTER — Encounter (HOSPITAL_COMMUNITY): Payer: Self-pay | Admitting: Orthopedic Surgery

## 2022-10-15 ENCOUNTER — Ambulatory Visit (HOSPITAL_COMMUNITY): Payer: Self-pay

## 2022-10-15 ENCOUNTER — Ambulatory Visit (HOSPITAL_BASED_OUTPATIENT_CLINIC_OR_DEPARTMENT_OTHER): Payer: Self-pay | Admitting: Physician Assistant

## 2022-10-15 ENCOUNTER — Ambulatory Visit (HOSPITAL_COMMUNITY)
Admission: RE | Admit: 2022-10-15 | Discharge: 2022-10-15 | Disposition: A | Discharge status: Home / Self Care | Payer: Self-pay | Attending: Orthopedic Surgery | Admitting: Orthopedic Surgery

## 2022-10-15 ENCOUNTER — Other Ambulatory Visit: Payer: Self-pay

## 2022-10-15 ENCOUNTER — Ambulatory Visit (HOSPITAL_COMMUNITY): Payer: Self-pay | Admitting: Physician Assistant

## 2022-10-15 DIAGNOSIS — X58XXXA Exposure to other specified factors, initial encounter: Secondary | ICD-10-CM | POA: Insufficient documentation

## 2022-10-15 DIAGNOSIS — S52571A Other intraarticular fracture of lower end of right radius, initial encounter for closed fracture: Secondary | ICD-10-CM | POA: Insufficient documentation

## 2022-10-15 DIAGNOSIS — I10 Essential (primary) hypertension: Secondary | ICD-10-CM

## 2022-10-15 DIAGNOSIS — Z7984 Long term (current) use of oral hypoglycemic drugs: Secondary | ICD-10-CM

## 2022-10-15 DIAGNOSIS — Z794 Long term (current) use of insulin: Secondary | ICD-10-CM | POA: Insufficient documentation

## 2022-10-15 DIAGNOSIS — E1142 Type 2 diabetes mellitus with diabetic polyneuropathy: Secondary | ICD-10-CM | POA: Insufficient documentation

## 2022-10-15 DIAGNOSIS — S52501A Unspecified fracture of the lower end of right radius, initial encounter for closed fracture: Secondary | ICD-10-CM | POA: Insufficient documentation

## 2022-10-15 DIAGNOSIS — E1140 Type 2 diabetes mellitus with diabetic neuropathy, unspecified: Secondary | ICD-10-CM

## 2022-10-15 DIAGNOSIS — E1165 Type 2 diabetes mellitus with hyperglycemia: Secondary | ICD-10-CM | POA: Insufficient documentation

## 2022-10-15 DIAGNOSIS — Z79899 Other long term (current) drug therapy: Secondary | ICD-10-CM | POA: Insufficient documentation

## 2022-10-15 DIAGNOSIS — L03115 Cellulitis of right lower limb: Secondary | ICD-10-CM

## 2022-10-15 HISTORY — DX: Essential (primary) hypertension: I10

## 2022-10-15 HISTORY — PX: ORIF WRIST FRACTURE: SHX2133

## 2022-10-15 HISTORY — DX: Myoneural disorder, unspecified: G70.9

## 2022-10-15 LAB — GLUCOSE, CAPILLARY
Glucose-Capillary: 195 mg/dL — ABNORMAL HIGH (ref 70–99)
Glucose-Capillary: 84 mg/dL (ref 70–99)
Glucose-Capillary: 50 mg/dL — ABNORMAL LOW (ref 70–99)
Glucose-Capillary: 103 mg/dL — ABNORMAL HIGH (ref 70–99)
Glucose-Capillary: 156 mg/dL — ABNORMAL HIGH (ref 70–99)

## 2022-10-15 LAB — BASIC METABOLIC PANEL
Anion gap: 11 (ref 5–15)
BUN: 14 mg/dL (ref 6–20)
CO2: 17 mmol/L — ABNORMAL LOW (ref 22–32)
Calcium: 9 mg/dL (ref 8.9–10.3)
Chloride: 106 mmol/L (ref 98–111)
Creatinine, Ser: 0.72 mg/dL (ref 0.44–1.00)
GFR, Estimated: >60 mL/min (ref >60)
Glucose, Bld: 214 mg/dL — ABNORMAL HIGH (ref 70–99)
Potassium: 3.9 mmol/L (ref 3.5–5.1)
Sodium: 134 mmol/L — ABNORMAL LOW (ref 135–145)

## 2022-10-15 LAB — CBC
HCT: 32.8 % — ABNORMAL LOW (ref 36.0–46.0)
Hemoglobin: 11 g/dL — ABNORMAL LOW (ref 12.0–15.0)
MCH: 29.3 pg (ref 26.0–34.0)
MCHC: 33.5 g/dL (ref 30.0–36.0)
MCV: 87.5 fL (ref 80.0–100.0)
Platelets: 393 10*3/uL (ref 150–400)
RBC: 3.75 MIL/uL — ABNORMAL LOW (ref 3.87–5.11)
RDW: 13.5 % (ref 11.5–15.5)
WBC: 12.9 10*3/uL — ABNORMAL HIGH (ref 4.0–10.5)
nRBC: 0 % (ref 0.0–0.2)

## 2022-10-15 LAB — POCT PREGNANCY, URINE: Preg Test, Ur: NEGATIVE

## 2022-10-15 SURGERY — OPEN REDUCTION INTERNAL FIXATION (ORIF) WRIST FRACTURE
Anesthesia: Monitor Anesthesia Care | Site: Wrist | Laterality: Right

## 2022-10-15 MED ORDER — FENTANYL CITRATE (PF) 100 MCG/2ML IJ SOLN: 50.0000 ug | Freq: Once | INTRAMUSCULAR | Status: AC

## 2022-10-15 MED ORDER — ONDANSETRON HCL 4 MG/2ML IJ SOLN: 4.0000 mg | Freq: Once | INTRAMUSCULAR | Status: DC | PRN

## 2022-10-15 MED ORDER — HYDROMORPHONE HCL 1 MG/ML IJ SOLN: 0.2500 mg | INTRAMUSCULAR | Status: DC | PRN

## 2022-10-15 MED ORDER — OXYCODONE HCL 5 MG PO TABS: 5.0000 mg | ORAL_TABLET | Freq: Once | ORAL | Status: DC | PRN

## 2022-10-15 MED ORDER — ACETAMINOPHEN 500 MG PO TABS
1000.0000 mg | ORAL_TABLET | Freq: Once | ORAL | Status: AC
  Administered 2022-10-15: 1000 mg via ORAL
  Filled 2022-10-15: qty 2

## 2022-10-15 MED ORDER — DEXTROSE 50 % IV SOLN
INTRAVENOUS | Status: AC
  Filled 2022-10-15: qty 50

## 2022-10-15 MED ORDER — POVIDONE-IODINE 7.5 % EX SOLN: Freq: Once | CUTANEOUS | Status: DC

## 2022-10-15 MED ORDER — CEFAZOLIN SODIUM-DEXTROSE 2-4 GM/100ML-% IV SOLN
2.0000 g | INTRAVENOUS | Status: AC
  Administered 2022-10-15: 2 g via INTRAVENOUS
  Filled 2022-10-15: qty 100

## 2022-10-15 MED ORDER — INSULIN ASPART 100 UNIT/ML IJ SOLN
INTRAMUSCULAR | Status: AC
  Administered 2022-10-15: 4 [IU] via SUBCUTANEOUS
  Filled 2022-10-15: qty 1

## 2022-10-15 MED ORDER — PROPOFOL 500 MG/50ML IV EMUL
INTRAVENOUS | Status: DC | PRN
  Administered 2022-10-15: 100 ug/kg/min via INTRAVENOUS

## 2022-10-15 MED ORDER — DOCUSATE SODIUM 100 MG PO CAPS
100.0000 mg | ORAL_CAPSULE | Freq: Two times a day (BID) | ORAL | 0 refills | Status: DC
  Filled 2022-10-15: qty 10, 5d supply, fill #0

## 2022-10-15 MED ORDER — MIDAZOLAM HCL 2 MG/2ML IJ SOLN
INTRAMUSCULAR | Status: AC
  Administered 2022-10-15: 1 mg via INTRAVENOUS
  Filled 2022-10-15: qty 2

## 2022-10-15 MED ORDER — MIDAZOLAM HCL 2 MG/2ML IJ SOLN: 1.0000 mg | Freq: Once | INTRAMUSCULAR | Status: AC

## 2022-10-15 MED ORDER — MIDAZOLAM HCL 2 MG/2ML IJ SOLN
INTRAMUSCULAR | Status: AC
  Filled 2022-10-15: qty 2

## 2022-10-15 MED ORDER — OXYCODONE HCL 5 MG/5ML PO SOLN: 5.0000 mg | Freq: Once | ORAL | Status: DC | PRN

## 2022-10-15 MED ORDER — AMISULPRIDE (ANTIEMETIC) 5 MG/2ML IV SOLN: 10.0000 mg | Freq: Once | INTRAVENOUS | Status: DC | PRN

## 2022-10-15 MED ORDER — PHENYLEPHRINE HCL (PRESSORS) 10 MG/ML IV SOLN
INTRAVENOUS | Status: DC | PRN
  Administered 2022-10-15: 160 ug via INTRAVENOUS

## 2022-10-15 MED ORDER — LACTATED RINGERS IV SOLN: INTRAVENOUS | Status: DC

## 2022-10-15 MED ORDER — FENTANYL CITRATE (PF) 100 MCG/2ML IJ SOLN
INTRAMUSCULAR | Status: AC
  Administered 2022-10-15: 50 ug via INTRAVENOUS
  Filled 2022-10-15: qty 2

## 2022-10-15 MED ORDER — DEXTROSE 50 % IV SOLN
25.0000 mL | Freq: Once | INTRAVENOUS | Status: AC
  Administered 2022-10-15: 25 mL via INTRAVENOUS

## 2022-10-15 MED ORDER — OXYCODONE-ACETAMINOPHEN 5-325 MG PO TABS
1.0000 | ORAL_TABLET | ORAL | 0 refills | Status: AC | PRN
  Filled 2022-10-15: qty 28, 5d supply, fill #0

## 2022-10-15 MED ORDER — 0.9 % SODIUM CHLORIDE (POUR BTL) OPTIME
TOPICAL | Status: DC | PRN
  Administered 2022-10-15: 1000 mL

## 2022-10-15 MED ORDER — MIDAZOLAM HCL 2 MG/2ML IJ SOLN
INTRAMUSCULAR | Status: DC | PRN
  Administered 2022-10-15: 2 mg via INTRAVENOUS

## 2022-10-15 MED ORDER — CHLORHEXIDINE GLUCONATE 0.12 % MT SOLN
15.0000 mL | OROMUCOSAL | Status: DC
  Filled 2022-10-15 (×2): qty 15

## 2022-10-15 MED ORDER — INSULIN ASPART 100 UNIT/ML IJ SOLN: 0.0000 [IU] | INTRAMUSCULAR | Status: DC | PRN

## 2022-10-15 SURGICAL SUPPLY — 66 items
BAG COUNTER SPONGE SURGICOUNT (BAG) ×1 IMPLANT
BAG SPNG CNTER NS LX DISP (BAG)
BIT DRILL 2.2 SS TIBIAL (BIT) IMPLANT
BLADE CLIPPER SURG (BLADE) IMPLANT
BNDG CMPR 9X4 STRL LF SNTH (GAUZE/BANDAGES/DRESSINGS) ×1
BNDG ELASTIC 3X5.8 VLCR STR LF (GAUZE/BANDAGES/DRESSINGS) ×1 IMPLANT
BNDG ELASTIC 4X5.8 VLCR STR LF (GAUZE/BANDAGES/DRESSINGS) ×1 IMPLANT
BNDG ESMARK 4X9 LF (GAUZE/BANDAGES/DRESSINGS) ×1 IMPLANT
BNDG GAUZE DERMACEA FLUFF 4 (GAUZE/BANDAGES/DRESSINGS) ×1 IMPLANT
BNDG GZE DERMACEA 4 6PLY (GAUZE/BANDAGES/DRESSINGS) ×1
CORD BIPOLAR FORCEPS 12FT (ELECTRODE) ×1 IMPLANT
COVER SURGICAL LIGHT HANDLE (MISCELLANEOUS) ×1 IMPLANT
CUFF TOURN SGL QUICK 18X4 (TOURNIQUET CUFF) ×1 IMPLANT
CUFF TOURN SGL QUICK 24 (TOURNIQUET CUFF)
CUFF TRNQT CYL 24X4X16.5-23 (TOURNIQUET CUFF) IMPLANT
DRAIN TLS ROUND 10FR (DRAIN) IMPLANT
DRAPE OEC MINIVIEW 54X84 (DRAPES) ×1 IMPLANT
DRAPE SURG 17X11 SM STRL (DRAPES) ×1 IMPLANT
DRSG ADAPTIC 3X8 NADH LF (GAUZE/BANDAGES/DRESSINGS) ×1 IMPLANT
DURAPREP 26ML APPLICATOR (WOUND CARE) IMPLANT
ELECT REM PT RETURN 9FT ADLT (ELECTROSURGICAL)
ELECTRODE REM PT RTRN 9FT ADLT (ELECTROSURGICAL) IMPLANT
GAUZE SPONGE 4X4 12PLY STRL (GAUZE/BANDAGES/DRESSINGS) ×1 IMPLANT
GLOVE BIOGEL PI IND STRL 8.5 (GLOVE) ×1 IMPLANT
GLOVE SURG ORTHO 8.0 STRL STRW (GLOVE) ×1 IMPLANT
GOWN STRL REUS W/ TWL LRG LVL3 (GOWN DISPOSABLE) ×3 IMPLANT
GOWN STRL REUS W/ TWL XL LVL3 (GOWN DISPOSABLE) ×1 IMPLANT
GOWN STRL REUS W/TWL LRG LVL3 (GOWN DISPOSABLE) ×1
GOWN STRL REUS W/TWL XL LVL3 (GOWN DISPOSABLE) ×1
K-WIRE 1.6 (WIRE) ×1
K-WIRE FX5X1.6XNS BN SS (WIRE) ×1
KIT BASIN OR (CUSTOM PROCEDURE TRAY) ×1 IMPLANT
KIT TURNOVER KIT B (KITS) ×1 IMPLANT
KWIRE FX5X1.6XNS BN SS (WIRE) IMPLANT
MANIFOLD NEPTUNE II (INSTRUMENTS) ×1 IMPLANT
NDL HYPO 25X1 1.5 SAFETY (NEEDLE) ×1 IMPLANT
NEEDLE HYPO 25X1 1.5 SAFETY (NEEDLE) ×1 IMPLANT
NS IRRIG 1000ML POUR BTL (IV SOLUTION) ×1 IMPLANT
PACK ORTHO EXTREMITY (CUSTOM PROCEDURE TRAY) ×1 IMPLANT
PAD ARMBOARD 7.5X6 YLW CONV (MISCELLANEOUS) ×2 IMPLANT
PAD CAST 4YDX4 CTTN HI CHSV (CAST SUPPLIES) ×1 IMPLANT
PADDING CAST COTTON 4X4 STRL (CAST SUPPLIES) ×1
PEG LOCKING SMOOTH 2.2X16 (Screw) IMPLANT
PEG LOCKING SMOOTH 2.2X18 (Peg) IMPLANT
PEG LOCKING SMOOTH 2.2X20 (Screw) IMPLANT
PLATE NARROW DVR RIGHT (Plate) IMPLANT
SCREW LOCK 12X2.7X 3 LD (Screw) IMPLANT
SCREW LOCK 14X2.7X 3 LD TPR (Screw) IMPLANT
SCREW LOCK 16X2.7X 3 LD TPR (Screw) IMPLANT
SCREW LOCKING 2.7X12MM (Screw) ×2 IMPLANT
SCREW LOCKING 2.7X13MM (Screw) IMPLANT
SCREW LOCKING 2.7X14 (Screw) ×2 IMPLANT
SCREW LOCKING 2.7X16 (Screw) ×1 IMPLANT
SCREW MULTI DIRECTIONAL 2.7X16 (Screw) IMPLANT
SLING ARM FOAM STRAP MED (SOFTGOODS) IMPLANT
SOAP 2 % CHG 4 OZ (WOUND CARE) ×1 IMPLANT
SPLINT FIBERGLASS 3X35 (CAST SUPPLIES) IMPLANT
SUT PROLENE 4 0 PS 2 18 (SUTURE) IMPLANT
SUT VIC AB 2-0 FS1 27 (SUTURE) IMPLANT
SUT VICRYL 4-0 PS2 18IN ABS (SUTURE) IMPLANT
SYR CONTROL 10ML LL (SYRINGE) IMPLANT
SYSTEM CHEST DRAIN TLS 7FR (DRAIN) IMPLANT
TOWEL GREEN STERILE (TOWEL DISPOSABLE) ×1 IMPLANT
TOWEL GREEN STERILE FF (TOWEL DISPOSABLE) ×1 IMPLANT
TUBE CONNECTING 12X1/4 (SUCTIONS) ×1 IMPLANT
WATER STERILE IRR 1000ML POUR (IV SOLUTION) ×1 IMPLANT

## 2022-10-15 NOTE — Op Note (Addendum)
PREOPERATIVE DIAGNOSIS: Right wrist intra-articular distal radius fracture 3 more fragments  POSTOPERATIVE DIAGNOSIS: Same  ATTENDING SURGEON: Dr. Iran Planas who scrubbed and present for the entire procedure  ASSISTANT SURGEON: None  ANESTHESIA: Regional with IV sedation  OPERATIVE PROCEDURE: Open reduction internal fixation displaced right wrist intra-articular distal radius fracture 3 more fragments Right brachioradialis tendon tenotomy tendon release Radiographs 3 views right wrist  IMPLANTS: [employer] DVR cross lock  EBL: Minimal  RADIOGRAPHIC INTERPRETATION: AP lateral oblique views of the wrist do show the volar plate fixation in place with good position with good restoration of radial height inclination and tilt  SURGICAL INDICATIONS: Patient is a right-hand-dominant female who is referred to the office from the ER after sustaining a fall on an outstretched right wrist.  Patient was seen and evaluated the office and recommend undergo the above procedure.  Risks of surgery include but not limited to bleeding infection damage nearby nerves arteries or tendons loss of motion of the wrist and digits incomplete relief of symptoms and need for further surgical invention.  Signed informed consent was obtained on the day of surgery.  SURGICAL TECHNIQUE: The patient was prepped identified in the preoperative holding area marked apart a marker made on the right wrist indicate correct operative site.  The patient brought back to operating placed supine on the anesthesia table where the regional anesthetic was administered.  Patient tolerated this well.  A well-padded tourniquet placed on the right brachium and sealed with the appropriate drape.  The right upper extremities then prepped and draped normal sterile fashion.  Timeout was called the correct site identified procedure then began.  Attention then turned to the right wrist.  Longitude incision made directly over the FCR sheath.  Preoperative  antibiotics were given prior to skin incision.  Dissection carried down through the skin and subcutaneous tissue.  The FCR sheath was opened proximally distally.  Going through the floor the FCR sheath the FPL was then carefully identified and protected.  The pronator quadratus was elevated in an L-shaped fashion and the fracture site was then exposed.  In order to reduce the radial column separate intervention was then undertaken to release the brachial radialis off the distal radial styloid.  Tendon tenotomy was then carried out carefully protecting the first dorsal compartment tendons.  Once this was undertaken open reduction was then performed.  This was an intra-articular fracture 3 more fragments.  Open reduction was then performed and the volar plate was then applied.  It was held distally with a K wire.  Plate position was then confirmed.  The oblong screw hole was then placed proximally.  Plate height was appropriately adjusted.  Following this distal fixation was then carried out from an ulnar to radial direction with the distal locking pegs and screws.  Following this final shaft fixation was then carried out with combination of locking nonlocking screws within the radial shaft.  The wound was then thoroughly irrigated.  Final radiographs were then obtained.  The pronator quadratus was then closed with 2-0 Vicryl.  Subcutaneous tissues closed with 4-0 Monocryl and skin closed with simple Prolene suture.  Adaptic dressing sterile compressive bandage then applied.  The patient was then placed in a well-padded sugar-tong splint taken recovery in good condition.  POSTOPERATIVE PLAN: Patient be discharged to home.  See him back in the office in approximately 2 weeks for wound check suture removal transition of short arm cast for 2 weeks then begin an outpatient therapy regimen the 4-week  mark.  Radiographs at each visit

## 2022-10-15 NOTE — Anesthesia Postprocedure Evaluation (Signed)
Anesthesia Post Note  Patient: Michele Morales  Procedure(s) Performed: OPEN REDUCTION INTERNAL FIXATION (ORIF) WRIST FRACTURE (Right: Wrist)     Patient location during evaluation: PACU Anesthesia Type: Regional Level of consciousness: awake and alert Pain management: pain level controlled Vital Signs Assessment: post-procedure vital signs reviewed and stable Respiratory status: spontaneous breathing, nonlabored ventilation, respiratory function stable and patient connected to nasal cannula oxygen Cardiovascular status: stable and blood pressure returned to baseline Postop Assessment: no apparent nausea or vomiting Anesthetic complications: no  No notable events documented.  Last Vitals:  Vitals:   10/15/22 1615 10/15/22 1630  BP: (!) 140/84 130/85  Pulse: 91 96  Resp: 13 12  Temp:  37.1 C  SpO2: 100% 100%    Last Pain:  Vitals:   10/15/22 1630  TempSrc:   PainSc: 0-No pain                 Barnet Glasgow

## 2022-10-15 NOTE — Discharge Instructions (Signed)
KEEP BANDAGE CLEAN AND DRY CALL OFFICE FOR F/U APPT [phone] in 15 days [facility] KEEP HAND ELEVATED ABOVE HEART OK TO APPLY ICE TO OPERATIVE AREA CONTACT OFFICE IF ANY WORSENING PAIN OR CONCERNS.

## 2022-10-15 NOTE — Progress Notes (Signed)
Patient CBG 50, 1/2 amp Dextrose given by Dwaine Deter, CRNA per protocol. Anesthesiologist made aware.

## 2022-10-15 NOTE — Progress Notes (Signed)
Spanish Interpreter Jerlyn Ly [phone]

## 2022-10-15 NOTE — Anesthesia Procedure Notes (Signed)
Procedure Name: MAC Date/Time: 10/15/2022 2:31 PM  Performed by: Darletta Moll, CRNAPre-anesthesia Checklist: Patient identified, Emergency Drugs available, Suction available and Patient being monitored Patient Re-evaluated:Patient Re-evaluated prior to induction Oxygen Delivery Method: Nasal cannula

## 2022-10-15 NOTE — Transfer of Care (Signed)
Immediate Anesthesia Transfer of Care Note  Patient: Michele Morales  Procedure(s) Performed: OPEN REDUCTION INTERNAL FIXATION (ORIF) WRIST FRACTURE (Right: Wrist)  Patient Location: PACU  Anesthesia Type:MAC combined with regional for post-op pain  Level of Consciousness: drowsy  Airway & Oxygen Therapy: Patient Spontanous Breathing and Patient connected to nasal cannula oxygen  Post-op Assessment: Report given to RN and Post -op Vital signs reviewed and stable  Post vital signs: Reviewed and stable  Last Vitals:  Vitals Value Taken Time  BP 103/71 10/15/22 1518  Temp    Pulse 79 10/15/22 1520  Resp 11 10/15/22 1520  SpO2 99 % 10/15/22 1520  Vitals shown include unvalidated device data.  Last Pain:  Vitals:   10/15/22 1114  TempSrc:   PainSc: 0-No pain      Patients Stated Pain Goal: 0 ([id number])  Complications: No notable events documented.

## 2022-10-15 NOTE — Anesthesia Preprocedure Evaluation (Addendum)
Anesthesia Evaluation  Patient identified by MRN, date of birth, ID band Patient awake    Reviewed: Allergy & Precautions, H&P , NPO status , Patient's Chart, lab work & pertinent test results  Airway Mallampati: IV  TM Distance: >3 FB Neck ROM: Limited    Dental  (+) Teeth Intact, Dental Advisory Given   Pulmonary neg pulmonary ROS   Pulmonary exam normal breath sounds clear to auscultation       Cardiovascular hypertension (149/90, per pt normally lower than that), Pt. 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)  negative psych ROS   GI/Hepatic negative GI ROS, Neg liver ROS,,,  Endo/Other  diabetes, Poorly Controlled, Type 2, Insulin Dependent, Oral Hypoglycemic Agents  A1c 11.3 FS 195 preop   Renal/GU negative Renal ROS  negative genitourinary   Musculoskeletal negative musculoskeletal ROS (+)    Abdominal   Peds negative pediatric ROS (+)  Hematology negative hematology ROS (+)   Anesthesia Other Findings Trulicity LD: [id number] ago  Overall very poor historian  Reproductive/Obstetrics negative OB ROS Urine preg neg                             Anesthesia Physical Anesthesia Plan  ASA: 3  Anesthesia Plan: MAC and Regional   Post-op Pain Management: Regional block*, Tylenol PO (pre-op)* and Toradol IV (intra-op)*   Induction:   PONV Risk Score and Plan: 2 and Propofol infusion and TIVA  Airway Management Planned: Natural Airway and Simple Face Mask  Additional Equipment: None  Intra-op Plan:   Post-operative Plan:   Informed Consent: I have reviewed the patients History and Physical, chart, labs and discussed the procedure including the risks, benefits and alternatives for the proposed anesthesia with the patient or authorized representative who has indicated his/her  understanding and acceptance.       Plan Discussed with: CRNA  Anesthesia Plan Comments:         Anesthesia Quick Evaluation

## 2022-10-16 ENCOUNTER — Encounter (HOSPITAL_COMMUNITY): Payer: Self-pay | Admitting: Orthopedic Surgery

## 2022-10-17 ENCOUNTER — Other Ambulatory Visit: Payer: Self-pay

## 2022-11-05 ENCOUNTER — Encounter (INDEPENDENT_AMBULATORY_CARE_PROVIDER_SITE_OTHER): Payer: Self-pay

## 2023-06-17 ENCOUNTER — Encounter: Payer: Self-pay | Admitting: Physician Assistant

## 2023-06-17 ENCOUNTER — Ambulatory Visit (INDEPENDENT_AMBULATORY_CARE_PROVIDER_SITE_OTHER): Payer: Self-pay | Admitting: *Deleted

## 2023-06-17 ENCOUNTER — Other Ambulatory Visit: Payer: Self-pay

## 2023-06-17 ENCOUNTER — Ambulatory Visit: Payer: Self-pay | Admitting: Physician Assistant

## 2023-06-17 VITALS — BP 144/79 | HR 103 | Ht 59.0 in | Wt 120.0 lb

## 2023-06-17 DIAGNOSIS — E1165 Type 2 diabetes mellitus with hyperglycemia: Secondary | ICD-10-CM

## 2023-06-17 DIAGNOSIS — E119 Type 2 diabetes mellitus without complications: Secondary | ICD-10-CM

## 2023-06-17 DIAGNOSIS — Z114 Encounter for screening for human immunodeficiency virus [HIV]: Secondary | ICD-10-CM

## 2023-06-17 DIAGNOSIS — Z13220 Encounter for screening for lipoid disorders: Secondary | ICD-10-CM

## 2023-06-17 DIAGNOSIS — R739 Hyperglycemia, unspecified: Secondary | ICD-10-CM

## 2023-06-17 DIAGNOSIS — R5383 Other fatigue: Secondary | ICD-10-CM

## 2023-06-17 DIAGNOSIS — Z7984 Long term (current) use of oral hypoglycemic drugs: Secondary | ICD-10-CM

## 2023-06-17 DIAGNOSIS — I10 Essential (primary) hypertension: Secondary | ICD-10-CM

## 2023-06-17 DIAGNOSIS — Z7985 Long-term (current) use of injectable non-insulin antidiabetic drugs: Secondary | ICD-10-CM

## 2023-06-17 DIAGNOSIS — Z794 Long term (current) use of insulin: Secondary | ICD-10-CM

## 2023-06-17 DIAGNOSIS — Z1159 Encounter for screening for other viral diseases: Secondary | ICD-10-CM

## 2023-06-17 DIAGNOSIS — R051 Acute cough: Secondary | ICD-10-CM

## 2023-06-17 LAB — GLUCOSE, POCT (MANUAL RESULT ENTRY): POC Glucose: 274 mg/dL — AB (ref 70–99)

## 2023-06-17 LAB — POCT GLYCOSYLATED HEMOGLOBIN (HGB A1C): Hemoglobin A1C: 15 % — AB (ref 4.0–5.6)

## 2023-06-17 MED ORDER — TRULICITY 1.5 MG/0.5ML ~~LOC~~ SOAJ
1.5000 mg | SUBCUTANEOUS | 3 refills | Status: DC
  Filled 2023-06-17: qty 2, 28d supply, fill #0

## 2023-06-17 MED ORDER — METFORMIN HCL 1000 MG PO TABS
1000.0000 mg | ORAL_TABLET | Freq: Two times a day (BID) | ORAL | 1 refills | Status: DC
  Filled 2023-06-17: qty 180, 90d supply, fill #0

## 2023-06-17 MED ORDER — AMLODIPINE BESYLATE 5 MG PO TABS
5.0000 mg | ORAL_TABLET | Freq: Every day | ORAL | 0 refills | Status: DC
  Filled 2023-06-17: qty 30, 30d supply, fill #0

## 2023-06-17 MED ORDER — CETIRIZINE HCL 10 MG PO TABS
10.0000 mg | ORAL_TABLET | Freq: Every day | ORAL | 11 refills | Status: DC
  Filled 2023-06-17: qty 30, 30d supply, fill #0
  Filled 2023-07-20 (×2): qty 30, 30d supply, fill #1

## 2023-06-17 MED ORDER — PENTIPS 32G X 4 MM MISC
3 refills | Status: AC
  Filled 2023-06-17: qty 100, 25d supply, fill #0

## 2023-06-17 MED ORDER — LISINOPRIL 10 MG PO TABS
10.0000 mg | ORAL_TABLET | Freq: Every day | ORAL | 1 refills | Status: DC
  Filled 2023-06-17: qty 30, 30d supply, fill #0
  Filled 2023-07-20 (×2): qty 30, 30d supply, fill #1

## 2023-06-17 MED ORDER — BASAGLAR KWIKPEN 100 UNIT/ML ~~LOC~~ SOPN
16.0000 [IU] | PEN_INJECTOR | Freq: Every day | SUBCUTANEOUS | 2 refills | Status: DC
  Filled 2023-06-17: qty 6, 37d supply, fill #0
  Filled 2023-07-06 – 2023-07-14 (×2): qty 6, 37d supply, fill #1

## 2023-06-17 MED ORDER — BENZONATATE 100 MG PO CAPS
ORAL_CAPSULE | ORAL | 0 refills | Status: DC
  Filled 2023-06-17: qty 20, 3d supply, fill #0

## 2023-06-17 MED ORDER — ROSUVASTATIN CALCIUM 10 MG PO TABS
10.0000 mg | ORAL_TABLET | Freq: Every day | ORAL | 2 refills | Status: AC
  Filled 2023-06-17: qty 90, 90d supply, fill #0

## 2023-06-17 NOTE — Patient Instructions (Signed)
You are going to resume your medicines for diabetes.  You are going to take metformin 1000 mg twice daily, Trulicity once a week, insulin 16 units a day.  I encourage you to continue checking your blood sugar levels once daily, keep a written log and have available for all office visits.  To help with your cough, you are going to start taking Zyrtec on a daily basis, and you can use Tessalon Perles as needed for relief.  We will call you with today's lab results.  It is very important that you maintain follow-up with your primary care provider, you should see them in the next 4 weeks, or return to the mobile unit for follow-up.  Roney Jaffe, PA-C Physician Assistant [facility] [email or web address]   Hiperglucemia Hyperglycemia La hiperglucemia ocurre cuando la cantidad de azcar, o glucosa, en la sangre es demasiado alta. El nivel alto de azcar en la sangre puede ocurrir tanto si tiene diabetes como si no tiene diabetes. Puede ser Neomia Dear emergencia. Cules son las causas? Si tiene diabetes, el Restaurant manager, fast food de azcar en la sangre puede deberse a lo siguiente: Medicamentos que Pensions consultant. No administrarse suficiente insulina (si la [place]). Ser menos activo de lo normal. [place] ms de lo previsto. Enfermedad, una lesin o una infeccin. Someterse a [place]. Estrs. Si no tiene diabetes, el Restaurant manager, fast food de azcar en la sangre puede deberse a lo siguiente: Ciertos medicamentos, como los corticoesteroides o los diurticos tiazdicos. Estrs. Una enfermedad o infeccin grave. Someterse a [place]. Enfermedades del pncreas. Qu incrementa el riesgo? Es ms probable que tenga un nivel alto de azcar en la sangre si: Alguien de su familia tiene diabetes. Tiene sobrepeso. No hace actividad fsica. Tiene o ha tenido lo siguiente: Prediabetes. Diabetes durante el embarazo. Sndrome del ovario poliqustico  (SOP). Cules son los signos o sntomas? Es posible que el nivel alto de azcar en la sangre no cause sntomas. Si tiene sntomas, pueden incluir los siguientes: Estar ms sediento que lo habitual. Necesidad de hacer pis con mayor frecuencia que lo normal. Hambre. Mucho cansancio. Visin borrosa. Puede tener otros sntomas si no se trata el nivel alto de Banker. Pueden incluir: Tree surgeon. Dolor de [place]. Debilidad. Prdida de peso no planificada. Hormigueo o adormecimiento en las manos o los pies. Cortes o moretones que tardan en curarse. Cmo se diagnostica?  El nivel alto de azcar en la sangre se diagnostica con un anlisis de [place]. Ese anlisis le indica cunta azcar hay en la sangre. Se realiza mientras tiene sntomas.  El mdico tambin puede hacerle un examen fsico, revisar sus antecedentes mdicos y hacerle ms anlisis de [place]. Estas pruebas pueden incluir lo siguiente: Prueba de la glucemia en ayunas (PGA). No puede comer durante al menos 8 horas antes de esta prueba. Anlisis de A1c. Prueba de tolerancia a la glucosa. Cmo se trata? El tratamiento puede incluir: Tomar medicamentos para Chief Operating Officer los niveles de Banker. Cambiar los medicamentos o la cantidad que se aplica si recibe insulina o toma otros medicamentos para la diabetes. Controlarse el nivel de azcar en la sangre con mayor frecuencia. Hacer cambios en su vida diaria. Pueden incluir: Ser ms activo. Consumir alimentos ms saludables. Bajar de [place]. Tratar una enfermedad o afeccin. Dejar de tomar corticoesteroides o Scientist, clinical (histocompatibility and immunogenetics). Si su nivel alto de Academic librarian, es posible que deba recibir tratamiento en el hospital. Siga estas  instrucciones en su casa: Si usted tiene diabetes:  Conozca los sntomas de nivel alto de Banker. Siga su plan de atencin de la diabetes. Asegrese de hacer lo siguiente: Aplquese la insulina y tome  los medicamentos como se lo hayan indicado. Contrlese el nivel de azcar en la sangre con la frecuencia que le hayan indicado. Coma a horario. No omita comidas. Controle su nivel de azcar en la sangre antes y despus de ejercitarse. Si hace ejercicio durante ms tiempo o con mayor esfuerzo de lo habitual, controle su nivel de azcar en la sangre con mayor frecuencia. Siga su plan para los das de enfermedad cuando no pueda comer ni beber normalmente. Elabore este plan de antemano con el mdico. Comparta su plan de atencin de la diabetes con: Sus compaeros de trabajo o de la escuela. Las personas con las que [place]. Use un brazalete o lleve consigo una tarjeta que diga que tiene diabetes. Instrucciones generales Use los medicamentos solamente como se lo haya indicado el mdico. Beba suficiente lquido para mantener el pis (orina) de color amarillo plido. Asegrese de beber suficiente lquido cuando: Realice actividad fsica. Se enferme. Est en lugares calurosos. Si bebe alcohol: Limite la cantidad que bebe a lo siguiente: De 0 a 1 medida al da si es [place]. De 0 a 2 medidas al da si es varn. Sepa cunta cantidad de alcohol hay en las bebidas que toma. En los [address], una medida es una botella de cerveza de 12 oz (355 ml), un vaso de vino de 5 oz (148 ml) o un vaso de una bebida alcohlica de alta graduacin de 1 oz (44 ml). Controle el estrs. Si necesita ayuda para lograrlo, consulte a su mdico. Haga ejercicio segn las indicaciones. Intente mantener un peso saludable. Concurra a todas las visitas de seguimiento. El mdico querr asegurarse de que se trate el nivel alto de Banker. Dnde obtener ms informacin American Diabetes Association (ADA) (Asociacin Estadounidense de la Diabetes): diabetes.org Comunquese con un mdico si: Tiene diabetes y tiene problemas para Futures trader de azcar en la sangre dentro del rango [place]. El nivel de azcar en la  sangre es igual o mayor que 240 mg/dl (56.2 mmol/dl) durante 2 das seguidos. Tiene un nivel de azcar en la sangre alto con frecuencia. Tiene signos de enfermedad, por ejemplo: Nuseas o vmitos. Dolor de [place]. Grant Ruts. No puede dejar de vomitar. Solicite ayuda de inmediato si: El monitor de azcar en la sangre indica un nivel "alto" incluso cuando se est administrando insulina. Tiene dificultad para respirar. Estos sntomas pueden Customer service manager. Llame al 911 de inmediato. No espere a ver si los sntomas desaparecen. No conduzca por sus propios medios [employer]. Esta informacin no tiene Theme park manager el consejo del mdico. Asegrese de hacerle al mdico cualquier pregunta que tenga. Document Revised: 12/17/2022 Document Reviewed: 12/17/2022 Elsevier Patient Education  2024 [employer].

## 2023-06-17 NOTE — Telephone Encounter (Signed)
Chief Complaint: shortness of breath with coughing, per daughter on face time with patient now. Elevated blood glucose  Symptoms: shortness of breath with coughing feels like something stuck in throat at times. Blood glucose 400 yesterday and today 345. Patient stopped taking po medication x 6 months ago and taking only basaglar insulin 10 units am and 10 units pm.  Frequency: Shortness of breath x 2 weeks and elevated glucose today  Pertinent Negatives: Patient denies chest pain no difficulty breathing no fever no headache no thirst no frequent urination. Disposition: [] ED /[] Urgent Care (no appt availability in office) / [] Appointment(In office/virtual)/ []  [facility]/ [] Home Care/ [] Refused Recommended Disposition /[x] [facility]/ []  Follow-up with PCP Additional Notes:   Recommended patient drink more water and go to Mobile bus now and if unable to be seen today go to ED. Please advise regarding medication management and further instructions. Reason for Disposition  [1] Blood glucose > 300 mg/dL (16.1 mmol/L) AND [0] two or more times in a row  Continuous (nonstop) coughing interferes with work, school, or sleeping  Answer Assessment - Initial Assessment Questions 1. RESPIRATORY STATUS: "Describe your breathing?" (e.g., wheezing, shortness of breath, unable to speak, severe coughing)      Shortness of breath with coughing 2. ONSET: "When did this breathing problem begin?"      2 week s  3. PATTERN "Does the difficult breathing come and go, or has it been constant since it started?"      Comes and goes  4. SEVERITY: "How bad is your breathing?" (e.g., mild, moderate, severe)    - MILD: No SOB at rest, mild SOB with walking, speaks normally in sentences, can lie down, no retractions, pulse < 100.    - MODERATE: SOB at rest, SOB with minimal exertion and prefers to sit, cannot lie down flat, speaks in phrases, mild retractions, audible wheezing, pulse 100-120.    -  SEVERE: Very SOB at rest, speaks in single words, struggling to breathe, sitting hunched forward, retractions, pulse > 120      Shortness of breath with coughing  5. RECURRENT SYMPTOM: "Have you had difficulty breathing before?" If Yes, ask: "When was the last time?" and "What happened that time?"      Na  6. CARDIAC HISTORY: "Do you have any history of heart disease?" (e.g., heart attack, angina, bypass surgery, angioplasty)      na 7. LUNG HISTORY: "Do you have any history of lung disease?"  (e.g., pulmonary embolus, asthma, emphysema)     na 8. CAUSE: "What do you think is causing the breathing problem?"      Not sure  9. OTHER SYMPTOMS: "Do you have any other symptoms? (e.g., dizziness, runny nose, cough, chest pain, fever)     Shortness of breath with cough, exhausted, chills and hot flashes. Elevated blood sugar, 400 yesterday , 345 now  10. O2 SATURATION MONITOR:  "Do you use an oxygen saturation monitor (pulse oximeter) at home?" If Yes, ask: "What is your reading (oxygen level) today?" "What is your usual oxygen saturation reading?" (e.g., 95%)       na 11. PREGNANCY: "Is there any chance you are pregnant?" "When was your last menstrual period?"       na 12. TRAVEL: "Have you traveled out of the country in the last month?" (e.g., travel history, exposures)       na  Answer Assessment - Initial Assessment Questions 1. BLOOD GLUCOSE: "What is your blood glucose level?"  345 per daughter on face time with mother  2. ONSET: "When did you check the blood glucose?"     Now  3. USUAL RANGE: "What is your glucose level usually?" (e.g., usual fasting morning value, usual evening value)     Na  4. KETONES: "Do you check for ketones (urine or blood test strips)?" If Yes, ask: "What does the test show now?"      na 5. TYPE 1 or 2:  "Do you know what type of diabetes you have?"  (e.g., Type 1, Type 2, Gestational; doesn't know)      na 6. INSULIN: "Do you take insulin?" "What type of  insulin(s) do you use? What is the mode of delivery? (syringe, pen; injection or pump)?"      Taking basaglar insulin 10 units in am and 10 units in pm  7. DIABETES PILLS: "Do you take any pills for your diabetes?" If Yes, ask: "Have you missed taking any pills recently?"     Stopped taking medication 6 months ago  8. OTHER SYMPTOMS: "Do you have any symptoms?" (e.g., fever, frequent urination, difficulty breathing, dizziness, weakness, vomiting)     Shortness of breath with coughing. Feels like something stuck in throat  9. PREGNANCY: "Is there any chance you are pregnant?" "When was your last menstrual period?"     na  Protocols used: Breathing Difficulty-A-AH, Diabetes - High Blood Sugar-A-AH

## 2023-06-17 NOTE — Telephone Encounter (Signed)
Please contact pt and schedule an appt   Michele Morales

## 2023-06-17 NOTE — Progress Notes (Unsigned)
New Patient Office Visit  Subjective    Patient ID: Lezly Tawney, female    DOB: [birth year]  Age: 47 y.o. MRN: [id number]  CC:  Chief Complaint  Patient presents with   Fatigue   Hyperglycemia    Fasting levels: Elevated, After lunch: 345   Cough     SOB when coughing     HPI Launa Flight   Right now she is just using insulin, 10 units BID  Cough for the past two weeks - dry chills ;  Tired since having cough  Cough keeping awake - hot tea - a little   Going to [facility] up with [facility]      Outpatient Encounter Medications as of 06/17/2023  Medication Sig   Blood Glucose Monitoring Suppl (TRUE METRIX METER) w/Device KIT Use to check blood sugar three times daily.   glucose blood (TRUE METRIX BLOOD GLUCOSE TEST) test strip Use to check blood sugar three times daily.   Insulin Glargine (BASAGLAR KWIKPEN) 100 UNIT/ML Inject 16 Units into the skin daily.   Insulin Pen Needle (PENTIPS) 32G X 4 MM MISC use as directed   TRUEplus Lancets 28G MISC Use to check blood sugar three times daily.   amLODipine (NORVASC) 5 MG tablet Take 1 tablet (5 mg total) by mouth daily. (Patient not taking: Reported on 06/17/2023)   docusate sodium (COLACE) 100 MG capsule Take 1 capsule (100 mg total) by mouth 2 (two) times daily. (Patient not taking: Reported on 06/17/2023)   Dulaglutide (TRULICITY) 1.5 MG/0.5ML SOPN Inject 1.5 mg into the skin once a week. (Patient not taking: Reported on 10/10/2022)   lisinopril (ZESTRIL) 10 MG tablet Take 1 tablet (10 mg total) by mouth daily. (Patient not taking: Reported on 10/10/2022)   metFORMIN (GLUCOPHAGE) 1000 MG tablet Take 1 tablet (1,000 mg total) by mouth 2 (two) times daily with a meal. (Patient not taking: Reported on 06/17/2023)   rosuvastatin (CRESTOR) 10 MG tablet Take 1 tablet (10 mg total) by mouth daily. (Patient not taking: Reported on 06/17/2023)   No facility-administered encounter medications on file as of 06/17/2023.    Past  Medical History:  Diagnosis Date   Diabetes mellitus without complication (HCC)    IDDM   Hypercholesteremia    Hypertension    Neuromuscular disorder (HCC)    diabetic neuropathy feet    Past Surgical History:  Procedure Laterality Date   hand procedure Right    right wrist - used sedation to reset in the ER Dept   ORIF WRIST FRACTURE Right 10/15/2022   Procedure: OPEN REDUCTION INTERNAL FIXATION (ORIF) WRIST FRACTURE;  Surgeon: Bradly Bienenstock, MD;  Location: [facility] OR;  Service: Orthopedics;  Laterality: Right;  regional with iv sedation    Family History  Problem Relation Age of Onset   Diabetes Mother     Social History   Socioeconomic History   Marital status: Single    Spouse name: Not on file   Number of children: Not on file   Years of education: Not on file   Highest education level: Not on file  Occupational History   Not on file  Tobacco Use   Smoking status: Never   Smokeless tobacco: Never  Vaping Use   Vaping status: Never Used  Substance and Sexual Activity   Alcohol use: No   Drug use: No   Sexual activity: Not Currently    Birth control/protection: Injection    Comment: Depro  Other Topics Concern   Not on file  Social History Narrative   Not on file   Social Determinants of Health   Financial Resource Strain: Not on file  Food Insecurity: Not on file  Transportation Needs: Not on file  Physical Activity: Not on file  Stress: Not on file  Social Connections: Not on file  Intimate Partner Violence: Not on file    ROS      Objective    BP (!) 148/80 (BP Location: Left Arm, Patient Position: Sitting, Cuff Size: Normal)   Pulse (!) 103   Ht 4\' 11"  (1.499 m)   Wt 120 lb (54.4 kg)   SpO2 99%   BMI 24.24 kg/m   Physical Exam  {Labs (Optional):23779}    Assessment & Plan:   Problem List Items Addressed This Visit   None   No follow-ups on file.   Kasandra Knudsen Mayers, PA-C

## 2023-06-18 ENCOUNTER — Other Ambulatory Visit: Payer: Self-pay

## 2023-06-18 ENCOUNTER — Encounter: Payer: Self-pay | Admitting: Physician Assistant

## 2023-06-18 DIAGNOSIS — E1165 Type 2 diabetes mellitus with hyperglycemia: Secondary | ICD-10-CM | POA: Insufficient documentation

## 2023-06-18 DIAGNOSIS — I10 Essential (primary) hypertension: Secondary | ICD-10-CM | POA: Insufficient documentation

## 2023-06-18 LAB — COMP. METABOLIC PANEL (12)
AST: 8 [IU]/L (ref 0–40)
Albumin: 3.4 g/dL — ABNORMAL LOW (ref 3.9–4.9)
Alkaline Phosphatase: 126 [IU]/L — ABNORMAL HIGH (ref 44–121)
BUN/Creatinine Ratio: 24 — ABNORMAL HIGH (ref 9–23)
BUN: 39 mg/dL — ABNORMAL HIGH (ref 6–24)
Bilirubin Total: <0.2 mg/dL (ref 0.0–1.2)
Calcium: 8.5 mg/dL — ABNORMAL LOW (ref 8.7–10.2)
Chloride: 92 mmol/L — ABNORMAL LOW (ref 96–106)
Creatinine, Ser: 1.62 mg/dL — ABNORMAL HIGH (ref 0.57–1.00)
Globulin, Total: 2.7 g/dL (ref 1.5–4.5)
Glucose: 257 mg/dL — ABNORMAL HIGH (ref 70–99)
Potassium: 4.3 mmol/L (ref 3.5–5.2)
Sodium: 128 mmol/L — ABNORMAL LOW (ref 134–144)
Total Protein: 6.1 g/dL (ref 6.0–8.5)
eGFR: 39 mL/min/{1.73_m2} — ABNORMAL LOW (ref >59)

## 2023-06-18 LAB — CBC WITH DIFFERENTIAL/PLATELET
Basophils Absolute: 0.1 10*3/uL (ref 0.0–0.2)
Basos: 0 %
EOS (ABSOLUTE): 0.2 10*3/uL (ref 0.0–0.4)
Eos: 1 %
Hematocrit: 29.1 % — ABNORMAL LOW (ref 34.0–46.6)
Hemoglobin: 9.5 g/dL — ABNORMAL LOW (ref 11.1–15.9)
Immature Grans (Abs): 0.2 10*3/uL — ABNORMAL HIGH (ref 0.0–0.1)
Immature Granulocytes: 1 %
Lymphocytes Absolute: 2.2 10*3/uL (ref 0.7–3.1)
Lymphs: 12 %
MCH: 28.8 pg (ref 26.6–33.0)
MCHC: 32.6 g/dL (ref 31.5–35.7)
MCV: 88 fL (ref 79–97)
Monocytes Absolute: 0.9 10*3/uL (ref 0.1–0.9)
Monocytes: 5 %
Neutrophils Absolute: 15.2 10*3/uL — ABNORMAL HIGH (ref 1.4–7.0)
Neutrophils: 81 %
Platelets: 580 10*3/uL — ABNORMAL HIGH (ref 150–450)
RBC: 3.3 x10E6/uL — ABNORMAL LOW (ref 3.77–5.28)
RDW: 13.4 % (ref 11.7–15.4)
WBC: 18.7 10*3/uL — ABNORMAL HIGH (ref 3.4–10.8)

## 2023-06-18 LAB — MICROALBUMIN / CREATININE URINE RATIO
Creatinine, Urine: 39.9 mg/dL
Microalb/Creat Ratio: 2424 mg/g{creat} — ABNORMAL HIGH (ref 0–29)
Microalbumin, Urine: 967.3 ug/mL

## 2023-06-18 LAB — TSH: TSH: 0.934 u[IU]/mL (ref 0.450–4.500)

## 2023-06-18 LAB — LIPID PANEL
Chol/HDL Ratio: 5.4 ratio — ABNORMAL HIGH (ref 0.0–4.4)
Cholesterol, Total: 178 mg/dL (ref 100–199)
HDL: 33 mg/dL — ABNORMAL LOW (ref >39)
LDL Chol Calc (NIH): 94 mg/dL (ref 0–99)
Triglycerides: 303 mg/dL — ABNORMAL HIGH (ref 0–149)
VLDL Cholesterol Cal: 51 mg/dL — ABNORMAL HIGH (ref 5–40)

## 2023-06-18 LAB — HCV AB W REFLEX TO QUANT PCR: HCV Ab: NONREACTIVE

## 2023-06-18 LAB — HCV INTERPRETATION

## 2023-06-18 NOTE — Telephone Encounter (Signed)
Per provider since pt was seen at mobile push appt out with her 3 months and first available with luke

## 2023-06-24 ENCOUNTER — Ambulatory Visit (INDEPENDENT_AMBULATORY_CARE_PROVIDER_SITE_OTHER): Payer: Self-pay | Admitting: Primary Care

## 2023-07-06 ENCOUNTER — Other Ambulatory Visit: Payer: Self-pay

## 2023-07-07 ENCOUNTER — Other Ambulatory Visit: Payer: Self-pay

## 2023-07-14 ENCOUNTER — Other Ambulatory Visit: Payer: Self-pay

## 2023-07-15 NOTE — Progress Notes (Signed)
S:     No chief complaint on file.  47 y.o. female who presents for diabetes evaluation, education, and management. PMH is significant for T2DM, HTN, and nephropathy.   Patient was referred and last seen by Provider, Maurene Capes, on 06/17/2023. At that visit A1c was 15%. Uacr was 2,424 mg/g. Patient was initially started on metformin 1000 mg BID, Basaglar 16 units daily, and Trulicity 1.5 mg weekly. Later when patients labs came back metformin was discontinued due to decrease in kidney function and insulin was increased to 20 units bid.  Patient arrives in good spirits and presents without any assistance. Patient is accompanied by daughter. Daughter is the translator today. Patient denies clinic translator. Patient complains of lower extremity edema that correlates with the initiation of amlodipine 5 mg daily. Patient reports she did not start the Trulicity 1.5 mg weekly. Patient reports she takes Basaglar 20 mg BID. Patient denies hypoglycemia symptoms (dizziness/shakiness). Endorses tingling sensation in her feet. Patient reports she needs test strips and lancets.   Patient reports Diabetes is longstanding.   Family/Social History:  Fhx: DM Tobacco: never smoker  Alcohol: none reported   Current diabetes medications include: Basaglar 20 units BID, Trulicity 1.5 mg weekly Current hypertension medications include: Amlodipine 5 mg daily, Lisinopril 10 mg daily  Current hyperlipidemia medications include: Rosuvastatin 10 mg daily  Patient reports adherence to taking all medications as prescribed.   Insurance coverage: none   Patient denies hypoglycemic events.  Patient denies nocturia (nighttime urination).  Patient reports neuropathy (nerve pain). Patient denies visual changes. Patient reports self foot exams.   Patient reported dietary habits: Eats 3 meals/day Breakfast: eggs, yogurts, coffee (with milk and 1 sugar packet),toast Lunch: 3 corn tortillas, chicken, pork Dinner:  cereal, yogurt, tea, coffee Drinks: diet coke, water, zero sugar gatorade   Patient-reported exercise habits: not discussed today  O:  7 day average blood glucose: 121 14 day average blood glucose: 120 30 day average blood glucose: 209  11/20: 147 @ 7:46 am 11/19: 139 @ 01:13 pm 11/18: 126 @ 0800 am 11/17:87 @ 09:57 am 11/16: 92 @ 9:42 am 11/15: 135 @ 07:53 am  Lab Results  Component Value Date   HGBA1C 15.0 (A) 06/17/2023   There were no vitals filed for this visit.  Lipid Panel     Component Value Date/Time   CHOL 178 06/17/2023 1559   TRIG 303 (H) 06/17/2023 1559   HDL 33 (L) 06/17/2023 1559   CHOLHDL 5.4 (H) 06/17/2023 1559   LDLCALC 94 06/17/2023 1559    Clinical Atherosclerotic Cardiovascular Disease (ASCVD): No  The 10-year ASCVD risk score (Arnett DK, et al., 2019) is: 5.4%   Values used to calculate the score:     Age: 17 years     Sex: Female     Is Non-Hispanic African American: No     Diabetic: Yes     Tobacco smoker: No     Systolic Blood Pressure: 144 mmHg     Is BP treated: Yes     HDL Cholesterol: 33 mg/dL     Total Cholesterol: 178 mg/dL   A/P: Diabetes longstanding currently uncontrolled. Patient is able to verbalize appropriate hypoglycemia management plan. Medication adherence appears optimal. Control is suboptimal due to patient being lost to follow up for almost a year. Metformin was discontinued due to decreased kidney function. Trulicity dose was adjusted to 0.75 mg since patient reported she has not started her Trulicity 1.5 mg dose. Basaglar was adjusted  to 26 units daily to avoid hypoglycemia with the initiation of Trulicity 0.75 mg weekly.  -Adjusted dose of basal insulin Basaglar from 20 units BID to 26 units daily in the morning -Adjusted dose of GLP-1 Trulicity (dulaglutide) from 1.5 mg to 0.75 mg weekly -Discontinued metformin 1000 mg BID.  -Lab today- CMP+EGFR -Patient educated on purpose, proper use, and potential adverse effects of  medications.  -Extensively discussed pathophysiology of diabetes, recommended lifestyle interventions, dietary effects on blood sugar control.  -Counseled on s/sx of and management of hypoglycemia.  -Next A1c anticipated 08/2023.   ASCVD risk - primary prevention in patient with diabetes. Last LDL is 94 mg/dL not at goal of <865  mg/dL. ASCVD risk factors include T2DM, HTN  and 10-year ASCVD risk score of 5.7%.  -Continued Rosuvastatin 10 mg.   Hypertension longstanding currently uncontrolled at 149/81, pulse: 106. Blood pressure goal of <130/80  mmHg. Medication adherence appears optimal. Blood pressure control is suboptimal due to patient being lost to follow up. Patient has a history of LE swelling with Amlodipine 10 mg daily and today patient complained of LE swelling on Amlodipine 5 mg daily. Labs were done today to check on kidney function. Patient was informed she will receive a call about any additional medication adjustments for her BP. -Discontinued Amlodipine 5 mg daily. -Continued Lisinopril 10 mg daily.  Written patient instructions provided. Patient verbalized understanding of treatment plan.  Total time in face to face counseling 30 minutes.    Follow-up:  Pharmacist: 08/17/2023 PCP clinic visit: 09/16/2023  Patient seen with: Erasmo Leventhal, PharmD Candidate  [employer] SOP   Butch Penny, PharmD, [place], CPP Clinical Pharmacist [facility] [phone]

## 2023-07-16 ENCOUNTER — Ambulatory Visit: Payer: Self-pay | Attending: Physician Assistant | Admitting: Pharmacist

## 2023-07-16 ENCOUNTER — Encounter: Payer: Self-pay | Admitting: Pharmacist

## 2023-07-16 ENCOUNTER — Other Ambulatory Visit: Payer: Self-pay

## 2023-07-16 VITALS — BP 149/81 | HR 106

## 2023-07-16 DIAGNOSIS — E0840 Diabetes mellitus due to underlying condition with diabetic neuropathy, unspecified: Secondary | ICD-10-CM

## 2023-07-16 DIAGNOSIS — E1165 Type 2 diabetes mellitus with hyperglycemia: Secondary | ICD-10-CM

## 2023-07-16 DIAGNOSIS — Z7985 Long-term (current) use of injectable non-insulin antidiabetic drugs: Secondary | ICD-10-CM

## 2023-07-16 DIAGNOSIS — Z794 Long term (current) use of insulin: Secondary | ICD-10-CM

## 2023-07-16 DIAGNOSIS — E119 Type 2 diabetes mellitus without complications: Secondary | ICD-10-CM

## 2023-07-16 MED ORDER — TRULICITY 0.75 MG/0.5ML ~~LOC~~ SOAJ
0.7500 mg | SUBCUTANEOUS | 1 refills | Status: DC
  Filled 2023-07-16: qty 2, 28d supply, fill #0

## 2023-07-16 MED ORDER — TRUE METRIX BLOOD GLUCOSE TEST VI STRP
ORAL_STRIP | 2 refills | Status: AC
  Filled 2023-07-16: qty 100, 33d supply, fill #0

## 2023-07-16 MED ORDER — BASAGLAR KWIKPEN 100 UNIT/ML ~~LOC~~ SOPN
26.0000 [IU] | PEN_INJECTOR | Freq: Every day | SUBCUTANEOUS | 2 refills | Status: DC
  Filled 2023-07-16 (×2): qty 9, 34d supply, fill #0

## 2023-07-16 MED ORDER — TRUEPLUS LANCETS 28G MISC
2 refills | Status: AC
  Filled 2023-07-16: qty 100, 33d supply, fill #0

## 2023-07-17 LAB — CMP14+EGFR
ALT: 10 [IU]/L (ref 0–32)
AST: 9 [IU]/L (ref 0–40)
Albumin: 3.9 g/dL (ref 3.9–4.9)
Alkaline Phosphatase: 104 [IU]/L (ref 44–121)
BUN/Creatinine Ratio: 26 — ABNORMAL HIGH (ref 9–23)
BUN: 39 mg/dL — ABNORMAL HIGH (ref 6–24)
Bilirubin Total: 0.2 mg/dL (ref 0.0–1.2)
CO2: 16 mmol/L — ABNORMAL LOW (ref 20–29)
Calcium: 9.1 mg/dL (ref 8.7–10.2)
Chloride: 105 mmol/L (ref 96–106)
Creatinine, Ser: 1.49 mg/dL — ABNORMAL HIGH (ref 0.57–1.00)
Globulin, Total: 3 g/dL (ref 1.5–4.5)
Glucose: 191 mg/dL — ABNORMAL HIGH (ref 70–99)
Potassium: 5.1 mmol/L (ref 3.5–5.2)
Sodium: 137 mmol/L (ref 134–144)
Total Protein: 6.9 g/dL (ref 6.0–8.5)
eGFR: 43 mL/min/{1.73_m2} — ABNORMAL LOW (ref >59)

## 2023-07-20 ENCOUNTER — Other Ambulatory Visit: Payer: Self-pay

## 2023-07-21 ENCOUNTER — Other Ambulatory Visit: Payer: Self-pay

## 2023-08-17 ENCOUNTER — Ambulatory Visit: Payer: Self-pay | Attending: Primary Care | Admitting: Pharmacist

## 2023-08-17 ENCOUNTER — Other Ambulatory Visit: Payer: Self-pay

## 2023-08-17 ENCOUNTER — Encounter: Payer: Self-pay | Admitting: Pharmacist

## 2023-08-17 DIAGNOSIS — Z794 Long term (current) use of insulin: Secondary | ICD-10-CM

## 2023-08-17 DIAGNOSIS — E1165 Type 2 diabetes mellitus with hyperglycemia: Secondary | ICD-10-CM

## 2023-08-17 DIAGNOSIS — I10 Essential (primary) hypertension: Secondary | ICD-10-CM

## 2023-08-17 MED ORDER — TRULICITY 1.5 MG/0.5ML ~~LOC~~ SOAJ
1.5000 mg | SUBCUTANEOUS | 1 refills | Status: DC
  Filled 2023-08-17: qty 2, 28d supply, fill #0

## 2023-08-17 MED ORDER — HYDROCHLOROTHIAZIDE 12.5 MG PO CAPS
12.5000 mg | ORAL_CAPSULE | Freq: Every day | ORAL | 1 refills | Status: DC
  Filled 2023-08-17: qty 60, 60d supply, fill #0

## 2023-08-17 NOTE — Progress Notes (Signed)
S:     No chief complaint on file.  47 y.o. female who presents for diabetes evaluation, education, and management. PMH is significant for T2DM, HTN, and nephropathy.   Patient was referred and last seen by Provider, Maurene Capes, on 06/17/2023. At that visit A1c was 15%. Uacr was 2,424 mg/g. Patient was initially started on metformin 1000 mg BID, Basaglar 16 units daily, and Trulicity 1.5 mg weekly. Later when patients labs came back metformin was discontinued due to decrease in kidney function and insulin was increased to 20 units bid.  Pharmacy saw her on 07/16/2023. Pt had not started the Trulicity and home blood sugars showed improvement without having taken it. Therefore, we had start Trulicity but at a reduced dose of 1.5 mg weekly. Of note, pt's 14-day CBG home avg was 120 mg/dL at that point. We changed her Basaglar to 26u once daily and started her on Trulicity 0.75 mg weekly. Labs at that appt showed improvement in renal function.   Patient arrives in good spirits and presents without any assistance. Patient is accompanied by daughter. Daughter is the translator today. Patient reports Diabetes is longstanding. She's doing well since last visit. Denies any NV, abdominal pain associated with Trulicity. No changes in vision. Regarding her BP, she stopped the amlodipine and LE edema has resolved. Still compliant with lisinopril 10 mg daily. Has not been able to check BP at home.   Family/Social History:  Fhx: DM Tobacco: never smoker  Alcohol: none reported   Current diabetes medications include: Basaglar 20 units once daily, Trulicity 0.75 mg weekly Current hypertension medications include: Lisinopril 10 mg daily  Current hyperlipidemia medications include: Rosuvastatin 10 mg daily Patient reports adherence to taking all medications as prescribed.   Insurance coverage: none   Patient denies hypoglycemic events.  Patient denies nocturia (nighttime urination).  Patient reports  neuropathy (nerve pain). Patient denies visual changes. Patient reports self foot exams.   Patient reported dietary habits: Eats 3 meals/day Breakfast: eggs, yogurts, coffee (with milk and 1 sugar packet),toast Lunch: 3 corn tortillas, chicken, pork Dinner: cereal, yogurt, tea, coffee Drinks: diet coke, water, zero sugar gatorade   Patient-reported exercise habits: not discussed today  O:  7 day average blood glucose: 143 14 day average blood glucose: 139 30 day average blood glucose: 138  Since last visit, majority readings within goal range. 1 outlier on the low end of 64. 1 outlier >200.   Lab Results  Component Value Date   HGBA1C 15.0 (A) 06/17/2023   There were no vitals filed for this visit.  Lipid Panel     Component Value Date/Time   CHOL 178 06/17/2023 1559   TRIG 303 (H) 06/17/2023 1559   HDL 33 (L) 06/17/2023 1559   CHOLHDL 5.4 (H) 06/17/2023 1559   LDLCALC 94 06/17/2023 1559    Clinical Atherosclerotic Cardiovascular Disease (ASCVD): No  The 10-year ASCVD risk score (Arnett DK, et al., 2019) is: 5.8%   Values used to calculate the score:     Age: 74 years     Sex: Female     Is Non-Hispanic African American: No     Diabetic: Yes     Tobacco smoker: No     Systolic Blood Pressure: 149 mmHg     Is BP treated: Yes     HDL Cholesterol: 33 mg/dL     Total Cholesterol: 178 mg/dL   A/P: Diabetes longstanding currently uncontrolled but drastically improved according to home blood sugar data. Patient  is able to verbalize appropriate hypoglycemia management plan. Medication adherence appears optimal.  -Adjusted dose of Basaglar from 20 units once daily to 16u once daily.  -Adjusted dose of Trulicity (dulaglutide) from 0.75 mg to 1.5 mg weekly. Our pharmacy has been experiencing supply issues with the 1.5 mg dose. If it continues to not be available, pt is to remain on the 0.75mg  dose and maintain Basaglar 20u once daily.  -Patient educated on purpose, proper use,  and potential adverse effects of medications.  -Extensively discussed pathophysiology of diabetes, recommended lifestyle interventions, dietary effects on blood sugar control.  -Counseled on s/sx of and management of hypoglycemia.  -Next A1c anticipated 08/2023.   Hypertension longstanding currently uncontrolled. Blood pressure goal of <130/80  mmHg. Medication adherence appears optimal. Her renal function in October showed decline from previous d/t poor glycemic control seecondary to running out of medications. Labs last month showed improved renal function but still remains below 100%. eGFr in the 40s. She cannot tolerate amlodipine at either dose. At this point, I recommend low dose thiazide over increase in lisinopril. K last month was high end normal at 5.1. She's also had problems with LE edema even when not on amlodipine. A low dose hydrochlorothiazide may be useful to improve that. Due to her renal function, I have advised strict hydration and she's been instructed to return in 1-2 weeks for labs. -Continued Lisinopril 10 mg daily. -Start hydrochlorothiazide 12.5 mg daily.   Written patient instructions provided. Patient verbalized understanding of treatment plan.  Total time in face to face counseling 30 minutes.    Follow-up:  Pharmacist: prn PCP clinic visit: 09/16/2023   Butch Penny, PharmD, BCACP, CPP Clinical Pharmacist [facility] [phone]

## 2023-08-20 ENCOUNTER — Other Ambulatory Visit: Payer: Self-pay

## 2023-09-01 ENCOUNTER — Ambulatory Visit: Payer: Self-pay | Attending: Family Medicine

## 2023-09-01 DIAGNOSIS — Z794 Long term (current) use of insulin: Secondary | ICD-10-CM

## 2023-09-01 DIAGNOSIS — I10 Essential (primary) hypertension: Secondary | ICD-10-CM

## 2023-09-02 ENCOUNTER — Other Ambulatory Visit (INDEPENDENT_AMBULATORY_CARE_PROVIDER_SITE_OTHER): Payer: Self-pay | Admitting: Primary Care

## 2023-09-02 DIAGNOSIS — N179 Acute kidney failure, unspecified: Secondary | ICD-10-CM

## 2023-09-02 LAB — BMP8+EGFR
BUN/Creatinine Ratio: 19 (ref 9–23)
BUN: 38 mg/dL — ABNORMAL HIGH (ref 6–24)
CO2: 19 mmol/L — ABNORMAL LOW (ref 20–29)
Calcium: 8.6 mg/dL — ABNORMAL LOW (ref 8.7–10.2)
Chloride: 108 mmol/L — ABNORMAL HIGH (ref 96–106)
Creatinine, Ser: 2.04 mg/dL — ABNORMAL HIGH (ref 0.57–1.00)
Glucose: 215 mg/dL — ABNORMAL HIGH (ref 70–99)
Potassium: 4.9 mmol/L (ref 3.5–5.2)
Sodium: 140 mmol/L (ref 134–144)
eGFR: 30 mL/min/{1.73_m2} — ABNORMAL LOW (ref >59)

## 2023-09-16 ENCOUNTER — Ambulatory Visit (INDEPENDENT_AMBULATORY_CARE_PROVIDER_SITE_OTHER): Payer: Self-pay | Admitting: Primary Care

## 2023-09-16 ENCOUNTER — Other Ambulatory Visit: Payer: Self-pay

## 2023-09-16 ENCOUNTER — Encounter (INDEPENDENT_AMBULATORY_CARE_PROVIDER_SITE_OTHER): Payer: Self-pay | Admitting: Primary Care

## 2023-09-16 VITALS — BP 160/82 | HR 91 | Resp 16 | Ht 59.0 in | Wt 128.4 lb

## 2023-09-16 DIAGNOSIS — I10 Essential (primary) hypertension: Secondary | ICD-10-CM

## 2023-09-16 DIAGNOSIS — Z7985 Long-term (current) use of injectable non-insulin antidiabetic drugs: Secondary | ICD-10-CM

## 2023-09-16 DIAGNOSIS — Z794 Long term (current) use of insulin: Secondary | ICD-10-CM

## 2023-09-16 DIAGNOSIS — Z1211 Encounter for screening for malignant neoplasm of colon: Secondary | ICD-10-CM

## 2023-09-16 DIAGNOSIS — E119 Type 2 diabetes mellitus without complications: Secondary | ICD-10-CM

## 2023-09-16 DIAGNOSIS — E782 Mixed hyperlipidemia: Secondary | ICD-10-CM

## 2023-09-16 LAB — POCT GLYCOSYLATED HEMOGLOBIN (HGB A1C): HbA1c, POC (controlled diabetic range): 8 % — AB (ref 0.0–7.0)

## 2023-09-16 MED ORDER — LISINOPRIL 20 MG PO TABS
20.0000 mg | ORAL_TABLET | Freq: Every day | ORAL | 1 refills | Status: DC
  Filled 2023-09-16: qty 90, 90d supply, fill #0

## 2023-09-16 NOTE — Progress Notes (Unsigned)
Subjective:  Patient ID: Michele Morales, female    DOB: Jul 03, 1976  Age: 48 y.o. MRN: [id number]  CC: Diabetes and Hypertension (Haven't taken bp medication this morning )  Interpreter [place]rque [id number]  Michele Morales presents forFollow-up of diabetes. Patient does  check blood sugar at home Diabetes  Hypertension This is a chronic problem. The current episode started more than 1 year ago. The problem has been waxing and waning since onset. The problem is uncontrolled. Associated symptoms include malaise/fatigue. Risk factors for coronary artery disease include diabetes mellitus and stress. Past treatments include ACE inhibitors. The current treatment provides no improvement.  -   Compliant with meds - Yes Checking CBGs? Yes  Fasting avg - 76-198  Postprandial average -  Exercising regularly? - Yes Watching carbohydrate intake? - Yes Neuropathy ? - No Hypoglycemic events - No  - Recovers with :   Pertinent ROS:  Polyuria - No Polydipsia - No Vision problems - No  Medications as noted below. Taking them regularly without complication/adverse reaction being reported today.  Insomnia - both children are station away from home-  History Michele Morales has a past medical history of Diabetes mellitus without complication (HCC), Hypercholesteremia, Hypertension, and Neuromuscular disorder (HCC).   She has a past surgical history that includes hand procedure (Right) and ORIF wrist fracture (Right, 10/15/2022).   Her family history includes Diabetes in her mother.She reports that she has never smoked. She has never used smokeless tobacco. She reports that she does not drink alcohol and does not use drugs.  Current Outpatient Medications on File Prior to Visit  Medication Sig Dispense Refill   benzonatate (TESSALON) 100 MG capsule Take 1 to 2 capsules by mouth 3 times daily as needed. 20 capsule 0   Blood Glucose Monitoring Suppl (TRUE METRIX METER) w/Device KIT Use to check blood sugar three  times daily. 1 kit 0   cetirizine (ZYRTEC ALLERGY) 10 MG tablet Take 1 tablet (10 mg total) by mouth daily. 30 tablet 11   Dulaglutide (TRULICITY) 1.5 MG/0.5ML SOAJ Inject 1.5 mg into the skin once a week. 6 mL 1   glucose blood (TRUE METRIX BLOOD GLUCOSE TEST) test strip Use to check blood sugar three times daily. 100 each 2   hydrochlorothiazide (MICROZIDE) 12.5 MG capsule Take 1 capsule (12.5 mg total) by mouth daily. 60 capsule 1   Insulin Glargine (BASAGLAR KWIKPEN) 100 UNIT/ML Inject 26 Units into the skin daily. 9 mL 2   Insulin Pen Needle (PENTIPS) 32G X 4 MM MISC use as directed 200 each 3   lisinopril (ZESTRIL) 10 MG tablet Take 1 tablet (10 mg total) by mouth daily. 30 tablet 1   rosuvastatin (CRESTOR) 10 MG tablet Take 1 tablet (10 mg total) by mouth daily. 90 tablet 2   TRUEplus Lancets 28G MISC Use to check blood sugar three times daily. 100 each 2   No current facility-administered medications on file prior to visit.    Review of Systems  Constitutional:  Positive for malaise/fatigue.   Comprehensive ROS Pertinent positive and negative noted in HPI   Objective:  BP (!) 168/85 (BP Location: Left Arm, Patient Position: Sitting, Cuff Size: Normal)   Pulse 91   Resp 16   Ht 4\' 11"  (1.499 m)   Wt 128 lb 6.4 oz (58.2 kg)   LMP 09/08/2023   SpO2 100%   BMI 25.93 kg/m   BP Readings from Last 3 Encounters:  09/16/23 (!) 168/85  07/16/23 (!) 149/81  06/17/23 (!) 144/79  Wt Readings from Last 3 Encounters:  09/16/23 128 lb 6.4 oz (58.2 kg)  06/17/23 120 lb (54.4 kg)  10/15/22 134 lb 7.7 oz (61 kg)    Physical Exam  Lab Results  Component Value Date   HGBA1C 8.0 (A) 09/16/2023   HGBA1C 15.0 (A) 06/17/2023   HGBA1C 11.3 (A) 04/07/2022    Lab Results  Component Value Date   WBC 18.7 (H) 06/17/2023   HGB 9.5 (L) 06/17/2023   HCT 29.1 (L) 06/17/2023   PLT 580 (H) 06/17/2023   GLUCOSE 215 (H) 09/01/2023   CHOL 178 06/17/2023   TRIG 303 (H) 06/17/2023   HDL  33 (L) 06/17/2023   LDLCALC 94 06/17/2023   ALT 10 07/16/2023   AST 9 07/16/2023   NA 140 09/01/2023   K 4.9 09/01/2023   CL 108 (H) 09/01/2023   CREATININE 2.04 (H) 09/01/2023   BUN 38 (H) 09/01/2023   CO2 19 (L) 09/01/2023   TSH 0.934 06/17/2023   INR 1.1 01/21/2021   HGBA1C 8.0 (A) 09/16/2023     Assessment & Plan:   Michele Morales was seen today for diabetes and hypertension.  Diagnoses and all orders for this visit:  Screening for colon cancer -     Fecal occult blood, imunochemical; Future  Diabetes mellitus without complication (HCC) -     POCT glycosylated hemoglobin (Hb A1C) -     CBC with Differential/Platelet  Essential hypertension -     CBC with Differential/Platelet  Mixed hyperlipidemia -     Lipid panel   Screening for colon cancer -     Fecal occult blood, imunochemical; Future  Diabetes mellitus without complication (HCC) -     POCT glycosylated hemoglobin (Hb A1C) -     CBC with Differential/Platelet  Essential hypertension -     CBC with Differential/Platelet  Mixed hyperlipidemia -     Lipid panel     Follow-up:  Return in about 3 months (around 12/15/2023) for dm and htn .  The above assessment and management plan was discussed with the patient. The patient verbalized understanding of and has agreed to the management plan. Patient is aware to call the clinic if symptoms fail to improve or worsen. Patient is aware when to return to the clinic for a follow-up visit. Patient educated on when it is appropriate to go to the emergency department.   Gwinda Passe, NP-C

## 2023-09-17 ENCOUNTER — Telehealth (INDEPENDENT_AMBULATORY_CARE_PROVIDER_SITE_OTHER): Payer: Self-pay | Admitting: Licensed Clinical Social Worker

## 2023-09-17 LAB — CBC WITH DIFFERENTIAL/PLATELET
Basophils Absolute: 0.1 10*3/uL (ref 0.0–0.2)
Basos: 1 %
EOS (ABSOLUTE): 0.2 10*3/uL (ref 0.0–0.4)
Eos: 2 %
Hematocrit: 26.3 % — ABNORMAL LOW (ref 34.0–46.6)
Hemoglobin: 8.8 g/dL — ABNORMAL LOW (ref 11.1–15.9)
Immature Grans (Abs): 0 10*3/uL (ref 0.0–0.1)
Immature Granulocytes: 0 %
Lymphocytes Absolute: 2 10*3/uL (ref 0.7–3.1)
Lymphs: 19 %
MCH: 28.8 pg (ref 26.6–33.0)
MCHC: 33.5 g/dL (ref 31.5–35.7)
MCV: 86 fL (ref 79–97)
Monocytes Absolute: 0.6 10*3/uL (ref 0.1–0.9)
Monocytes: 6 %
Neutrophils Absolute: 7.8 10*3/uL — ABNORMAL HIGH (ref 1.4–7.0)
Neutrophils: 72 %
Platelets: 394 10*3/uL (ref 150–450)
RBC: 3.06 x10E6/uL — ABNORMAL LOW (ref 3.77–5.28)
RDW: 14.3 % (ref 11.7–15.4)
WBC: 10.8 10*3/uL (ref 3.4–10.8)

## 2023-09-17 LAB — LIPID PANEL
Chol/HDL Ratio: 2.8 {ratio} (ref 0.0–4.4)
Cholesterol, Total: 143 mg/dL (ref 100–199)
HDL: 51 mg/dL (ref >39)
LDL Chol Calc (NIH): 71 mg/dL (ref 0–99)
Triglycerides: 116 mg/dL (ref 0–149)
VLDL Cholesterol Cal: 21 mg/dL (ref 5–40)

## 2023-09-17 NOTE — Telephone Encounter (Signed)
LCSWA called patient today to introduce herself and to assess patients' mental health needs. Patient did answer the phone. LCSWA was able to speak to pt about her depression we will connect her with outside counseling resources. Patient was referred by PCP for depression .

## 2023-09-18 ENCOUNTER — Other Ambulatory Visit: Payer: Self-pay

## 2023-09-22 ENCOUNTER — Encounter (INDEPENDENT_AMBULATORY_CARE_PROVIDER_SITE_OTHER): Payer: Self-pay | Admitting: Primary Care

## 2023-10-14 ENCOUNTER — Ambulatory Visit (INDEPENDENT_AMBULATORY_CARE_PROVIDER_SITE_OTHER): Payer: Self-pay

## 2023-10-22 ENCOUNTER — Telehealth (INDEPENDENT_AMBULATORY_CARE_PROVIDER_SITE_OTHER): Payer: Self-pay | Admitting: Primary Care

## 2023-10-22 NOTE — Telephone Encounter (Signed)
 Called pt to remind them about atp. Pt will be present

## 2023-10-28 ENCOUNTER — Telehealth (INDEPENDENT_AMBULATORY_CARE_PROVIDER_SITE_OTHER): Payer: Self-pay | Admitting: Primary Care

## 2023-10-28 NOTE — Telephone Encounter (Signed)
 Called pt about their upcoming apt. VM left with pt, please advise

## 2023-10-29 ENCOUNTER — Ambulatory Visit (INDEPENDENT_AMBULATORY_CARE_PROVIDER_SITE_OTHER): Payer: Self-pay | Admitting: Primary Care

## 2023-12-16 ENCOUNTER — Other Ambulatory Visit: Payer: Self-pay

## 2023-12-16 ENCOUNTER — Encounter (INDEPENDENT_AMBULATORY_CARE_PROVIDER_SITE_OTHER): Payer: Self-pay | Admitting: Primary Care

## 2023-12-16 ENCOUNTER — Ambulatory Visit (INDEPENDENT_AMBULATORY_CARE_PROVIDER_SITE_OTHER): Payer: Self-pay | Admitting: Primary Care

## 2023-12-16 VITALS — BP 136/78 | HR 95 | Resp 16 | Wt 132.0 lb

## 2023-12-16 DIAGNOSIS — R21 Rash and other nonspecific skin eruption: Secondary | ICD-10-CM

## 2023-12-16 DIAGNOSIS — E119 Type 2 diabetes mellitus without complications: Secondary | ICD-10-CM

## 2023-12-16 DIAGNOSIS — I10 Essential (primary) hypertension: Secondary | ICD-10-CM

## 2023-12-16 LAB — POCT GLYCOSYLATED HEMOGLOBIN (HGB A1C): HbA1c, POC (controlled diabetic range): 7.6 % — AB (ref 0.0–7.0)

## 2023-12-16 MED ORDER — LISINOPRIL 20 MG PO TABS
20.0000 mg | ORAL_TABLET | Freq: Every day | ORAL | 1 refills | Status: DC
  Filled 2023-12-16: qty 90, 90d supply, fill #0

## 2023-12-16 MED ORDER — CLONIDINE HCL 0.1 MG PO TABS
0.1000 mg | ORAL_TABLET | Freq: Once | ORAL | Status: AC
  Administered 2023-12-16: 0.1 mg via ORAL

## 2023-12-16 MED ORDER — HYDROCHLOROTHIAZIDE 25 MG PO TABS
25.0000 mg | ORAL_TABLET | Freq: Every day | ORAL | 1 refills | Status: DC
  Filled 2023-12-16: qty 90, 90d supply, fill #0

## 2023-12-16 NOTE — Progress Notes (Signed)
 Subjective:  Patient ID: Michele Morales, female    DOB: September 07, 1975  Age: 48 y.o. 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  However she had a headache blood pressure was 190/100 repeat it 180/100.  Hypertension urgency patient given clonidine  0.2 mg was told to relax and lay back.  Medication for blood pressure improved to 136/78 and headache has resolved.  Discussed the risk of heart attack and stroke with uncontrolled blood pressure.  She denied chest pain, No abdominal pain - Nausea,  new weakness tingling or numbness, No Cough - shortness of breath patient She does not check blood sugar at home  Compliant with meds - Yes stated ran out did not realize refills available explained 3 months plus 1 refull Checking CBGs? No  Fasting avg -   Postprandial average -  Exercising regularly? - Yes Watching carbohydrate intake? - Yes Neuropathy ? - No Hypoglycemic events - No  - Recovers with :   Pertinent ROS:  Polyuria - No Polydipsia - No Vision problems - No  Medications as noted below. Taking them regularly without complication/adverse reaction being reported today.   History Michele Morales has a past medical history of Diabetes mellitus without complication (HCC), Hypercholesteremia, Hypertension, and Neuromuscular disorder (HCC).   She has a past surgical history that includes hand procedure (Right) and ORIF wrist fracture (Right, 10/15/2022).   Her family history includes Diabetes in her mother.She reports that she has never smoked. She has never used smokeless tobacco. She reports that she does not drink alcohol and does not use drugs.  Current Outpatient Medications on File Prior to Visit  Medication Sig Dispense Refill   benzonatate  (TESSALON ) 100 MG capsule Take 1 to 2 capsules by mouth 3 times daily as needed. 20 capsule 0   Blood Glucose Monitoring Suppl (TRUE METRIX METER) w/Device  KIT Use to check blood sugar three times daily. 1 kit 0   cetirizine  (ZYRTEC  ALLERGY) 10 MG tablet Take 1 tablet (10 mg total) by mouth daily. 30 tablet 11   Dulaglutide  (TRULICITY ) 1.5 MG/0.5ML SOAJ Inject 1.5 mg into the skin once a week. 6 mL 1   glucose blood (TRUE METRIX BLOOD GLUCOSE TEST) test strip Use to check blood sugar three times daily. 100 each 2   hydrochlorothiazide  (MICROZIDE ) 12.5 MG capsule Take 1 capsule (12.5 mg total) by mouth daily. 60 capsule 1   Insulin  Glargine (BASAGLAR  KWIKPEN) 100 UNIT/ML Inject 26 Units into the skin daily. 9 mL 2   Insulin  Pen Needle (PENTIPS) 32G X 4 MM MISC use as directed 200 each 3   lisinopril  (ZESTRIL ) 20 MG tablet Take 1 tablet (20 mg total) by mouth daily. 90 tablet 1   rosuvastatin  (CRESTOR ) 10 MG tablet Take 1 tablet (10 mg total) by mouth daily. 90 tablet 2   TRUEplus Lancets 28G MISC Use to check blood sugar three times daily. 100 each 2   No current facility-administered medications on file prior to visit.    Review of Systems Comprehensive ROS Pertinent positive and negative noted in HPI   Objective:  Pulse 95   Resp 16   Wt 132 lb (59.9 kg)   SpO2 100%   BMI 26.66 kg/m   BP Readings from Last 3 Encounters:  09/16/23 (!) 160/82  07/16/23 (!) 149/81  06/17/23 (!) 144/79    Wt Readings from Last 3 Encounters:  12/16/23 132 lb (59.9 kg)  09/16/23 128 lb  6.4 oz (58.2 kg)  06/17/23 120 lb (54.4 kg)   Physical Exam Vitals reviewed.  Constitutional:      Appearance: Normal appearance.  HENT:     Head: Normocephalic.     Right Ear: Tympanic membrane, ear canal and external ear normal.     Left Ear: Tympanic membrane, ear canal and external ear normal.     Nose: Nose normal.     Mouth/Throat:     Mouth: Mucous membranes are moist.  Eyes:     Extraocular Movements: Extraocular movements intact.     Pupils: Pupils are equal, round, and reactive to light.  Cardiovascular:     Rate and Rhythm: Normal rate.   Pulmonary:     Effort: Pulmonary effort is normal.     Breath sounds: Normal breath sounds.  Abdominal:     General: Bowel sounds are normal.     Palpations: Abdomen is soft.  Musculoskeletal:        General: Normal range of motion.     Cervical back: Normal range of motion.  Skin:    General: Skin is warm and dry.  Neurological:     Mental Status: She is alert and oriented to person, place, and time.  Psychiatric:        Mood and Affect: Mood normal.        Behavior: Behavior normal.        Thought Content: Thought content normal.    Lab Results  Component Value Date   HGBA1C 8.0 (A) 09/16/2023   HGBA1C 15.0 (A) 06/17/2023   HGBA1C 11.3 (A) 04/07/2022    Lab Results  Component Value Date   WBC 10.8 09/16/2023   HGB 8.8 (L) 09/16/2023   HCT 26.3 (L) 09/16/2023   PLT 394 09/16/2023   GLUCOSE 215 (H) 09/01/2023   CHOL 143 09/16/2023   TRIG 116 09/16/2023   HDL 51 09/16/2023   LDLCALC 71 09/16/2023   ALT 10 07/16/2023   AST 9 07/16/2023   NA 140 09/01/2023   K 4.9 09/01/2023   CL 108 (H) 09/01/2023   CREATININE 2.04 (H) 09/01/2023   BUN 38 (H) 09/01/2023   CO2 19 (L) 09/01/2023   TSH 0.934 06/17/2023   INR 1.1 01/21/2021   HGBA1C 8.0 (A) 09/16/2023     Assessment & Plan:  Michele Morales was seen today for diabetes.  Diagnoses and all orders for this visit:  Diabetes mellitus without complication (HCC) -     POCT glycosylated hemoglobin (Hb A1C) Lab Results  Component Value Date   HGBA1C 7.6 (A) 12/16/2023   HGBA1C 8.0 (A) 09/16/2023   HGBA1C 15.0 (A) 06/17/2023   Slight improvement no changes in meds followed by clinical pharmacist   Essential hypertension BP goal - < 130/80 Explained that having normal blood pressure is the goal and medications are helping to get to goal and maintain normal blood pressure. DIET: Limit salt intake, read nutrition labels to check salt content, limit fried and high fatty foods  Avoid using multisymptom OTC cold preparations  that generally contain sudafed which can rise BP. Consult with pharmacist on best cold relief products to use for persons with HTN EXERCISE Discussed incorporating exercise such as walking - 30 minutes most days of the week and can do in 10 minute intervals    -     CMP14+EGFR -     cloNIDine  (CATAPRES ) tablet 0.1 mg -     cloNIDine  (CATAPRES ) tablet 0.1 mg -     lisinopril  (  ZESTRIL ) 20 MG tablet; Take 1 tablet (20 mg total) by mouth daily.  Rash  cmp Other orders -     hydrochlorothiazide  (HYDRODIURIL ) 25 MG tablet; Take 1 tablet (25 mg total) by mouth daily.  Follow-up:  Return in about 3 months (around 03/16/2024).  The above assessment and management plan was discussed with the patient. The patient verbalized understanding of and has agreed to the management plan. Patient is aware to call the clinic if symptoms fail to improve or worsen. Patient is aware when to return to the clinic for a follow-up visit. Patient educated on when it is appropriate to go to the emergency department.   Madelyn Schick, NP-C

## 2023-12-17 LAB — CMP14+EGFR
ALT: 7 IU/L (ref 0–32)
AST: 11 IU/L (ref 0–40)
Albumin: 3.4 g/dL — ABNORMAL LOW (ref 3.9–4.9)
Alkaline Phosphatase: 92 IU/L (ref 44–121)
BUN/Creatinine Ratio: 17 (ref 9–23)
BUN: 35 mg/dL — ABNORMAL HIGH (ref 6–24)
Bilirubin Total: 0.2 mg/dL (ref 0.0–1.2)
CO2: 16 mmol/L — ABNORMAL LOW (ref 20–29)
Calcium: 8.4 mg/dL — ABNORMAL LOW (ref 8.7–10.2)
Chloride: 108 mmol/L — ABNORMAL HIGH (ref 96–106)
Creatinine, Ser: 2.11 mg/dL — ABNORMAL HIGH (ref 0.57–1.00)
Globulin, Total: 2.3 g/dL (ref 1.5–4.5)
Glucose: 179 mg/dL — ABNORMAL HIGH (ref 70–99)
Potassium: 5.9 mmol/L — ABNORMAL HIGH (ref 3.5–5.2)
Sodium: 135 mmol/L (ref 134–144)
Total Protein: 5.7 g/dL — ABNORMAL LOW (ref 6.0–8.5)
eGFR: 29 mL/min/{1.73_m2} — ABNORMAL LOW (ref >59)

## 2023-12-21 ENCOUNTER — Other Ambulatory Visit (INDEPENDENT_AMBULATORY_CARE_PROVIDER_SITE_OTHER): Payer: Self-pay | Admitting: Primary Care

## 2023-12-21 DIAGNOSIS — E1121 Type 2 diabetes mellitus with diabetic nephropathy: Secondary | ICD-10-CM

## 2023-12-31 ENCOUNTER — Ambulatory Visit (INDEPENDENT_AMBULATORY_CARE_PROVIDER_SITE_OTHER): Payer: Self-pay | Admitting: Primary Care

## 2023-12-31 ENCOUNTER — Encounter (INDEPENDENT_AMBULATORY_CARE_PROVIDER_SITE_OTHER): Payer: Self-pay | Admitting: Primary Care

## 2023-12-31 VITALS — BP 172/92 | HR 106 | Resp 16 | Wt 131.8 lb

## 2023-12-31 DIAGNOSIS — R21 Rash and other nonspecific skin eruption: Secondary | ICD-10-CM

## 2023-12-31 DIAGNOSIS — I10 Essential (primary) hypertension: Secondary | ICD-10-CM

## 2023-12-31 NOTE — Progress Notes (Signed)
 [facility]  Michele Morales, is a 48 y.o. female  ZOX:[id number]  WJX:[id number]  DOB - [birth year]  Chief Complaint  Patient presents with   Rash    2 week f/u        Subjective:   [name] is a 48 y.o. 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  Patient is concerned about a rash that she has had for 2 weeks.  PCP is concerned about blood pressure 185/96 will address first thing in evaluate rash.  Bp-question patient was she taking medication she explains she takes it every morning then as how many pills one HCTZ 25 mg not knowing that she was also to continue her lisinopril .  HPI  No problems updated.  Comprehensive ROS Pertinent positive and negative noted in HPI   Allergies  Allergen Reactions   Amlodipine  Swelling    BLE edema    Past Medical History:  Diagnosis Date   Diabetes mellitus without complication (HCC)    IDDM   Hypercholesteremia    Hypertension    Neuromuscular disorder (HCC)    diabetic neuropathy feet    Current Outpatient Medications on File Prior to Visit  Medication Sig Dispense Refill   benzonatate  (TESSALON ) 100 MG capsule Take 1 to 2 capsules by mouth 3 times daily as needed. 20 capsule 0   Blood Glucose Monitoring Suppl (TRUE METRIX METER) w/Device KIT Use to check blood sugar three times daily. 1 kit 0   cetirizine  (ZYRTEC  ALLERGY) 10 MG tablet Take 1 tablet (10 mg total) by mouth daily. 30 tablet 11   Dulaglutide  (TRULICITY ) 1.5 MG/0.5ML SOAJ Inject 1.5 mg into the skin once a week. 6 mL 1   glucose blood (TRUE METRIX BLOOD GLUCOSE TEST) test strip Use to check blood sugar three times daily. 100 each 2   hydrochlorothiazide  (HYDRODIURIL ) 25 MG tablet Take 1 tablet (25 mg total) by mouth daily. 90 tablet 1   Insulin  Glargine (BASAGLAR  KWIKPEN) 100 UNIT/ML Inject 26 Units into the skin daily. 9 mL 2   Insulin  Pen Needle (PENTIPS) 32G X 4 MM MISC use as directed 200 each 3    lisinopril  (ZESTRIL ) 20 MG tablet Take 1 tablet (20 mg total) by mouth daily. 90 tablet 1   rosuvastatin  (CRESTOR ) 10 MG tablet Take 1 tablet (10 mg total) by mouth daily. 90 tablet 2   TRUEplus Lancets 28G MISC Use to check blood sugar three times daily. 100 each 2   No current facility-administered medications on file prior to visit.   Health Maintenance  Topic Date Due   Eye exam for diabetics  Never done   Pap with HPV screening  05/25/2009   Colon Cancer Screening  Never done   COVID-19 Vaccine (1 - 2024-25 season) Never done   Flu Shot  03/25/2024   Yearly kidney health urinalysis for diabetes  06/16/2024   Hemoglobin A1C  06/16/2024   Complete foot exam   09/15/2024   Yearly kidney function blood test for diabetes  12/15/2024   DTaP/Tdap/Td vaccine (3 - Td or Tdap) 01/21/2031   Pneumococcal Vaccination  Completed   Hepatitis C Screening  Completed   HIV Screening  Completed   HPV Vaccine  Aged Out   Meningitis B Vaccine  Aged Out    Objective:   Vitals:   12/31/23 0910 12/31/23 0911 12/31/23 0949  BP: (!) 184/97 (!) 185/96 (!) 172/92  Pulse: (!) 106    Resp: 16  SpO2: 100%    Weight: 131 lb 12.8 oz (59.8 kg)     BP Readings from Last 3 Encounters:  12/31/23 (!) 172/92  12/16/23 136/78  09/16/23 (!) 160/82      Physical Exam Vitals reviewed.  Constitutional:      Appearance: Normal appearance.  HENT:     Head: Normocephalic.     Right Ear: Tympanic membrane, ear canal and external ear normal.     Left Ear: Tympanic membrane, ear canal and external ear normal.     Nose: Nose normal.     Mouth/Throat:     Mouth: Mucous membranes are moist.  Eyes:     Extraocular Movements: Extraocular movements intact.     Pupils: Pupils are equal, round, and reactive to light.  Cardiovascular:     Rate and Rhythm: Normal rate.  Pulmonary:     Effort: Pulmonary effort is normal.     Breath sounds: Normal breath sounds.  Abdominal:     General: Bowel sounds are  normal.     Palpations: Abdomen is soft.  Musculoskeletal:        General: Normal range of motion.     Cervical back: Normal range of motion.  Skin:    General: Skin is warm and dry.  Neurological:     Mental Status: She is alert and oriented to person, place, and time.  Psychiatric:        Mood and Affect: Mood normal.        Behavior: Behavior normal.        Thought Content: Thought content normal.      Assessment & Plan  Brook was seen today for rash.  Diagnoses and all orders for this visit:  Essential hypertension See HPI BP goal - < 130/80 Explained that having normal blood pressure is the goal and medications are helping to get to goal and maintain normal blood pressure. DIET: Limit salt intake, read nutrition labels to check salt content, limit fried and high fatty foods  Avoid using multisymptom OTC cold preparations that generally contain sudafed which can rise BP. Consult with pharmacist on best cold relief products to use for persons with HTN EXERCISE Discussed incorporating exercise such as walking - 30 minutes most days of the week and can do in 10 minute intervals     Rash Conservative treatment luke warm baths, Aveeno oatmeal bath or emollients. Medications can be discussed at this visit- antihistamine or pruritic medications. Causes may be contributed to changes in soaps, deodorants, laundry detergent or environmental- grass, weeds and trees.          Patient have been counseled extensively about nutrition and exercise. Other issues discussed during this visit include: low cholesterol diet, weight control and daily exercise, foot care, annual eye examinations at Ophthalmology, importance of adherence with medications and regular follow-up. We also discussed long term complications of uncontrolled diabetes and hypertension.   Return in about 3 weeks (around 01/21/2024) for re-check blood pressure.  The patient was given clear instructions to go to ER or  return to medical center if symptoms don't improve, worsen or new problems develop. The patient verbalized understanding. The patient was told to call to get lab results if they haven't heard anything in the next week.   This note has been created with Education officer, environmental. Any transcriptional errors are unintentional.   Marius Siemens, NP 01/11/2024, 1:29 PM

## 2024-01-27 ENCOUNTER — Telehealth (INDEPENDENT_AMBULATORY_CARE_PROVIDER_SITE_OTHER): Payer: Self-pay | Admitting: Primary Care

## 2024-01-27 ENCOUNTER — Ambulatory Visit (INDEPENDENT_AMBULATORY_CARE_PROVIDER_SITE_OTHER): Payer: Self-pay | Admitting: Primary Care

## 2024-01-27 NOTE — Telephone Encounter (Signed)
 Called pt to offer another scheduled appt. Pt wanted to keep 7/23 appt.

## 2024-03-15 ENCOUNTER — Telehealth (INDEPENDENT_AMBULATORY_CARE_PROVIDER_SITE_OTHER): Payer: Self-pay | Admitting: Primary Care

## 2024-03-15 NOTE — Telephone Encounter (Signed)
 Called pt to confirm appt.Pt did not answer and could not LVM.

## 2024-03-16 ENCOUNTER — Encounter (INDEPENDENT_AMBULATORY_CARE_PROVIDER_SITE_OTHER): Payer: Self-pay | Admitting: Primary Care

## 2024-03-16 ENCOUNTER — Ambulatory Visit (INDEPENDENT_AMBULATORY_CARE_PROVIDER_SITE_OTHER): Payer: Self-pay | Admitting: Primary Care

## 2024-03-16 ENCOUNTER — Other Ambulatory Visit: Payer: Self-pay

## 2024-03-16 DIAGNOSIS — Z1211 Encounter for screening for malignant neoplasm of colon: Secondary | ICD-10-CM

## 2024-03-16 DIAGNOSIS — Z7985 Long-term (current) use of injectable non-insulin antidiabetic drugs: Secondary | ICD-10-CM

## 2024-03-16 DIAGNOSIS — Z794 Long term (current) use of insulin: Secondary | ICD-10-CM

## 2024-03-16 DIAGNOSIS — I10 Essential (primary) hypertension: Secondary | ICD-10-CM

## 2024-03-16 DIAGNOSIS — E1165 Type 2 diabetes mellitus with hyperglycemia: Secondary | ICD-10-CM

## 2024-03-16 DIAGNOSIS — Z603 Acculturation difficulty: Secondary | ICD-10-CM

## 2024-03-16 DIAGNOSIS — Z0184 Encounter for antibody response examination: Secondary | ICD-10-CM

## 2024-03-16 DIAGNOSIS — E782 Mixed hyperlipidemia: Secondary | ICD-10-CM

## 2024-03-16 LAB — POCT GLYCOSYLATED HEMOGLOBIN (HGB A1C): HbA1c, POC (controlled diabetic range): 7.5 % — AB (ref 0.0–7.0)

## 2024-03-16 LAB — GLUCOSE, POCT (MANUAL RESULT ENTRY): POC Glucose: 151 mg/dL — AB (ref 70–99)

## 2024-03-16 MED ORDER — BASAGLAR KWIKPEN 100 UNIT/ML ~~LOC~~ SOPN
26.0000 [IU] | PEN_INJECTOR | Freq: Every day | SUBCUTANEOUS | 2 refills | Status: DC
  Filled 2024-03-16: qty 9, 34d supply, fill #0

## 2024-03-16 MED ORDER — TRULICITY 1.5 MG/0.5ML ~~LOC~~ SOAJ
1.5000 mg | SUBCUTANEOUS | 1 refills | Status: AC
  Filled 2024-03-16: qty 2, 28d supply, fill #0

## 2024-03-16 MED ORDER — LISINOPRIL 20 MG PO TABS
20.0000 mg | ORAL_TABLET | Freq: Every day | ORAL | 1 refills | Status: DC
  Filled 2024-03-16: qty 90, 90d supply, fill #0

## 2024-03-16 MED ORDER — HYDROCHLOROTHIAZIDE 25 MG PO TABS
25.0000 mg | ORAL_TABLET | Freq: Every day | ORAL | 1 refills | Status: DC
  Filled 2024-03-16: qty 90, 90d supply, fill #0

## 2024-03-16 NOTE — Progress Notes (Signed)
 [facility]   Michele Morales is a 48 y.o. Hispanic female (she has given permission for her husband to be at her appointment.Michele Morales ) Chief Executive Officer [phone].  Patient presents for hypertension evaluation, Denies shortness of breath, headaches, chest pain or lower extremity edema, sudden onset, vision changes, unilateral weakness, dizziness, paresthesias .  Blood pressure is ridiculous 187/100-interpreter explaining risk of a heart attack and stroke.  Also, made her aware that she gets a 90-day supply of medication with 1 refill.  After that you have to be seen to get more medication. Patient denies adherence with medications. She has not had Bp meds stated the pharmacy will not dispense it to her spoke with pharmacy she has refills left.   Past Medical History:  Diagnosis Date   Diabetes mellitus without complication (HCC)    IDDM   Hypercholesteremia    Hypertension    Neuromuscular disorder (HCC)    diabetic neuropathy feet   Past Surgical History:  Procedure Laterality Date   hand procedure Right    right wrist - used sedation to reset in the ER Dept   ORIF WRIST FRACTURE Right 10/15/2022   Procedure: OPEN REDUCTION INTERNAL FIXATION (ORIF) WRIST FRACTURE;  Surgeon: Shari Easter, MD;  Location: [facility] OR;  Service: Orthopedics;  Laterality: Right;  regional with iv sedation   Allergies  Allergen Reactions   Amlodipine  Swelling    BLE edema   Current Outpatient Medications on File Prior to Visit  Medication Sig Dispense Refill   benzonatate  (TESSALON ) 100 MG capsule Take 1 to 2 capsules by mouth 3 times daily as needed. 20 capsule 0   Blood Glucose Monitoring Suppl (TRUE METRIX METER) w/Device KIT Use to check blood sugar three times daily. 1 kit 0   cetirizine  (ZYRTEC  ALLERGY) 10 MG tablet Take 1 tablet (10 mg total) by mouth daily. 30 tablet 11   Dulaglutide  (TRULICITY ) 1.5 MG/0.5ML SOAJ Inject 1.5 mg into the skin once a week. 6 mL 1   glucose blood (TRUE METRIX  BLOOD GLUCOSE TEST) test strip Use to check blood sugar three times daily. 100 each 2   hydrochlorothiazide  (HYDRODIURIL ) 25 MG tablet Take 1 tablet (25 mg total) by mouth daily. 90 tablet 1   Insulin  Glargine (BASAGLAR  KWIKPEN) 100 UNIT/ML Inject 26 Units into the skin daily. 9 mL 2   Insulin  Pen Needle (PENTIPS) 32G X 4 MM MISC use as directed 200 each 3   lisinopril  (ZESTRIL ) 20 MG tablet Take 1 tablet (20 mg total) by mouth daily. 90 tablet 1   rosuvastatin  (CRESTOR ) 10 MG tablet Take 1 tablet (10 mg total) by mouth daily. 90 tablet 2   TRUEplus Lancets 28G MISC Use to check blood sugar three times daily. 100 each 2   No current facility-administered medications on file prior to visit.   Social History   Socioeconomic History   Marital status: Single    Spouse name: Not on file   Number of children: Not on file   Years of education: Not on file   Highest education level: Not on file  Occupational History   Not on file  Tobacco Use   Smoking status: Never   Smokeless tobacco: Never  Vaping Use   Vaping status: Never Used  Substance and Sexual Activity   Alcohol use: No   Drug use: No   Sexual activity: Not Currently    Birth control/protection: Injection    Comment: Depro  Other Topics Concern   Not on file  Social History Narrative   Not on file   Social Drivers of Health   Financial Resource Strain: Not on file  Food Insecurity: No Food Insecurity (09/16/2023)   Hunger Vital Sign    Worried About Running Out of Food in the Last Year: Never true    Ran Out of Food in the Last Year: Never true  Transportation Needs: No Transportation Needs (09/16/2023)   PRAPARE - Administrator, Civil Service (Medical): No    Lack of Transportation (Non-Medical): No  Physical Activity: Not on file  Stress: Not on file  Social Connections: Not on file  Intimate Partner Violence: Not At Risk (09/16/2023)   Humiliation, Afraid, Rape, and Kick questionnaire    Fear of  Current or Ex-Partner: No    Emotionally Abused: No    Physically Abused: No    Sexually Abused: No   Family History  Problem Relation Age of Onset   Diabetes Mother    Health Maintenance  Topic Date Due   OPHTHALMOLOGY EXAM  Never done   Hepatitis B Vaccines (1 of 3 - 19+ 3-dose series) Never done   Cervical Cancer Screening (HPV/Pap Cotest)  05/25/2009   Colonoscopy  Never done   COVID-19 Vaccine (1 - 2024-25 season) Never done   INFLUENZA VACCINE  03/25/2024   Diabetic kidney evaluation - Urine ACR  06/16/2024   FOOT EXAM  09/15/2024   HEMOGLOBIN A1C  09/16/2024   Diabetic kidney evaluation - eGFR measurement  12/15/2024   DTaP/Tdap/Td (3 - Td or Tdap) 01/21/2031   Pneumococcal Vaccine 3-50 Years old  Completed   Hepatitis C Screening  Completed   HIV Screening  Completed   HPV VACCINES  Aged Out   Meningococcal B Vaccine  Aged Out     OBJECTIVE:  Vitals:   03/16/24 0906  BP: (!) 187/100  Pulse: 99  Resp: 16  SpO2: 100%  Weight: 126 lb (57.2 kg)    Physical Exam Vitals reviewed.  Constitutional:      Appearance: Normal appearance. She is normal weight.  HENT:     Head: Normocephalic.     Right Ear: Tympanic membrane, ear canal and external ear normal.     Left Ear: Tympanic membrane, ear canal and external ear normal.     Nose: Nose normal.     Mouth/Throat:     Mouth: Mucous membranes are moist.  Eyes:     Extraocular Movements: Extraocular movements intact.     Pupils: Pupils are equal, round, and reactive to light.  Cardiovascular:     Rate and Rhythm: Normal rate.  Pulmonary:     Effort: Pulmonary effort is normal.     Breath sounds: Normal breath sounds.  Abdominal:     General: Bowel sounds are normal.     Palpations: Abdomen is soft.  Musculoskeletal:        General: Normal range of motion.     Cervical back: Normal range of motion.  Skin:    General: Skin is warm and dry.  Neurological:     Mental Status: She is alert and oriented to  person, place, and time.  Psychiatric:        Mood and Affect: Mood normal.        Behavior: Behavior normal.        Thought Content: Thought content normal.      ROS  Last 3 Office BP readings: BP Readings from Last 3 Encounters:  03/16/24 (!) 187/100  12/31/23 ROLLEN)  172/92  12/16/23 136/78    BMET    Component Value Date/Time   NA 135 12/16/2023 1007   K 5.9 (H) 12/16/2023 1007   CL 108 (H) 12/16/2023 1007   CO2 16 (L) 12/16/2023 1007   GLUCOSE 179 (H) 12/16/2023 1007   GLUCOSE 214 (H) 10/15/2022 1109   BUN 35 (H) 12/16/2023 1007   CREATININE 2.11 (H) 12/16/2023 1007   CREATININE 0.51 01/31/2021 1454   CALCIUM  8.4 (L) 12/16/2023 1007   GFRNONAA >60 10/15/2022 1109   GFRNONAA 117 01/31/2021 1454   GFRAA 136 01/31/2021 1454    Renal function: CrCl cannot be calculated (Patient's most recent lab result is older than the maximum 21 days allowed.).  Clinical ASCVD: No  The 10-year ASCVD risk score (Arnett DK, et al., 2019) is: 3.9%   Values used to calculate the score:     Age: 44 years     Clincally relevant sex: Female     Is Non-Hispanic African American: No     Diabetic: Yes     Tobacco smoker: No     Systolic Blood Pressure: 187 mmHg     Is BP treated: Yes     HDL Cholesterol: 51 mg/dL     Total Cholesterol: 143 mg/dL  ASCVD risk factors include- [place]   ASSESSMENT & PLAN: Michele Morales was seen today for hypertension, hyperlipidemia and diabetes.  Diagnoses and all orders for this visit:  Type 2 diabetes mellitus with hyperglycemia, with long-term current use of insulin  (HCC) -     POCT glycosylated hemoglobin (Hb A1C) -     CBC with Differential/Platelet -     POCT glucose (manual entry) -     Dulaglutide  (TRULICITY ) 1.5 MG/0.5ML SOAJ; Inject 1.5 mg into the skin once a week. -     Insulin  Glargine (BASAGLAR  KWIKPEN) 100 UNIT/ML; Inject 26 Units into the skin daily. (Patient taking differently: Inject 20 Units into the skin daily.)  Essential  hypertension BP goal - < 130/80 Explained that having normal blood pressure is the goal and medications are helping to get to goal and maintain normal blood pressure. DIET: Limit salt intake, read nutrition labels to check salt content, limit fried and high fatty foods  Avoid using multisymptom OTC cold preparations that generally contain sudafed which can rise BP. Consult with pharmacist on best cold relief products to use for persons with HTN EXERCISE Discussed incorporating exercise such as walking - 30 minutes most days of the week and can do in 10 minute intervals    -     CMP14+EGFR -     hydrochlorothiazide  (HYDRODIURIL ) 25 MG tablet; Take 1 tablet (25 mg total) by mouth daily. -     lisinopril  (ZESTRIL ) 20 MG tablet; Take 1 tablet (20 mg total) by mouth daily.  Language barrier  Mixed hyperlipidemia -     Lipid panel  Screening for colon cancer Referral to GI   Immunity status testing -     Hepatitis B surface antibody,qualitative     This note has been created with Education officer, environmental. Any transcriptional errors are unintentional.   Michele SHAUNNA Bohr, NP 03/16/2024, 9:25 AM

## 2024-03-17 LAB — CMP14+EGFR
ALT: 7 IU/L (ref 0–32)
AST: 10 IU/L (ref 0–40)
Albumin: 3.8 g/dL — ABNORMAL LOW (ref 3.9–4.9)
Alkaline Phosphatase: 84 IU/L (ref 44–121)
BUN/Creatinine Ratio: 15 (ref 9–23)
BUN: 50 mg/dL — ABNORMAL HIGH (ref 6–24)
Bilirubin Total: <0.2 mg/dL (ref 0.0–1.2)
CO2: 14 mmol/L — ABNORMAL LOW (ref 20–29)
Calcium: 8.5 mg/dL — ABNORMAL LOW (ref 8.7–10.2)
Chloride: 110 mmol/L — ABNORMAL HIGH (ref 96–106)
Creatinine, Ser: 3.41 mg/dL — ABNORMAL HIGH (ref 0.57–1.00)
Globulin, Total: 2.4 g/dL (ref 1.5–4.5)
Glucose: 143 mg/dL — ABNORMAL HIGH (ref 70–99)
Potassium: 6.2 mmol/L — ABNORMAL HIGH (ref 3.5–5.2)
Sodium: 135 mmol/L (ref 134–144)
Total Protein: 6.2 g/dL (ref 6.0–8.5)
eGFR: 16 mL/min/1.73 — ABNORMAL LOW (ref >59)

## 2024-03-17 LAB — HEPATITIS B SURFACE ANTIBODY,QUALITATIVE: Hep B Surface Ab, Qual: NONREACTIVE

## 2024-03-17 LAB — CBC WITH DIFFERENTIAL/PLATELET
Basophils Absolute: 0.1 x10E3/uL (ref 0.0–0.2)
Basos: 1 %
EOS (ABSOLUTE): 0.3 x10E3/uL (ref 0.0–0.4)
Eos: 2 %
Hematocrit: 23.1 % — ABNORMAL LOW (ref 34.0–46.6)
Hemoglobin: 7.3 g/dL — ABNORMAL LOW (ref 11.1–15.9)
Immature Grans (Abs): 0.1 x10E3/uL (ref 0.0–0.1)
Immature Granulocytes: 1 %
Lymphocytes Absolute: 1.7 x10E3/uL (ref 0.7–3.1)
Lymphs: 13 %
MCH: 29 pg (ref 26.6–33.0)
MCHC: 31.6 g/dL (ref 31.5–35.7)
MCV: 92 fL (ref 79–97)
Monocytes Absolute: 0.8 x10E3/uL (ref 0.1–0.9)
Monocytes: 6 %
Neutrophils Absolute: 10.6 x10E3/uL — ABNORMAL HIGH (ref 1.4–7.0)
Neutrophils: 77 %
Platelets: 392 x10E3/uL (ref 150–450)
RBC: 2.52 x10E6/uL — CL (ref 3.77–5.28)
RDW: 14.6 % (ref 11.7–15.4)
WBC: 13.5 x10E3/uL — ABNORMAL HIGH (ref 3.4–10.8)

## 2024-03-17 LAB — LIPID PANEL
Chol/HDL Ratio: 5.2 ratio — ABNORMAL HIGH (ref 0.0–4.4)
Cholesterol, Total: 220 mg/dL — ABNORMAL HIGH (ref 100–199)
HDL: 42 mg/dL (ref >39)
LDL Chol Calc (NIH): 144 mg/dL — ABNORMAL HIGH (ref 0–99)
Triglycerides: 190 mg/dL — ABNORMAL HIGH (ref 0–149)
VLDL Cholesterol Cal: 34 mg/dL (ref 5–40)

## 2024-03-18 ENCOUNTER — Inpatient Hospital Stay (HOSPITAL_COMMUNITY): Payer: MEDICAID

## 2024-03-18 ENCOUNTER — Other Ambulatory Visit: Payer: Self-pay

## 2024-03-18 ENCOUNTER — Inpatient Hospital Stay (HOSPITAL_COMMUNITY)
Admission: EM | Admit: 2024-03-18 | Discharge: 2024-03-31 | DRG: 674 | Disposition: A | Discharge status: Home / Self Care | Payer: MEDICAID | Attending: Internal Medicine | Admitting: Internal Medicine

## 2024-03-18 ENCOUNTER — Encounter (HOSPITAL_COMMUNITY): Payer: Self-pay | Admitting: *Deleted

## 2024-03-18 ENCOUNTER — Ambulatory Visit: Payer: Self-pay | Admitting: Primary Care

## 2024-03-18 DIAGNOSIS — N920 Excessive and frequent menstruation with regular cycle: Secondary | ICD-10-CM | POA: Diagnosis present

## 2024-03-18 DIAGNOSIS — N186 End stage renal disease: Secondary | ICD-10-CM | POA: Diagnosis present

## 2024-03-18 DIAGNOSIS — Z7985 Long-term (current) use of injectable non-insulin antidiabetic drugs: Secondary | ICD-10-CM

## 2024-03-18 DIAGNOSIS — E1165 Type 2 diabetes mellitus with hyperglycemia: Secondary | ICD-10-CM | POA: Diagnosis present

## 2024-03-18 DIAGNOSIS — N250 Renal osteodystrophy: Secondary | ICD-10-CM | POA: Diagnosis present

## 2024-03-18 DIAGNOSIS — Z888 Allergy status to other drugs, medicaments and biological substances status: Secondary | ICD-10-CM

## 2024-03-18 DIAGNOSIS — Z603 Acculturation difficulty: Secondary | ICD-10-CM | POA: Diagnosis present

## 2024-03-18 DIAGNOSIS — D62 Acute posthemorrhagic anemia: Secondary | ICD-10-CM | POA: Diagnosis present

## 2024-03-18 DIAGNOSIS — I120 Hypertensive chronic kidney disease with stage 5 chronic kidney disease or end stage renal disease: Principal | ICD-10-CM | POA: Diagnosis present

## 2024-03-18 DIAGNOSIS — Z794 Long term (current) use of insulin: Secondary | ICD-10-CM

## 2024-03-18 DIAGNOSIS — Z79899 Other long term (current) drug therapy: Secondary | ICD-10-CM

## 2024-03-18 DIAGNOSIS — E875 Hyperkalemia: Secondary | ICD-10-CM | POA: Diagnosis present

## 2024-03-18 DIAGNOSIS — N8003 Adenomyosis of the uterus: Secondary | ICD-10-CM | POA: Diagnosis present

## 2024-03-18 DIAGNOSIS — E1122 Type 2 diabetes mellitus with diabetic chronic kidney disease: Secondary | ICD-10-CM | POA: Diagnosis present

## 2024-03-18 DIAGNOSIS — E7800 Pure hypercholesterolemia, unspecified: Secondary | ICD-10-CM | POA: Diagnosis present

## 2024-03-18 DIAGNOSIS — N179 Acute kidney failure, unspecified: Secondary | ICD-10-CM | POA: Diagnosis present

## 2024-03-18 DIAGNOSIS — E611 Iron deficiency: Secondary | ICD-10-CM | POA: Diagnosis present

## 2024-03-18 DIAGNOSIS — N390 Urinary tract infection, site not specified: Secondary | ICD-10-CM | POA: Diagnosis present

## 2024-03-18 DIAGNOSIS — D649 Anemia, unspecified: Principal | ICD-10-CM | POA: Diagnosis present

## 2024-03-18 DIAGNOSIS — B9620 Unspecified Escherichia coli [E. coli] as the cause of diseases classified elsewhere: Secondary | ICD-10-CM | POA: Diagnosis present

## 2024-03-18 DIAGNOSIS — Z833 Family history of diabetes mellitus: Secondary | ICD-10-CM

## 2024-03-18 DIAGNOSIS — N1832 Chronic kidney disease, stage 3b: Secondary | ICD-10-CM | POA: Diagnosis present

## 2024-03-18 DIAGNOSIS — D631 Anemia in chronic kidney disease: Secondary | ICD-10-CM | POA: Diagnosis present

## 2024-03-18 DIAGNOSIS — E872 Acidosis, unspecified: Secondary | ICD-10-CM | POA: Diagnosis not present

## 2024-03-18 DIAGNOSIS — K5909 Other constipation: Secondary | ICD-10-CM | POA: Diagnosis present

## 2024-03-18 DIAGNOSIS — I10 Essential (primary) hypertension: Secondary | ICD-10-CM | POA: Diagnosis present

## 2024-03-18 DIAGNOSIS — Z992 Dependence on renal dialysis: Secondary | ICD-10-CM

## 2024-03-18 DIAGNOSIS — E1140 Type 2 diabetes mellitus with diabetic neuropathy, unspecified: Secondary | ICD-10-CM | POA: Diagnosis present

## 2024-03-18 HISTORY — DX: End stage renal disease: N18.6

## 2024-03-18 LAB — PREPARE RBC (CROSSMATCH)

## 2024-03-18 LAB — CBC WITH DIFFERENTIAL/PLATELET
Abs Immature Granulocytes: 0.08 K/uL — ABNORMAL HIGH (ref 0.00–0.07)
Basophils Absolute: 0.1 K/uL (ref 0.0–0.1)
Basophils Relative: 1 %
Eosinophils Absolute: 0.2 K/uL (ref 0.0–0.5)
Eosinophils Relative: 1 %
HCT: 20.2 % — ABNORMAL LOW (ref 36.0–46.0)
Hemoglobin: 6.5 g/dL — CL (ref 12.0–15.0)
Immature Granulocytes: 1 %
Lymphocytes Relative: 10 %
Lymphs Abs: 1.6 K/uL (ref 0.7–4.0)
MCH: 29 pg (ref 26.0–34.0)
MCHC: 32.2 g/dL (ref 30.0–36.0)
MCV: 90.2 fL (ref 80.0–100.0)
Monocytes Absolute: 0.8 K/uL (ref 0.1–1.0)
Monocytes Relative: 5 %
Neutro Abs: 14 K/uL — ABNORMAL HIGH (ref 1.7–7.7)
Neutrophils Relative %: 82 %
Platelets: 349 K/uL (ref 150–400)
RBC: 2.24 MIL/uL — ABNORMAL LOW (ref 3.87–5.11)
RDW: 15.1 % (ref 11.5–15.5)
WBC: 16.7 K/uL — ABNORMAL HIGH (ref 4.0–10.5)
nRBC: 0 % (ref 0.0–0.2)

## 2024-03-18 LAB — COMPREHENSIVE METABOLIC PANEL WITH GFR
ALT: 11 U/L (ref 0–44)
AST: 13 U/L — ABNORMAL LOW (ref 15–41)
Albumin: 3 g/dL — ABNORMAL LOW (ref 3.5–5.0)
Alkaline Phosphatase: 68 U/L (ref 38–126)
Anion gap: 7 (ref 5–15)
BUN: 49 mg/dL — ABNORMAL HIGH (ref 6–20)
CO2: 15 mmol/L — ABNORMAL LOW (ref 22–32)
Calcium: 8.3 mg/dL — ABNORMAL LOW (ref 8.9–10.3)
Chloride: 113 mmol/L — ABNORMAL HIGH (ref 98–111)
Creatinine, Ser: 3.52 mg/dL — ABNORMAL HIGH (ref 0.44–1.00)
GFR, Estimated: 15 mL/min — ABNORMAL LOW (ref >60)
Glucose, Bld: 87 mg/dL (ref 70–99)
Potassium: 5.4 mmol/L — ABNORMAL HIGH (ref 3.5–5.1)
Sodium: 135 mmol/L (ref 135–145)
Total Bilirubin: 0.5 mg/dL (ref 0.0–1.2)
Total Protein: 6.1 g/dL — ABNORMAL LOW (ref 6.5–8.1)

## 2024-03-18 LAB — POC OCCULT BLOOD, ED: Fecal Occult Bld: NEGATIVE

## 2024-03-18 MED ORDER — SODIUM CHLORIDE 0.9% IV SOLUTION: Freq: Once | INTRAVENOUS | Status: DC

## 2024-03-18 MED ORDER — LABETALOL HCL 5 MG/ML IV SOLN
10.0000 mg | INTRAVENOUS | Status: DC | PRN
  Administered 2024-03-19 – 2024-03-23 (×3): 10 mg via INTRAVENOUS
  Filled 2024-03-18 (×5): qty 4

## 2024-03-18 MED ORDER — ROSUVASTATIN CALCIUM 5 MG PO TABS
10.0000 mg | ORAL_TABLET | Freq: Every day | ORAL | Status: DC
  Administered 2024-03-19 – 2024-03-31 (×10): 10 mg via ORAL
  Filled 2024-03-18 (×12): qty 2

## 2024-03-18 MED ORDER — ONDANSETRON HCL 4 MG PO TABS
4.0000 mg | ORAL_TABLET | Freq: Four times a day (QID) | ORAL | Status: DC | PRN
  Administered 2024-03-21 – 2024-03-26 (×3): 4 mg via ORAL
  Filled 2024-03-18 (×3): qty 1

## 2024-03-18 MED ORDER — INSULIN ASPART 100 UNIT/ML IJ SOLN: 0.0000 [IU] | Freq: Every day | INTRAMUSCULAR | Status: DC

## 2024-03-18 MED ORDER — LACTATED RINGERS IV SOLN: INTRAVENOUS | Status: DC

## 2024-03-18 MED ORDER — INSULIN ASPART 100 UNIT/ML IJ SOLN
0.0000 [IU] | Freq: Three times a day (TID) | INTRAMUSCULAR | Status: DC
  Administered 2024-03-20: 2 [IU] via SUBCUTANEOUS
  Administered 2024-03-20: 3 [IU] via SUBCUTANEOUS
  Administered 2024-03-20: 2 [IU] via SUBCUTANEOUS
  Administered 2024-03-21: 3 [IU] via SUBCUTANEOUS
  Administered 2024-03-21 – 2024-03-22 (×3): 2 [IU] via SUBCUTANEOUS
  Administered 2024-03-22: 5 [IU] via SUBCUTANEOUS
  Administered 2024-03-22: 2 [IU] via SUBCUTANEOUS
  Administered 2024-03-23: 3 [IU] via SUBCUTANEOUS
  Administered 2024-03-23: 5 [IU] via SUBCUTANEOUS
  Administered 2024-03-23 – 2024-03-24 (×2): 3 [IU] via SUBCUTANEOUS

## 2024-03-18 MED ORDER — INSULIN GLARGINE-YFGN 100 UNIT/ML ~~LOC~~ SOLN
26.0000 [IU] | Freq: Every day | SUBCUTANEOUS | Status: DC
  Filled 2024-03-18: qty 0.26

## 2024-03-18 MED ORDER — ONDANSETRON HCL 4 MG/2ML IJ SOLN
4.0000 mg | Freq: Four times a day (QID) | INTRAMUSCULAR | Status: DC | PRN
  Administered 2024-03-19 – 2024-03-23 (×5): 4 mg via INTRAVENOUS
  Filled 2024-03-18 (×5): qty 2

## 2024-03-18 NOTE — ED Provider Notes (Signed)
  EMERGENCY DEPARTMENT AT [facility] Provider Note   CSN: [id number] Arrival date & time: 03/18/24  1925     Patient presents with: No chief complaint on file.   Michele Morales is a 48 y.o. female.   48 year old female presenting with abnormal labs.  Patient was seen by her primary care provider several days ago, was called today and instructed to come to the emergency department due to abnormal labs, specifically a low hemoglobin and elevated creatinine.  Patient reports feeling fatigued and short of breath at times, she is unable to tell me how long this has been going on.  Reports heavy menses, last menstrual period was approximately 2 weeks ago, reports that she was having to change her pad/tampon every 1-2 hours, which is a bit heavier than her normal.  Denies any rectal bleeding/melena/hematochezia.  She has experienced nausea at times as well but no abdominal pain, she notes occasional burning with urination.  She has never required a blood transfusion in the past.  She is on medication for hypertension, hyperlipidemia, and insulin  for diabetes.  Spanish interpreter services were used throughout the duration of this encounter.        Prior to Admission medications   Medication Sig Start Date End Date Taking? Authorizing Provider  benzonatate  (TESSALON ) 100 MG capsule Take 1 to 2 capsules by mouth 3 times daily as needed. 06/17/23   Mayers, Cari S, PA-C  Blood Glucose Monitoring Suppl (TRUE METRIX METER) w/Device KIT Use to check blood sugar three times daily. 01/10/22   Newlin, Enobong, MD  cetirizine  (ZYRTEC  ALLERGY) 10 MG tablet Take 1 tablet (10 mg total) by mouth daily. 06/17/23   Mayers, Cari S, PA-C  Dulaglutide  (TRULICITY ) 1.5 MG/0.5ML SOAJ Inject 1.5 mg into the skin once a week. 03/16/24   Celestia Rosaline SQUIBB, NP  glucose blood (TRUE METRIX BLOOD GLUCOSE TEST) test strip Use to check blood sugar three times daily. 07/16/23   Newlin, Enobong, MD   hydrochlorothiazide  (HYDRODIURIL ) 25 MG tablet Take 1 tablet (25 mg total) by mouth daily. 03/16/24   Celestia Rosaline SQUIBB, NP  Insulin  Glargine (BASAGLAR  KWIKPEN) 100 UNIT/ML Inject 26 Units into the skin daily. 03/16/24   Celestia Rosaline SQUIBB, NP  Insulin  Pen Needle (PENTIPS) 32G X 4 MM MISC use as directed 06/17/23   Mayers, Cari S, PA-C  lisinopril  (ZESTRIL ) 20 MG tablet Take 1 tablet (20 mg total) by mouth daily. 03/16/24   Celestia Rosaline SQUIBB, NP  rosuvastatin  (CRESTOR ) 10 MG tablet Take 1 tablet (10 mg total) by mouth daily. 06/17/23   Mayers, Cari S, PA-C  TRUEplus Lancets 28G MISC Use to check blood sugar three times daily. 07/16/23   Delbert Clam, MD    Allergies: Amlodipine     Review of Systems  Updated Vital Signs  Vitals:   03/18/24 1942 03/18/24 2105 03/18/24 2110 03/18/24 2200  BP:  (!) 181/102  (!) 166/90  Pulse:  (!) 105 (!) 104 (!) 105  Resp:  18  15  Temp:      SpO2:  100% 100% 100%  Weight: 57.2 kg     Height: 4' 11 (1.499 m)        Physical Exam Vitals and nursing note reviewed.  HENT:     Head: Normocephalic.  Eyes:     Extraocular Movements: Extraocular movements intact.  Cardiovascular:     Rate and Rhythm: Regular rhythm. Tachycardia present.  Pulmonary:     Effort: Pulmonary effort is normal.  Breath sounds: Normal breath sounds.  Abdominal:     Palpations: Abdomen is soft.     Tenderness: There is abdominal tenderness (suprapubic). There is no guarding.  Genitourinary:    Comments: DRE performed with nurse Abby at the bedside as chaperone  External skin tags present likely from prior hemorrhoids, no thrombosed hemorrhoids visualized, no anal lesions/masses visualized, rectal vault without palpable masses, no obvious blood noted following DRE Musculoskeletal:     Comments: Moves all extremities spontaneously without difficulty  Skin:    General: Skin is warm and dry.  Neurological:     Mental Status: She is alert and oriented to person,  place, and time.     (all labs ordered are listed, but only abnormal results are displayed) Labs Reviewed  CBC WITH DIFFERENTIAL/PLATELET - Abnormal; Notable for the following components:      Result Value   WBC 16.7 (*)    RBC 2.24 (*)    Hemoglobin 6.5 (*)    HCT 20.2 (*)    Neutro Abs 14.0 (*)    Abs Immature Granulocytes 0.08 (*)    All other components within normal limits  COMPREHENSIVE METABOLIC PANEL WITH GFR - Abnormal; Notable for the following components:   Potassium 5.4 (*)    Chloride 113 (*)    CO2 15 (*)    BUN 49 (*)    Creatinine, Ser 3.52 (*)    Calcium  8.3 (*)    Total Protein 6.1 (*)    Albumin 3.0 (*)    AST 13 (*)    GFR, Estimated 15 (*)    All other components within normal limits  URINALYSIS, ROUTINE W REFLEX MICROSCOPIC  POC OCCULT BLOOD, ED  TYPE AND SCREEN  PREPARE RBC (CROSSMATCH)    EKG: None  Radiology: No results found.   .Critical Care  Performed by: Glendia Rocky SAILOR, PA-C Authorized by: Glendia Rocky SAILOR, PA-C   Critical care provider statement:    Critical care time (minutes):  34   Critical care time was exclusive of:  Separately billable procedures and treating other patients and teaching time   Critical care was necessary to treat or prevent imminent or life-threatening deterioration of the following conditions: Symptomatic anemia requiring blood transfusion.   Critical care was time spent personally by me on the following activities:  Ordering and performing treatments and interventions, development of treatment plan with patient or surrogate, ordering and review of laboratory studies, discussions with consultants, evaluation of patient's response to treatment, re-evaluation of patient's condition, examination of patient and review of old charts   I assumed direction of critical care for this patient from another provider in my specialty: no     Care discussed with: admitting provider      Medications Ordered in the ED  0.9 %   sodium chloride  infusion (Manually program via Guardrails IV Fluids) (has no administration in time range)                                    Medical Decision Making This patient presents to the ED for concern of abnormal labs, this involves an extensive number of treatment options, and is a complaint that carries with it a high risk of complications and morbidity.  The differential diagnosis includes anemia secondary to GI loss, anemia secondary to vaginal bleeding, anemia in the setting of severe iron deficiency, anemia secondary to poor diet, anemia due to  unknown cause, acute kidney injury.   Co morbidities that complicate the patient evaluation  Diabetes, hypertension, hyperlipidemia   Additional history obtained:  Additional history obtained from record review External records from outside source obtained and reviewed including PCP note   Lab Tests:  I Ordered, and personally interpreted labs.  The pertinent results include: CBC notable for leukocytosis with white blood cell count of 16.7, anemia with hemoglobin of 6.5.  CMP notable for mild hyperkalemia with potassium of 5.4, creatinine significantly elevated at 3.52.  Urinalysis pending at time of admission.    Cardiac Monitoring: / EKG:  The patient was maintained on a cardiac monitor.  I personally viewed and interpreted the cardiac monitored which showed an underlying rhythm of: Sinus tachycardia   Consultations Obtained:  I requested consultation with the hospitalist service,  and discussed lab and imaging findings as well as pertinent plan - they recommend: Spoke with Dr. Trudi who agrees that this patient is appropriate for admission for blood transfusion and correction of acute kidney injury.   Problem List / ED Course / Critical interventions / Medication management   I ordered medication including 1 unit PRBC for symptomatic anemia I have reviewed the patients home medicines and have made adjustments as  needed   Test / Admission - Considered:  Physical exam notable as above.  There is no obvious source of bleeding that the patient reports, she did have a heavy menstrual cycle several weeks ago but otherwise no additional vaginal bleeding, there was no obvious bleeding on DRE and Hemoccult was negative.  I initiated transfusion with 1 unit PRBCs in the emergency department this evening, I spoke with the hospitalist service, see above for their recommendations.  This patient is appropriate for admission.    Amount and/or Complexity of Data Reviewed Labs: ordered.  Risk Prescription drug management. Decision regarding hospitalization.        Final diagnoses:  Anemia, unspecified type  Acute kidney injury [facility])    ED Discharge Orders     None          Glendia Rocky LOISE DEVONNA 03/18/24 2236    Lenor Hollering, MD 03/18/24 [phone]

## 2024-03-18 NOTE — H&P (Signed)
 History and Physical    Patient: Michele Morales FMW:[id number] DOB: Aug 01, 1976 DOA: 03/18/2024 DOS: the patient was seen and examined on 03/18/2024 PCP: Celestia Rosaline SQUIBB, NP  Patient coming from: Home  Chief Complaint: No chief complaint on file.  HPI: [name] is a 48 y.o. female with medical history significant of insulin -dependent diabetes, essential hypertension, hyperlipidemia, diabetic neuropathy who presented to the ER after she was asked by her PCP to come to the ER.  Patient was seen a few days ago on 723.  Blood work was performed showing elevated creatinine as well as low hemoglobin.  Patient's creatinine was 2.5 at the time and now has risen to more than 3.  She is on lisinopril  and hydrochlorothiazide .  Patient also has reported heavy menstrual period the last 1 was about 2 weeks ago.  She was changing her tampon about 1 to 2 hours.  No rectal bleeding no melena no hematochezia.  Otherwise no other symptoms.  In the ER patient was found to have Hemoglobin of 6.5.  It was 7.32 days ago with MCV of 90.  She also has a creatinine of 3.52 Was previous baseline of 1.5 in November and 2.05 in January of this year  Review of Systems: As mentioned in the history of present illness. All other systems reviewed and are negative. Past Medical History:  Diagnosis Date   Diabetes mellitus without complication (HCC)    IDDM   Hypercholesteremia    Hypertension    Neuromuscular disorder (HCC)    diabetic neuropathy feet   Past Surgical History:  Procedure Laterality Date   hand procedure Right    right wrist - used sedation to reset in the ER Dept   ORIF WRIST FRACTURE Right 10/15/2022   Procedure: OPEN REDUCTION INTERNAL FIXATION (ORIF) WRIST FRACTURE;  Surgeon: Shari Easter, MD;  Location: [facility] OR;  Service: Orthopedics;  Laterality: Right;  regional with iv sedation   Social History:  reports that she has never smoked. She has never used smokeless tobacco. She reports that she  does not drink alcohol and does not use drugs.  Allergies  Allergen Reactions   Amlodipine  Swelling    BLE edema    Family History  Problem Relation Age of Onset   Diabetes Mother     Prior to Admission medications   Medication Sig Start Date End Date Taking? Authorizing Provider  benzonatate  (TESSALON ) 100 MG capsule Take 1 to 2 capsules by mouth 3 times daily as needed. 06/17/23   Mayers, Cari S, PA-C  Blood Glucose Monitoring Suppl (TRUE METRIX METER) w/Device KIT Use to check blood sugar three times daily. 01/10/22   Newlin, Enobong, MD  cetirizine  (ZYRTEC  ALLERGY) 10 MG tablet Take 1 tablet (10 mg total) by mouth daily. 06/17/23   Mayers, Cari S, PA-C  Dulaglutide  (TRULICITY ) 1.5 MG/0.5ML SOAJ Inject 1.5 mg into the skin once a week. 03/16/24   Celestia Rosaline SQUIBB, NP  glucose blood (TRUE METRIX BLOOD GLUCOSE TEST) test strip Use to check blood sugar three times daily. 07/16/23   Newlin, Enobong, MD  hydrochlorothiazide  (HYDRODIURIL ) 25 MG tablet Take 1 tablet (25 mg total) by mouth daily. 03/16/24   Celestia Rosaline SQUIBB, NP  Insulin  Glargine (BASAGLAR  KWIKPEN) 100 UNIT/ML Inject 26 Units into the skin daily. 03/16/24   Celestia Rosaline SQUIBB, NP  Insulin  Pen Needle (PENTIPS) 32G X 4 MM MISC use as directed 06/17/23   Mayers, Cari S, PA-C  lisinopril  (ZESTRIL ) 20 MG tablet Take 1 tablet (20 mg  total) by mouth daily. 03/16/24   Celestia Rosaline SQUIBB, NP  rosuvastatin  (CRESTOR ) 10 MG tablet Take 1 tablet (10 mg total) by mouth daily. 06/17/23   Mayers, Cari S, PA-C  TRUEplus Lancets 28G MISC Use to check blood sugar three times daily. 07/16/23   Delbert Clam, MD    Physical Exam: Vitals:   03/18/24 1942 03/18/24 2105 03/18/24 2110 03/18/24 2200  BP:  (!) 181/102  (!) 166/90  Pulse:  (!) 105 (!) 104 (!) 105  Resp:  18  15  Temp:      SpO2:  100% 100% 100%  Weight: 57.2 kg     Height: 4' 11 (1.499 m)      Constitutional: NAD, calm, comfortable Eyes: PERRL, lids and conjunctivae  pale ENMT: Mucous membranes are moist. Posterior pharynx clear of any exudate or lesions.Normal dentition.  Neck: normal, supple, no masses, no thyromegaly Respiratory: clear to auscultation bilaterally, no wheezing, no crackles. Normal respiratory effort. No accessory muscle use.  Cardiovascular: Sinus tachycardia, no murmurs / rubs / gallops. No extremity edema. 2+ pedal pulses. No carotid bruits.  Abdomen: no tenderness, no masses palpated. No hepatosplenomegaly. Bowel sounds positive.  Musculoskeletal: Good range of motion, no joint swelling or tenderness, Skin: no rashes, lesions, ulcers. No induration Neurologic: CN 2-12 grossly intact. Sensation intact, DTR normal. Strength 5/5 in all 4.  Psychiatric: Normal judgment and insight. Alert and oriented x 3. Normal mood  Data Reviewed:  Heart rate 105 blood pressure 190/95, white count 16.7 hemoglobin 6.5 sodium 135 potassium is 5.4 chloride 113 CO2 15 BUN 49 creatinine 3.52 and calcium  8.3 fecal occult blood testing is negative x 2  Assessment and Plan:  #1 symptomatic anemia: Patient most likely has anemia as a result of recent blood loss.  MCV is 90.  We will check B12 and folic acid.  Does not appear to be iron deficiency at this point.  Patient will also be transfused 2 units of packed red blood cells.  We will monitor H&H.  She will more than likely need follow-up with OB/GYN at discharge.  #2 insulin -dependent diabetes: Will continue with Lantus  and sliding scale insulin .  Continue to monitor  #3 essential hypertension: Patient on lisinopril  with hydrochlorothiazide  which we will hold due to AKI.  Patient will benefit from hydralazine and beta-blocker at the moment.  #4 hyperkalemia: Probably related to AKI on CKD with lisinopril  on board.  Will hold lisinopril .  Recheck blood work if hyperkalemia persist will trial Lokelma  #5 AKI on CKD 3B: Will hydrate and monitor renal function.  Will take away both hydrochlorothiazide  and  lisinopril .  Patient may benefit from renal ultrasound also.     Advance Care Planning:   Code Status: Full Code   Consults: None  Family Communication: Husband at bedside  Severity of Illness: The appropriate patient status for this patient is INPATIENT. Inpatient status is judged to be reasonable and necessary in order to provide the required intensity of service to ensure the patient's safety. The patient's presenting symptoms, physical exam findings, and initial radiographic and laboratory data in the context of their chronic comorbidities is felt to place them at high risk for further clinical deterioration. Furthermore, it is not anticipated that the patient will be medically stable for discharge from the hospital within 2 midnights of admission.   * I certify that at the point of admission it is my clinical judgment that the patient will require inpatient hospital care spanning beyond 2 midnights from the  point of admission due to high intensity of service, high risk for further deterioration and high frequency of surveillance required.*  AuthorBETHA SIM KNOLL, MD 03/18/2024 11:19 PM  For on call review [email or web address].

## 2024-03-18 NOTE — ED Provider Triage Note (Signed)
 Emergency Medicine Provider Triage Evaluation Note  Michele Morales , a 48 y.o. female  was evaluated in triage.  Pt complains of abnormal labs.  Was sent in due to declined kidney function.  It appears that 2 days ago patient's GFR went from 38-16.  Potassium was 6.2, patient notes no symptoms or change in urination.  Review of Systems  Positive: Abnormal labs  Negative: NVD  Physical Exam  BP (!) 190/95 (BP Location: Right Arm)   Pulse (!) 101   Temp 97.8 F (36.6 C)   Resp 20   Ht 4' 11 (1.499 m)   Wt 57.2 kg   LMP 03/04/2024   SpO2 99%   BMI 25.47 kg/m  Gen:   Awake, no distress  Resp:  Normal effort  MSK:   Moves extremities without difficulty  Other:    Medical Decision Making  Medically screening exam initiated at 8:10 PM.  Appropriate orders placed.  Michele Morales was informed that the remainder of the evaluation will be completed by another provider, this initial triage assessment does not replace that evaluation, and the importance of remaining in the ED until their evaluation is complete.     Shermon Warren SAILOR, Michele Morales 03/18/24 2011

## 2024-03-18 NOTE — ED Triage Notes (Addendum)
 Patient arrives POV c/o abnormal labs. Patient is spanish-speaking; per daughter on phone, patient was seen at PCP on Wednesday and had labs drawn. Patient received call today from PCP office instructing her to go to ED for further evaluation due to abnormal kidney function.   Patient denies urinary symptoms; denies increased urinary frequency, urgency or burning on urination. Patient reports urinating 2 times daily. Patient was hypertensive in triage with BP 190/95.

## 2024-03-19 ENCOUNTER — Inpatient Hospital Stay (HOSPITAL_COMMUNITY): Payer: Self-pay

## 2024-03-19 ENCOUNTER — Inpatient Hospital Stay (HOSPITAL_COMMUNITY): Payer: MEDICAID

## 2024-03-19 LAB — URINALYSIS, ROUTINE W REFLEX MICROSCOPIC
Bilirubin Urine: NEGATIVE
Glucose, UA: NEGATIVE mg/dL
Hgb urine dipstick: NEGATIVE
Ketones, ur: NEGATIVE mg/dL
Nitrite: NEGATIVE
Protein, ur: 100 mg/dL — AB
Specific Gravity, Urine: 1.006 (ref 1.005–1.030)
WBC, UA: >50 WBC/hpf (ref 0–5)
pH: 6 (ref 5.0–8.0)

## 2024-03-19 LAB — CBC
HCT: 23.1 % — ABNORMAL LOW (ref 36.0–46.0)
Hemoglobin: 7.8 g/dL — ABNORMAL LOW (ref 12.0–15.0)
MCH: 29.4 pg (ref 26.0–34.0)
MCHC: 33.8 g/dL (ref 30.0–36.0)
MCV: 87.2 fL (ref 80.0–100.0)
Platelets: 301 K/uL (ref 150–400)
RBC: 2.65 MIL/uL — ABNORMAL LOW (ref 3.87–5.11)
RDW: 15.3 % (ref 11.5–15.5)
WBC: 14.3 K/uL — ABNORMAL HIGH (ref 4.0–10.5)
nRBC: 0 % (ref 0.0–0.2)

## 2024-03-19 LAB — CBG MONITORING, ED
Glucose-Capillary: 152 mg/dL — ABNORMAL HIGH (ref 70–99)
Glucose-Capillary: 91 mg/dL (ref 70–99)

## 2024-03-19 LAB — BASIC METABOLIC PANEL WITH GFR
Anion gap: 7 (ref 5–15)
BUN: 46 mg/dL — ABNORMAL HIGH (ref 6–20)
CO2: 15 mmol/L — ABNORMAL LOW (ref 22–32)
Calcium: 8 mg/dL — ABNORMAL LOW (ref 8.9–10.3)
Chloride: 112 mmol/L — ABNORMAL HIGH (ref 98–111)
Creatinine, Ser: 3.31 mg/dL — ABNORMAL HIGH (ref 0.44–1.00)
GFR, Estimated: 17 mL/min — ABNORMAL LOW (ref >60)
Glucose, Bld: 110 mg/dL — ABNORMAL HIGH (ref 70–99)
Potassium: 5.1 mmol/L (ref 3.5–5.1)
Sodium: 134 mmol/L — ABNORMAL LOW (ref 135–145)

## 2024-03-19 LAB — VITAMIN B12: Vitamin B-12: 314 pg/mL (ref 180–914)

## 2024-03-19 LAB — IRON AND TIBC
Iron: 169 ug/dL (ref 28–170)
Saturation Ratios: 46 % — ABNORMAL HIGH (ref 10.4–31.8)
TIBC: 367 ug/dL (ref 250–450)
UIBC: 198 ug/dL

## 2024-03-19 LAB — GLUCOSE, CAPILLARY
Glucose-Capillary: 101 mg/dL — ABNORMAL HIGH (ref 70–99)
Glucose-Capillary: 118 mg/dL — ABNORMAL HIGH (ref 70–99)
Glucose-Capillary: 160 mg/dL — ABNORMAL HIGH (ref 70–99)

## 2024-03-19 LAB — HEMOGLOBIN A1C
Hgb A1c MFr Bld: 7.1 % — ABNORMAL HIGH (ref 4.8–5.6)
Mean Plasma Glucose: 157.07 mg/dL

## 2024-03-19 LAB — TSH: TSH: 3.521 u[IU]/mL (ref 0.350–4.500)

## 2024-03-19 LAB — FOLATE: Folate: 12.9 ng/mL (ref >5.9)

## 2024-03-19 LAB — PREPARE RBC (CROSSMATCH)

## 2024-03-19 LAB — HIV ANTIBODY (ROUTINE TESTING W REFLEX): HIV Screen 4th Generation wRfx: NONREACTIVE

## 2024-03-19 MED ORDER — SODIUM CHLORIDE 0.9 % IV SOLN
2.0000 g | INTRAVENOUS | Status: AC
  Administered 2024-03-19 – 2024-03-23 (×5): 2 g via INTRAVENOUS
  Filled 2024-03-19 (×5): qty 20

## 2024-03-19 MED ORDER — INSULIN GLARGINE-YFGN 100 UNIT/ML ~~LOC~~ SOLN
5.0000 [IU] | Freq: Every day | SUBCUTANEOUS | Status: DC
  Filled 2024-03-19: qty 0.05

## 2024-03-19 MED ORDER — HYDRALAZINE HCL 25 MG PO TABS
25.0000 mg | ORAL_TABLET | Freq: Two times a day (BID) | ORAL | Status: DC
  Administered 2024-03-19 (×2): 25 mg via ORAL
  Filled 2024-03-19 (×2): qty 1

## 2024-03-19 MED ORDER — SODIUM CHLORIDE 0.45 % IV SOLN
INTRAVENOUS | Status: DC
  Filled 2024-03-19 (×6): qty 75

## 2024-03-19 MED ORDER — SODIUM CHLORIDE 0.9% IV SOLUTION: Freq: Once | INTRAVENOUS | Status: AC

## 2024-03-19 MED ORDER — CYANOCOBALAMIN 1000 MCG/ML IJ SOLN
1000.0000 ug | Freq: Every day | INTRAMUSCULAR | Status: AC
  Administered 2024-03-19 – 2024-03-21 (×3): 1000 ug via INTRAMUSCULAR
  Filled 2024-03-19 (×3): qty 1

## 2024-03-19 MED ORDER — INSULIN GLARGINE-YFGN 100 UNIT/ML ~~LOC~~ SOLN: 10.0000 [IU] | Freq: Every day | SUBCUTANEOUS | Status: DC

## 2024-03-19 NOTE — ED Notes (Signed)
 Provide Regalado MD ask RN to hold semglee  5 units if CBG at 1000 was less then <180

## 2024-03-19 NOTE — Plan of Care (Signed)
  Problem: Coping: Goal: Ability to adjust to condition or change in health will improve Outcome: Progressing   Problem: Nutritional: Goal: Maintenance of adequate nutrition will improve Outcome: Progressing Goal: Progress toward achieving an optimal weight will improve Outcome: Progressing   Problem: Health Behavior/Discharge Planning: Goal: Ability to manage health-related needs will improve Outcome: Progressing   Problem: Clinical Measurements: Goal: Will remain free from infection Outcome: Progressing Goal: Diagnostic test results will improve Outcome: Progressing Goal: Respiratory complications will improve Outcome: Progressing

## 2024-03-19 NOTE — Progress Notes (Signed)
 ED nurse call and reported that per Attending to hold lantus  if blood sugar is less than 180

## 2024-03-19 NOTE — ED Notes (Addendum)
 Courtesy call given to 6N for pt coming from [place] , earlier note mentioned to charge.

## 2024-03-19 NOTE — Progress Notes (Signed)
 PROGRESS NOTE    Michele Morales  FMW:[id number] DOB: 1975/10/15 DOA: 03/18/2024 PCP: Celestia Rosaline SQUIBB, NP   Brief Narrative: 48 year old with past medical history significant for insulin -dependent diabetes, hypertension, hyperlipidemia, diabetic neuropathy presented to the ER referred by her PCP due to abnormal lab work performed on 7/23.  She was found to have a creatinine of 2.5 and hemoglobin was also low at 6.5.  She report menorrhagia.  Patient was found to be in AKI with a creatinine of 3.4, metabolic acidosis with a bicarb of 14, potassium 5.4.  Hemoglobin 6.5.     Assessment & Plan:   Principal Problem:   Symptomatic anemia Active Problems:   Type 2 diabetes mellitus with hyperglycemia, with long-term current use of insulin  (HCC)   Essential hypertension   Hyperkalemia   Acute kidney injury superimposed on stage 3b chronic kidney disease (HCC)   1-Acute on chronic anemia, symptomatic: -In the setting of menorrhagia -Patient presented with a hemoglobin of 6.5, prior hemoglobin 9.5-11 -Continue to hold lisinopril  and hydrochlorothiazide  -She has received 1 units of packed red blood cells ordered Plan to transfuse another unit.  B 12 314, start supplement, Folate 12, iron  169, T sat 46 Anemia could be related to blood loss anemia and anemia of chronic renal diseases.   Menorrhagia:  TSH: 3.5 Will check pelvic US .  Will need GYN referral. Denies currently bleeding.   Pyuria;  Will start IV antibiotics.  Concern for UTI, in the setting of AKI Follow Urine culture.   AKI on CKD stage IIIb Metabolic acidosis, hyperkalemia -Patient previous creatinine range 1.4---1.6 -2 months ago creatinine of 2.04--- presented with a creatinine of 3.5 -Blood transfusion, IV fluids/bicarb gtt.   Follow trend.  -Renal US : Minimal fullness of the left collecting system likely related to the distended bladder. Will do bladder scan and Strict I and o/   Insulin -dependent  diabetes: -CBG low side. Will hold long acting insulin .  SSI  Essential hypertension -Start Hydralazine .  Hold lisinopril  and hydrochlorothiazide >    Estimated body mass index is 25.47 kg/m as calculated from the following:   Height as of this encounter: 4' 11 (1.499 m).   Weight as of this encounter: 57.2 kg.   DVT prophylaxis: SCD Code Status: Full code Family Communication: Crae discussed with patient and husband.  Disposition Plan:  Status is: Inpatient Remains inpatient appropriate because: management of Anemia, UTI, AKI     Consultants:  None  Procedures:    Antimicrobials:    Subjective: She report no vaginal bleeding currently. She denies pain. She denies bloody stool. She report heavy and irregular menstrual periods.  She is still feeling dizzy.   Objective: Vitals:   03/19/24 0606 03/19/24 0615 03/19/24 0630 03/19/24 0700  BP: (!) 168/76 (!) 167/79 (!) 164/77 (!) 165/80  Pulse: 84 88 86 86  Resp: 13 11 13 11   Temp: 97.9 F (36.6 C)     TempSrc: Oral     SpO2: 100% 100% 100% 100%  Weight:      Height:        Intake/Output Summary (Last 24 hours) at 03/19/2024 0810 Last data filed at 03/19/2024 0257 Gross per 24 hour  Intake 441.67 ml  Output --  Net 441.67 ml   Filed Weights   03/18/24 1942  Weight: 57.2 kg    Examination:  General exam: Appears calm and comfortable  Respiratory system: Clear to auscultation. Respiratory effort normal. Cardiovascular system: S1 & S2 heard, RRR. No JVD, murmurs, rubs, gallops  or clicks. No pedal edema. Gastrointestinal system: Abdomen is nondistended, soft and nontender. No organomegaly or masses felt. Normal bowel sounds heard. Central nervous system: Alert and oriented. No focal neurological deficits. Extremities: Symmetric 5 x 5 power.   Data Reviewed: I have personally reviewed following labs and imaging studies  CBC: Recent Labs  Lab 03/16/24 0958 03/18/24 2015  WBC 13.5* 16.7*  NEUTROABS  10.6* 14.0*  HGB 7.3* 6.5*  HCT 23.1* 20.2*  MCV 92 90.2  PLT 392 349   Basic Metabolic Panel: Recent Labs  Lab 03/16/24 0958 03/18/24 2015  NA 135 135  K 6.2* 5.4*  CL 110* 113*  CO2 14* 15*  GLUCOSE 143* 87  BUN 50* 49*  CREATININE 3.41* 3.52*  CALCIUM  8.5* 8.3*   GFR: Estimated Creatinine Clearance: 15.1 mL/min (A) (by C-G formula based on SCr of 3.52 mg/dL (H)). Liver Function Tests: Recent Labs  Lab 03/16/24 0958 03/18/24 2015  AST 10 13*  ALT 7 11  ALKPHOS 84 68  BILITOT <0.2 0.5  PROT 6.2 6.1*  ALBUMIN 3.8* 3.0*   No results for input(s): LIPASE, AMYLASE in the last 168 hours. No results for input(s): AMMONIA in the last 168 hours. Coagulation Profile: No results for input(s): INR, PROTIME in the last 168 hours. Cardiac Enzymes: No results for input(s): CKTOTAL, CKMB, CKMBINDEX, TROPONINI in the last 168 hours. BNP (last 3 results) No results for input(s): PROBNP in the last 8760 hours. HbA1C: Recent Labs    03/16/24 0907  HGBA1C 7.5*   CBG: Recent Labs  Lab 03/19/24 0012 03/19/24 0713  GLUCAP 152* 91   Lipid Profile: Recent Labs    03/16/24 0958  CHOL 220*  HDL 42  LDLCALC 144*  TRIG 190*  CHOLHDL 5.2*   Thyroid Function Tests: No results for input(s): TSH, T4TOTAL, FREET4, T3FREE, THYROIDAB in the last 72 hours. Anemia Panel: No results for input(s): VITAMINB12, FOLATE, FERRITIN, TIBC, IRON , RETICCTPCT in the last 72 hours. Sepsis Labs: No results for input(s): PROCALCITON, LATICACIDVEN in the last 168 hours.  No results found for this or any previous visit (from the past 240 hours).       Radiology Studies: US  RENAL Result Date: 03/19/2024 CLINICAL DATA:  Acute renal injury EXAM: RENAL / URINARY TRACT ULTRASOUND COMPLETE COMPARISON:  None Available. FINDINGS: Right Kidney: Renal measurements: 9.8 x 4.6 x 5.2 cm. = volume: 125 mL. Echogenicity within normal limits. No mass or  hydronephrosis visualized. Left Kidney: Renal measurements: 10.9 x 5.9 x 5.8 cm. = volume: 194 mL. Minimal fullness of the left collecting system is noted. This persists following voiding although significant decompression of the bladder was not seen. Bladder: Appears normal for degree of bladder distention. Other: None. IMPRESSION: Minimal fullness of the left collecting system likely related to the distended bladder. Electronically Signed   By: Oneil Devonshire M.D.   On: 03/19/2024 01:44        Scheduled Meds:  sodium chloride    Intravenous Once   insulin  aspart  0-15 Units Subcutaneous TID WC   insulin  aspart  0-5 Units Subcutaneous QHS   insulin  glargine-yfgn  26 Units Subcutaneous Daily   rosuvastatin   10 mg Oral Daily   Continuous Infusions:  lactated ringers  75 mL/hr at 03/19/24 0750     LOS: 1 day    Time spent: 35 minutes    Wyeth Hoffer A Hayden Kihara, MD [facility]   If 7PM-7AM, please contact night-coverage [email or web address]  03/19/2024, 8:10 AM

## 2024-03-20 LAB — GLUCOSE, CAPILLARY
Glucose-Capillary: 152 mg/dL — ABNORMAL HIGH (ref 70–99)
Glucose-Capillary: 131 mg/dL — ABNORMAL HIGH (ref 70–99)
Glucose-Capillary: 159 mg/dL — ABNORMAL HIGH (ref 70–99)
Glucose-Capillary: 131 mg/dL — ABNORMAL HIGH (ref 70–99)
Glucose-Capillary: 173 mg/dL — ABNORMAL HIGH (ref 70–99)

## 2024-03-20 LAB — BPAM RBC
Blood Product Expiration Date: 202507302359
Blood Product Expiration Date: 202508212359
ISSUE DATE / TIME: 202507252318
ISSUE DATE / TIME: 202507261146
Unit Type and Rh: 5100
Unit Type and Rh: 9500

## 2024-03-20 LAB — TYPE AND SCREEN
ABO/RH(D): O POS
Antibody Screen: NEGATIVE
Unit division: 0
Unit division: 0

## 2024-03-20 LAB — CBC
HCT: 26.9 % — ABNORMAL LOW (ref 36.0–46.0)
Hemoglobin: 9.2 g/dL — ABNORMAL LOW (ref 12.0–15.0)
MCH: 29.7 pg (ref 26.0–34.0)
MCHC: 34.2 g/dL (ref 30.0–36.0)
MCV: 86.8 fL (ref 80.0–100.0)
Platelets: 271 K/uL (ref 150–400)
RBC: 3.1 MIL/uL — ABNORMAL LOW (ref 3.87–5.11)
RDW: 14.8 % (ref 11.5–15.5)
WBC: 16.1 K/uL — ABNORMAL HIGH (ref 4.0–10.5)
nRBC: 0 % (ref 0.0–0.2)

## 2024-03-20 LAB — BASIC METABOLIC PANEL WITH GFR
Anion gap: 9 (ref 5–15)
BUN: 48 mg/dL — ABNORMAL HIGH (ref 6–20)
CO2: 15 mmol/L — ABNORMAL LOW (ref 22–32)
Calcium: 8.3 mg/dL — ABNORMAL LOW (ref 8.9–10.3)
Chloride: 113 mmol/L — ABNORMAL HIGH (ref 98–111)
Creatinine, Ser: 3.27 mg/dL — ABNORMAL HIGH (ref 0.44–1.00)
GFR, Estimated: 17 mL/min — ABNORMAL LOW (ref >60)
Glucose, Bld: 137 mg/dL — ABNORMAL HIGH (ref 70–99)
Potassium: 5.1 mmol/L (ref 3.5–5.1)
Sodium: 137 mmol/L (ref 135–145)

## 2024-03-20 MED ORDER — ACETAMINOPHEN 325 MG PO TABS
650.0000 mg | ORAL_TABLET | Freq: Four times a day (QID) | ORAL | Status: DC | PRN
  Administered 2024-03-20 – 2024-03-28 (×9): 650 mg via ORAL
  Filled 2024-03-20 (×10): qty 2

## 2024-03-20 MED ORDER — SODIUM ZIRCONIUM CYCLOSILICATE 10 G PO PACK
10.0000 g | PACK | Freq: Once | ORAL | Status: AC
  Administered 2024-03-20: 10 g via ORAL
  Filled 2024-03-20: qty 1

## 2024-03-20 MED ORDER — SODIUM BICARBONATE 650 MG PO TABS: 1300.0000 mg | ORAL_TABLET | Freq: Two times a day (BID) | ORAL | Status: DC

## 2024-03-20 MED ORDER — PROCHLORPERAZINE EDISYLATE 10 MG/2ML IJ SOLN
5.0000 mg | Freq: Four times a day (QID) | INTRAMUSCULAR | Status: DC | PRN
  Administered 2024-03-20 – 2024-03-29 (×4): 5 mg via INTRAVENOUS
  Filled 2024-03-20 (×4): qty 2

## 2024-03-20 MED ORDER — HYDRALAZINE HCL 25 MG PO TABS
25.0000 mg | ORAL_TABLET | Freq: Three times a day (TID) | ORAL | Status: DC
  Administered 2024-03-20: 25 mg via ORAL
  Filled 2024-03-20: qty 1

## 2024-03-20 MED ORDER — HYDRALAZINE HCL 50 MG PO TABS
50.0000 mg | ORAL_TABLET | Freq: Three times a day (TID) | ORAL | Status: DC
  Administered 2024-03-20 – 2024-03-31 (×30): 50 mg via ORAL
  Filled 2024-03-20 (×30): qty 1

## 2024-03-20 NOTE — Progress Notes (Signed)
 Utilized [employer], [employer], [place] [phone], to administer medications and address pt's needs.

## 2024-03-20 NOTE — Plan of Care (Signed)
  Problem: Coping: Goal: Ability to adjust to condition or change in health will improve Outcome: Progressing   Problem: Fluid Volume: Goal: Ability to maintain a balanced intake and output will improve Outcome: Progressing   Problem: Skin Integrity: Goal: Risk for impaired skin integrity will decrease Outcome: Progressing   Problem: Pain Managment: Goal: General experience of comfort will improve and/or be controlled Outcome: Progressing   Problem: Safety: Goal: Ability to remain free from injury will improve Outcome: Progressing

## 2024-03-20 NOTE — Progress Notes (Signed)
 Utilized [employer], [employer], [place] #[id number], to administer medications and address pt's needs. Pt says she doesn't need anything else.

## 2024-03-20 NOTE — Progress Notes (Signed)
 Utilized [employer], [employer], [place] [phone], to administer medication and address pt's needs. Per Interpreter pt does not need anything else. Instructed pt to press call bell if she needs anything.

## 2024-03-20 NOTE — Plan of Care (Signed)

## 2024-03-20 NOTE — Plan of Care (Signed)
  Problem: Coping: Goal: Ability to adjust to condition or change in health will improve 03/20/2024 1852 by Bernardo Boast, LPN Outcome: Progressing 03/20/2024 1817 by Bernardo Boast, LPN Outcome: Progressing   Problem: Fluid Volume: Goal: Ability to maintain a balanced intake and output will improve Outcome: Progressing   Problem: Skin Integrity: Goal: Risk for impaired skin integrity will decrease 03/20/2024 1852 by Bernardo Boast, LPN Outcome: Progressing 03/20/2024 1817 by Bernardo Boast, LPN Outcome: Progressing   Problem: Activity: Goal: Risk for activity intolerance will decrease Outcome: Progressing   Problem: Elimination: Goal: Will not experience complications related to bowel motility Outcome: Progressing Goal: Will not experience complications related to urinary retention Outcome: Progressing   Problem: Pain Managment: Goal: General experience of comfort will improve and/or be controlled 03/20/2024 1852 by Bernardo Boast, LPN Outcome: Progressing 03/20/2024 1817 by Bernardo Boast, LPN Outcome: Progressing   Problem: Safety: Goal: Ability to remain free from injury will improve 03/20/2024 1852 by Bernardo Boast, LPN Outcome: Progressing 03/20/2024 1817 by Bernardo Boast, LPN Outcome: Progressing

## 2024-03-20 NOTE — Progress Notes (Signed)
 PROGRESS NOTE    Michele Morales  FMW:[id number] DOB: 11/23/1975 DOA: 03/18/2024 PCP: Celestia Rosaline SQUIBB, NP   Brief Narrative: 48 year old with past medical history significant for insulin -dependent diabetes, hypertension, hyperlipidemia, diabetic neuropathy presented to the ER referred by her PCP due to abnormal lab work performed on 7/23.  She was found to have a creatinine of 2.5 and hemoglobin was also low at 6.5.  She report menorrhagia.  Patient was found to be in AKI with a creatinine of 3.4, metabolic acidosis with a bicarb of 14, potassium 5.4.  Hemoglobin 6.5.   Assessment & Plan:   Principal Problem:   Symptomatic anemia Active Problems:   Type 2 diabetes mellitus with hyperglycemia, with long-term current use of insulin  (HCC)   Essential hypertension   Hyperkalemia   Acute kidney injury superimposed on stage 3b chronic kidney disease (HCC)   1-Acute on chronic anemia, symptomatic: -In the setting of menorrhagia -Patient presented with a hemoglobin of 6.5, prior hemoglobin 9.5----11 -Continue to hold lisinopril  and hydrochlorothiazide  --Received a total of 2 units PRBC>  -B 12 314, start supplement, Folate 12, iron  169, T sat 46 -Anemia could be related to blood loss anemia and anemia of chronic renal diseases.   Menorrhagia: related to Adenomyosis.  TSH: 3.5 Pelvic US : Heterogeneous uterus with findings suggestive of adenomyosis.  Will need GYN referral. Denies currently bleeding.   Pyuria; UTI Continue with IV antibiotics.  Concern for UTI, in the setting of AKI Follow Urine culture.   AKI on CKD stage IIIb Metabolic acidosis, hyperkalemia -Patient previous creatinine range 1.4---1.6 -2 months ago creatinine of 2.04--- presented with a creatinine of 3.5 -Blood transfusion, IV fluids/bicarb gtt.   Follow trend.  -Renal US : Minimal fullness of the left collecting system likely related to the distended bladder. -Strict I and O, Bladder scan.  Didn't  received bicarb gtt overnight. Start this am.   Insulin -dependent diabetes: -CBG low side. Will hold long acting insulin .  SSI  Essential hypertension -Started  Hydralazine . Might have to increase dose if BP remain elevated today.  Hold lisinopril  and hydrochlorothiazide >    Estimated body mass index is 25.47 kg/m as calculated from the following:   Height as of this encounter: 4' 11 (1.499 m).   Weight as of this encounter: 57.2 kg.   DVT prophylaxis: SCD Code Status: Full code Family Communication: Crae discussed with patient and husband.  Disposition Plan:  Status is: Inpatient Remains inpatient appropriate because: management of Anemia, UTI, AKI     Consultants:  None  Procedures:    Antimicrobials:    Subjective: She is alert, report feeling better, dizziness improved. Mild nausea in the morning.    Objective: Vitals:   03/19/24 2300 03/20/24 0524 03/20/24 0744 03/20/24 1004  BP: (!) 149/81 (!) 161/78 (!) 185/89 (!) 185/89  Pulse:  87 97   Resp:  18 16   Temp:  98.6 F (37 C) 98 F (36.7 C)   TempSrc:  Oral Oral   SpO2:  100% 100%   Weight:      Height:        Intake/Output Summary (Last 24 hours) at 03/20/2024 1324 Last data filed at 03/20/2024 1100 Gross per 24 hour  Intake 955 ml  Output 2080 ml  Net -1125 ml   Filed Weights   03/18/24 1942  Weight: 57.2 kg    Examination:  General exam: NAD Respiratory system: CTA Cardiovascular system: S 1, S 2 RRR Gastrointestinal system: BS present, soft, nt Central nervous  system: alert Extremities: no edema   Data Reviewed: I have personally reviewed following labs and imaging studies  CBC: Recent Labs  Lab 03/16/24 0958 03/18/24 2015 03/19/24 0816 03/20/24 0709  WBC 13.5* 16.7* 14.3* 16.1*  NEUTROABS 10.6* 14.0*  --   --   HGB 7.3* 6.5* 7.8* 9.2*  HCT 23.1* 20.2* 23.1* 26.9*  MCV 92 90.2 87.2 86.8  PLT 392 349 301 271   Basic Metabolic Panel: Recent Labs  Lab 03/16/24 0958  03/18/24 2015 03/19/24 0816 03/20/24 0709  NA 135 135 134* 137  K 6.2* 5.4* 5.1 5.1  CL 110* 113* 112* 113*  CO2 14* 15* 15* 15*  GLUCOSE 143* 87 110* 137*  BUN 50* 49* 46* 48*  CREATININE 3.41* 3.52* 3.31* 3.27*  CALCIUM  8.5* 8.3* 8.0* 8.3*   GFR: Estimated Creatinine Clearance: 16.2 mL/min (A) (by C-G formula based on SCr of 3.27 mg/dL (H)). Liver Function Tests: Recent Labs  Lab 03/16/24 0958 03/18/24 2015  AST 10 13*  ALT 7 11  ALKPHOS 84 68  BILITOT <0.2 0.5  PROT 6.2 6.1*  ALBUMIN 3.8* 3.0*   No results for input(s): LIPASE, AMYLASE in the last 168 hours. No results for input(s): AMMONIA in the last 168 hours. Coagulation Profile: No results for input(s): INR, PROTIME in the last 168 hours. Cardiac Enzymes: No results for input(s): CKTOTAL, CKMB, CKMBINDEX, TROPONINI in the last 168 hours. BNP (last 3 results) No results for input(s): PROBNP in the last 8760 hours. HbA1C: Recent Labs    03/19/24 0000  HGBA1C 7.1*   CBG: Recent Labs  Lab 03/19/24 1558 03/19/24 2211 03/20/24 0825 03/20/24 0834 03/20/24 1157  GLUCAP 118* 160* 152* 131* 159*   Lipid Profile: No results for input(s): CHOL, HDL, LDLCALC, TRIG, CHOLHDL, LDLDIRECT in the last 72 hours.  Thyroid Function Tests: Recent Labs    03/19/24 0000  TSH 3.521   Anemia Panel: Recent Labs    03/18/24 2332 03/18/24 2359  VITAMINB12 314  --   FOLATE 12.9  --   TIBC  --  367  IRON   --  169   Sepsis Labs: No results for input(s): PROCALCITON, LATICACIDVEN in the last 168 hours.  No results found for this or any previous visit (from the past 240 hours).       Radiology Studies: US  PELVIS (TRANSABDOMINAL ONLY) Result Date: 03/19/2024 CLINICAL DATA:  Abnormal uterine bleeding. EXAM: TRANSABDOMINAL ULTRASOUND OF PELVIS TECHNIQUE: Transabdominal ultrasound examination of the pelvis was performed including evaluation of the uterus, ovaries, adnexal regions,  and pelvic cul-de-sac. COMPARISON:  None Available. FINDINGS: Uterus Measurements: 10.6 x 4.7 x 5.3 cm = volume: 138 mL. The uterus demonstrates a heterogeneous echotexture with findings suspicious for adenomyosis. Endometrium Thickness: 14 mm. No focal abnormality visualized. There is indistinct junctional zone. Right ovary Measurements: 2.8 x 1.6 x 2.5 cm = volume: 6.1 mL. There is a 2 cm cyst in the right ovary. Left ovary Measurements: 4.1 x 2.6 x 3.4 cm = volume: 19 mL. Normal appearance/no adnexal mass. No free fluid in the pelvis. Faint echogenic floating debris in the urinary bladder. Correlation with urinalysis recommended to exclude UTI. IMPRESSION: 1. Heterogeneous uterus with findings suggestive of adenomyosis. 2. Unremarkable ovaries. Electronically Signed   By: Vanetta Chou M.D.   On: 03/19/2024 10:20   US  RENAL Result Date: 03/19/2024 CLINICAL DATA:  Acute renal injury EXAM: RENAL / URINARY TRACT ULTRASOUND COMPLETE COMPARISON:  None Available. FINDINGS: Right Kidney: Renal measurements: 9.8 x 4.6 x  5.2 cm. = volume: 125 mL. Echogenicity within normal limits. No mass or hydronephrosis visualized. Left Kidney: Renal measurements: 10.9 x 5.9 x 5.8 cm. = volume: 194 mL. Minimal fullness of the left collecting system is noted. This persists following voiding although significant decompression of the bladder was not seen. Bladder: Appears normal for degree of bladder distention. Other: None. IMPRESSION: Minimal fullness of the left collecting system likely related to the distended bladder. Electronically Signed   By: Oneil Devonshire M.D.   On: 03/19/2024 01:44        Scheduled Meds:  cyanocobalamin   1,000 mcg Intramuscular Daily   hydrALAZINE   25 mg Oral Q8H   insulin  aspart  0-15 Units Subcutaneous TID WC   insulin  aspart  0-5 Units Subcutaneous QHS   rosuvastatin   10 mg Oral Daily   Continuous Infusions:  cefTRIAXone  (ROCEPHIN )  IV Stopped (03/19/24 1508)   sodium bicarbonate  75 mEq  in sodium chloride  0.45 % 1,075 mL infusion 100 mL/hr at 03/20/24 1018     LOS: 2 days    Time spent: 35 minutes    Damari Hiltz A Idus Rathke, MD [facility]   If 7PM-7AM, please contact night-coverage [email or web address]  03/20/2024, 1:24 PM

## 2024-03-20 NOTE — Progress Notes (Signed)
 Utilized [employer], [place] Interpreter, [place] # [phone], to administer medications and assessment if pt is in pain. Notified Dr. Owen Lore that pt is complaining of headache and request for tylenol  prn. MD placed order for tylenol  prn.

## 2024-03-20 NOTE — Progress Notes (Signed)
 Utilized [employer], [place] Interpreter, Eric #[phone] to perform assessment, administer medications, ordered breakfast and attend all pt's needs. Per Interpreter pt is comfortable and does not need anything else.

## 2024-03-21 ENCOUNTER — Inpatient Hospital Stay (HOSPITAL_COMMUNITY): Payer: MEDICAID

## 2024-03-21 LAB — CBC
HCT: 24.8 % — ABNORMAL LOW (ref 36.0–46.0)
Hemoglobin: 8.6 g/dL — ABNORMAL LOW (ref 12.0–15.0)
MCH: 29.9 pg (ref 26.0–34.0)
MCHC: 34.7 g/dL (ref 30.0–36.0)
MCV: 86.1 fL (ref 80.0–100.0)
Platelets: 244 K/uL (ref 150–400)
RBC: 2.88 MIL/uL — ABNORMAL LOW (ref 3.87–5.11)
RDW: 14.6 % (ref 11.5–15.5)
WBC: 14.5 K/uL — ABNORMAL HIGH (ref 4.0–10.5)
nRBC: 0 % (ref 0.0–0.2)

## 2024-03-21 LAB — GLUCOSE, CAPILLARY
Glucose-Capillary: 130 mg/dL — ABNORMAL HIGH (ref 70–99)
Glucose-Capillary: 183 mg/dL — ABNORMAL HIGH (ref 70–99)
Glucose-Capillary: 150 mg/dL — ABNORMAL HIGH (ref 70–99)
Glucose-Capillary: 173 mg/dL — ABNORMAL HIGH (ref 70–99)

## 2024-03-21 LAB — BASIC METABOLIC PANEL WITH GFR
Anion gap: 14 (ref 5–15)
BUN: 47 mg/dL — ABNORMAL HIGH (ref 6–20)
CO2: 18 mmol/L — ABNORMAL LOW (ref 22–32)
Calcium: 7.9 mg/dL — ABNORMAL LOW (ref 8.9–10.3)
Chloride: 103 mmol/L (ref 98–111)
Creatinine, Ser: 3.85 mg/dL — ABNORMAL HIGH (ref 0.44–1.00)
GFR, Estimated: 14 mL/min — ABNORMAL LOW (ref >60)
Glucose, Bld: 140 mg/dL — ABNORMAL HIGH (ref 70–99)
Potassium: 4.4 mmol/L (ref 3.5–5.1)
Sodium: 135 mmol/L (ref 135–145)

## 2024-03-21 LAB — PREGNANCY, URINE: Preg Test, Ur: NEGATIVE

## 2024-03-21 LAB — HEPATITIS B SURFACE ANTIGEN: Hepatitis B Surface Ag: NONREACTIVE

## 2024-03-21 MED ORDER — POLYETHYLENE GLYCOL 3350 17 G PO PACK
17.0000 g | PACK | Freq: Every day | ORAL | Status: DC
  Filled 2024-03-21: qty 1

## 2024-03-21 MED ORDER — MEGESTROL ACETATE 40 MG PO TABS
40.0000 mg | ORAL_TABLET | Freq: Every day | ORAL | Status: DC
  Administered 2024-03-22 – 2024-03-31 (×6): 40 mg via ORAL
  Filled 2024-03-21 (×11): qty 1

## 2024-03-21 MED ORDER — SODIUM CHLORIDE 0.9 % IV BOLUS
500.0000 mL | Freq: Once | INTRAVENOUS | Status: AC
  Administered 2024-03-21: 500 mL via INTRAVENOUS

## 2024-03-21 MED ORDER — VITAMIN B-12 100 MCG PO TABS
500.0000 ug | ORAL_TABLET | Freq: Every day | ORAL | Status: DC
  Administered 2024-03-25 – 2024-03-31 (×6): 500 ug via ORAL
  Filled 2024-03-21 (×8): qty 5

## 2024-03-21 MED ORDER — HYDROXYZINE HCL 25 MG PO TABS
25.0000 mg | ORAL_TABLET | Freq: Once | ORAL | Status: AC
  Administered 2024-03-21: 25 mg via ORAL
  Filled 2024-03-21: qty 1

## 2024-03-21 MED ORDER — PANTOPRAZOLE SODIUM 40 MG IV SOLR
40.0000 mg | Freq: Two times a day (BID) | INTRAVENOUS | Status: DC
  Administered 2024-03-21 – 2024-03-29 (×17): 40 mg via INTRAVENOUS
  Filled 2024-03-21 (×18): qty 10

## 2024-03-21 MED ORDER — GLYCERIN (LAXATIVE) 2 G RE SUPP
1.0000 | Freq: Once | RECTAL | Status: AC
  Administered 2024-03-21: 1 via RECTAL
  Filled 2024-03-21: qty 1

## 2024-03-21 MED ORDER — CHLORHEXIDINE GLUCONATE CLOTH 2 % EX PADS
6.0000 | MEDICATED_PAD | Freq: Every day | CUTANEOUS | Status: DC
  Administered 2024-03-24 – 2024-03-31 (×8): 6 via TOPICAL

## 2024-03-21 NOTE — Plan of Care (Signed)
  Problem: Coping: Goal: Ability to adjust to condition or change in health will improve Outcome: Progressing   Problem: Activity: Goal: Risk for activity intolerance will decrease Outcome: Progressing   Problem: Pain Managment: Goal: General experience of comfort will improve and/or be controlled Outcome: Progressing   Problem: Safety: Goal: Ability to remain free from injury will improve Outcome: Progressing

## 2024-03-21 NOTE — Progress Notes (Addendum)
 PROGRESS NOTE    Michele Morales  FMW:[id number] DOB: [birth year] DOA: 03/18/2024 PCP: Celestia Rosaline SQUIBB, NP   Brief Narrative: 48 year old with past medical history significant for insulin -dependent diabetes, hypertension, hyperlipidemia, diabetic neuropathy presented to the ER referred by her PCP due to abnormal lab work performed on 7/23.  She was found to have a creatinine of 2.5 and hemoglobin was also low at 6.5.  She report menorrhagia.  Patient was found to be in AKI with a creatinine of 3.4, metabolic acidosis with a bicarb of 14, potassium 5.4.  Hemoglobin 6.5.   Assessment & Plan:   Principal Problem:   Symptomatic anemia Active Problems:   Type 2 diabetes mellitus with hyperglycemia, with long-term current use of insulin  (HCC)   Essential hypertension   Hyperkalemia   Acute kidney injury superimposed on stage 3b chronic kidney disease (HCC)   1-Acute on chronic anemia, symptomatic: -In the setting of menorrhagia -Patient presented with a hemoglobin of 6.5, prior hemoglobin 9.5----11 -Continue to hold lisinopril  and hydrochlorothiazide  --Received a total of 2 units PRBC>  -B 12 314, start supplement, Folate 12, iron  169, T sat 46 -Anemia could be related to blood loss anemia and anemia of chronic renal diseases.  Monitor hb.   Menorrhagia: related to Adenomyosis.  TSH: 3.5 Pelvic US : Heterogeneous uterus with findings suggestive of adenomyosis.  Discussed with Dr Barbra, plan to start patient on Megace  40 mg daily, they  will help facilitate out patient follow up. Will check pregnancy test as well.  No active bleeding.   Pyuria; UTI Continue with IV antibiotics.  Concern for UTI, in the setting of AKI Follow Urine culture.   AKI on CKD stage IIIb Metabolic acidosis, hyperkalemia -Patient previous creatinine range 1.4---1.6 -2 months ago creatinine of 2.04--- presented with a creatinine of 3.5 -Renal US : Minimal fullness of the left collecting system likely  related to the distended bladder. Continue with Bicarb gtt. Vomited overnight.  Renal function worse today, will involve nephrology. She is vomiting.  IV Bolus.   Insulin -dependent diabetes: -CBG low side, SSI  Constipation:  KUB negative for obstruction. Will give glycerin  enema.  Start Miralax   Essential hypertension -Started  Hydralazine . Increased to 50 mg TID yesterday.  Hold lisinopril  and hydrochlorothiazide >    Estimated body mass index is 25.47 kg/m as calculated from the following:   Height as of this encounter: 4' 11 (1.499 m).   Weight as of this encounter: 57.2 kg.   DVT prophylaxis: SCD Code Status: Full code Family Communication: Michele Morales discussed with patient and husband.  Disposition Plan:  Status is: Inpatient Remains inpatient appropriate because: management of Anemia, UTI, AKI     Consultants:  None  Procedures:    Antimicrobials:    Subjective: She has been vomiting overnight. No BM since last week. She has problems with chronic constipation/  Objective: Vitals:   03/21/24 0230 03/21/24 0600 03/21/24 0751 03/21/24 1329  BP: 118/67 132/67 (!) 149/79 (!) 149/79  Pulse: (!) 106 98 (!) 103   Resp: 18 18 17    Temp:  98.6 F (37 C) 98.6 F (37 C)   TempSrc:  Oral Oral   SpO2:  97% 98%   Weight:      Height:        Intake/Output Summary (Last 24 hours) at 03/21/2024 1357 Last data filed at 03/21/2024 1310 Gross per 24 hour  Intake 2811.2 ml  Output 802 ml  Net 2009.2 ml   Filed Weights   03/18/24 1942  Weight:  57.2 kg    Examination:  General exam: NAD Respiratory system: CTA Cardiovascular system: S 1, S 2 RRR Gastrointestinal system: BS present, soft, nt Central nervous system: alert Extremities: no edema   Data Reviewed: I have personally reviewed following labs and imaging studies  CBC: Recent Labs  Lab 03/16/24 0958 03/18/24 2015 03/19/24 0816 03/20/24 0709 03/21/24 0740  WBC 13.5* 16.7* 14.3* 16.1* 14.5*   NEUTROABS 10.6* 14.0*  --   --   --   HGB 7.3* 6.5* 7.8* 9.2* 8.6*  HCT 23.1* 20.2* 23.1* 26.9* 24.8*  MCV 92 90.2 87.2 86.8 86.1  PLT 392 349 301 271 244   Basic Metabolic Panel: Recent Labs  Lab 03/16/24 0958 03/18/24 2015 03/19/24 0816 03/20/24 0709 03/21/24 0740  NA 135 135 134* 137 135  K 6.2* 5.4* 5.1 5.1 4.4  CL 110* 113* 112* 113* 103  CO2 14* 15* 15* 15* 18*  GLUCOSE 143* 87 110* 137* 140*  BUN 50* 49* 46* 48* 47*  CREATININE 3.41* 3.52* 3.31* 3.27* 3.85*  CALCIUM  8.5* 8.3* 8.0* 8.3* 7.9*   GFR: Estimated Creatinine Clearance: 13.8 mL/min (A) (by C-G formula based on SCr of 3.85 mg/dL (H)). Liver Function Tests: Recent Labs  Lab 03/16/24 0958 03/18/24 2015  AST 10 13*  ALT 7 11  ALKPHOS 84 68  BILITOT <0.2 0.5  PROT 6.2 6.1*  ALBUMIN 3.8* 3.0*   No results for input(s): LIPASE, AMYLASE in the last 168 hours. No results for input(s): AMMONIA in the last 168 hours. Coagulation Profile: No results for input(s): INR, PROTIME in the last 168 hours. Cardiac Enzymes: No results for input(s): CKTOTAL, CKMB, CKMBINDEX, TROPONINI in the last 168 hours. BNP (last 3 results) No results for input(s): PROBNP in the last 8760 hours. HbA1C: Recent Labs    03/19/24 0000  HGBA1C 7.1*   CBG: Recent Labs  Lab 03/20/24 1157 03/20/24 1631 03/20/24 2224 03/21/24 0753 03/21/24 1201  GLUCAP 159* 131* 173* 130* 183*   Lipid Profile: No results for input(s): CHOL, HDL, LDLCALC, TRIG, CHOLHDL, LDLDIRECT in the last 72 hours.  Thyroid Function Tests: Recent Labs    03/19/24 0000  TSH 3.521   Anemia Panel: Recent Labs    03/18/24 2332 03/18/24 2359  VITAMINB12 314  --   FOLATE 12.9  --   TIBC  --  367  IRON   --  169   Sepsis Labs: No results for input(s): PROCALCITON, LATICACIDVEN in the last 168 hours.  No results found for this or any previous visit (from the past 240 hours).       Radiology Studies: DG Abd 1  View Result Date: 03/21/2024 CLINICAL DATA:  Nausea and vomiting. EXAM: ABDOMEN - 1 VIEW COMPARISON:  None Available. FINDINGS: The bowel gas pattern is normal. A large stool burden is seen throughout the colon. No radio-opaque calculi or other significant radiographic abnormality are seen. IMPRESSION: Negative. Electronically Signed   By: Suzen Dials M.D.   On: 03/21/2024 13:24        Scheduled Meds:  hydrALAZINE   50 mg Oral Q8H   insulin  aspart  0-15 Units Subcutaneous TID WC   insulin  aspart  0-5 Units Subcutaneous QHS   pantoprazole  (PROTONIX ) IV  40 mg Intravenous Q12H   polyethylene glycol  17 g Oral Daily   rosuvastatin   10 mg Oral Daily   [START ON 03/22/2024] vitamin B-12  500 mcg Oral Daily   Continuous Infusions:  cefTRIAXone  (ROCEPHIN )  IV Stopped (03/20/24 1541)  sodium bicarbonate  75 mEq in sodium chloride  0.45 % 1,075 mL infusion 100 mL/hr at 03/21/24 1314     LOS: 3 days    Time spent: 35 minutes    Kamia Insalaco A Ramani Riva, MD [facility]   If 7PM-7AM, please contact night-coverage [email or web address]  03/21/2024, 1:57 PM

## 2024-03-21 NOTE — Plan of Care (Signed)

## 2024-03-21 NOTE — Progress Notes (Signed)
 Utilized [employer], [employer] Interpreter, [place] [phone] to administer medication and assist with pt's needs.

## 2024-03-21 NOTE — Progress Notes (Signed)
 Utilized [employer], [employer] Interpreter, [place] [phone], administer medication, obtain informed consent for hemodialysis and address pt's needs.

## 2024-03-21 NOTE — Progress Notes (Addendum)
   03/21/24 1330  TOC Brief Assessment  Insurance and Status  (referral to financial Counselor)  Home environment has been reviewed independent with husband  Prior level of function: independent  Prior/Current Home Services No current home services  Social Drivers of Health Review SDOH reviewed no interventions necessary  Readmission risk has been reviewed Yes  Transition of care needs  (see note)   Referral to financial Counselor. Pharmacy changed to [facility]. [facility] will call with cost of medications at discharge   Account has been assigned to [facility]   Thea Pouch with [employer] following patient. Updated Thea Pouch 03/22/24 regarding hemodialysis

## 2024-03-21 NOTE — Progress Notes (Signed)
 Utilized [employer], [employer], [place] [phone] to administer medication and assessment.

## 2024-03-21 NOTE — Consult Note (Addendum)
 [facility] Renal Consultation Note  Indication for Consultation: Renal failure  Assessment/Plan:  Acute Renal failure  Prior CKD stage IIIb, A3  Presented with Scr 3.52, baseline ~2.04 noted 08/2023. Last urine ACR 2,424 checked on 05/2023. Etiology likely d/t long term uncontrolled diabetes and HTN with progressively worsening renal function in the last 6 months. She also notes ongoing daily nausea/vomiting with decrease PO intake for 2 months already, decrease urine frequency. US  renal noted for minimal fullness of L collecting system related to distended bladder, no obstruction or abscess noted. I am concerned with rapid progression of kidney disease, as Scr 0.72 noted 2/24, then Scr 1.62 10/24; certainly possible this is secondary to diabetic nephropathy but it is fairly rapid progression. Concerned about uremia with underlying renal failure, will request VIR TDC placement to initiate HD.   Would like to first dialyze again we can follow-up with a renal biopsy on Wednesday vs Thursday for a definitive diagnosis as well as prognosis.  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  Of note urine pregnancy test has not been collected yet.  - Scr 3.85 (3.27) worsening today  - Total UOP 2.8L since admission, net +1.2 L. - Strict Is and Os  - Renally dose medications  UTI  Leukocytosis  UA noted for proteinuria, large leukocytes and rare bacteria. Urine culture +E. Coli. Management per primary   AoC Anemia Anemia chronic kidney disease  Hx of Menorrhagia  Presented with Hgb 6.5, baseline Hgb 9.5-11. Reported menorrhagia. US  pelvis notable for adenomyosis. S/p 2 pRBC.  - Trend CBC daily  Hypertension Hold home lisinopril  20 mg and hydrochlorothiazide  25 mg.  - Manage by primary   HPI:  Michele Morales is a 48 y.o. female with past medical history of insulin -dependent T2DM w/ hyperglycemia, hypertension, hyperlipidemia, diabetic neuropathy  presenting to ED 7/25 after referred by her PCP due to abnormal lab work noting Scr 2.5 and Hgb 6.5 checked on 7/23. In the ED, labs were pertinent for Hgb 6.5, WBC 16.7, K 5.4, bicarb 15, Scr 3.52, BUN 49. FOBT negative. UA noted for proteinuria, large leukocytes and rare bacteria. Urine culture +E. Coli.   History taken with assistance of video interpretor. Patient reports symptoms of emesis daily in the AM for the past 2 months with daily nausea. Reports decrease PO intake and hydration. No diarrhea or abdominal pain. Reports feeling dizzy and lightheaded, feeling cold and weakness at home. No hematochezia or melena. No hemoptysis. Reports hx of menorrhagia lasting 6 days, last cycle 2 weeks ago. Reports decrease in urine frequency and dysuria. Reports bilateral flank pain during vomiting episodes. Reports chills, no fevers. At home reports that she has been taking her BP medications daily (Lisinopril  20 and hydrochlorothiazide  25).   Today on exam, reports 5 episodes of emesis today, NBNB. Reports decrease PO intake d/t nausea and bilateral flank tenderness.    Creat  Date/Time Value Ref Range Status  01/31/2021 02:54 PM 0.51 0.50 - 1.10 mg/dL Final   Creatinine, Ser  Date/Time Value Ref Range Status  03/21/2024 07:40 AM 3.85 (H) 0.44 - 1.00 mg/dL Final  92/72/7974 92:90 AM 3.27 (H) 0.44 - 1.00 mg/dL Final  92/73/7974 91:83 AM 3.31 (H) 0.44 - 1.00 mg/dL Final  92/74/7974 91:84 PM 3.52 (H) 0.44 - 1.00 mg/dL Final  92/76/7974 90:41 AM 3.41 (H) 0.57 - 1.00 mg/dL Final  95/76/7974 89:92 AM 2.11 (H) 0.57 - 1.00 mg/dL Final  98/92/7974 90:74 AM 2.04 (H) 0.57 - 1.00  mg/dL Final  88/78/7975 90:50 AM 1.49 (H) 0.57 - 1.00 mg/dL Final  89/76/7975 96:40 PM 1.62 (H) 0.57 - 1.00 mg/dL Final  97/78/7975 88:90 AM 0.72 0.44 - 1.00 mg/dL Final  90/81/7976 97:54 PM 0.85 0.57 - 1.00 mg/dL Final  94/68/7977 92:48 AM 0.50 0.44 - 1.00 mg/dL Final  94/70/7977 88:66 PM 0.63 0.44 - 1.00 mg/dL Final  94/70/7977  95:95 PM 0.69 0.44 - 1.00 mg/dL Final  98/83/7980 94:80 PM 0.46 0.44 - 1.00 mg/dL Final  90/86/7981 88:83 AM 0.42 (L) 0.57 - 1.00 mg/dL Final  92/69/7981 87:52 AM 0.47 0.44 - 1.00 mg/dL Final  91/88/7982 91:58 PM 0.51 0.44 - 1.00 mg/dL Final  87/84/7990 90:99 PM 0.37 (L) 0.40 - 1.20 mg/dL Final     PMHx:   Past Medical History:  Diagnosis Date   Diabetes mellitus without complication (HCC)    IDDM   Hypercholesteremia    Hypertension    Neuromuscular disorder (HCC)    diabetic neuropathy feet    Past Surgical History:  Procedure Laterality Date   hand procedure Right    right wrist - used sedation to reset in the ER Dept   ORIF WRIST FRACTURE Right 10/15/2022   Procedure: OPEN REDUCTION INTERNAL FIXATION (ORIF) WRIST FRACTURE;  Surgeon: Shari Easter, MD;  Location: [facility] OR;  Service: Orthopedics;  Laterality: Right;  regional with iv sedation    Family Hx:  Family History  Problem Relation Age of Onset   Diabetes Mother     Social History:  reports that she has never smoked. She has never used smokeless tobacco. She reports that she does not drink alcohol and does not use drugs.  Allergies:  Allergies  Allergen Reactions   Amlodipine  Swelling    BLE edema    Medications: Prior to Admission medications   Medication Sig Start Date End Date Taking? Authorizing Provider  Dulaglutide  (TRULICITY ) 1.5 MG/0.5ML SOAJ Inject 1.5 mg into the skin once a week. 03/16/24  Yes Celestia Rosaline SQUIBB, NP  hydrochlorothiazide  (HYDRODIURIL ) 25 MG tablet Take 1 tablet (25 mg total) by mouth daily. 03/16/24  Yes Celestia Rosaline SQUIBB, NP  Insulin  Glargine (BASAGLAR  KWIKPEN) 100 UNIT/ML Inject 26 Units into the skin daily. Patient taking differently: Inject 20 Units into the skin daily. 03/16/24  Yes Celestia Rosaline SQUIBB, NP  lisinopril  (ZESTRIL ) 20 MG tablet Take 1 tablet (20 mg total) by mouth daily. 03/16/24  Yes Celestia Rosaline SQUIBB, NP  rosuvastatin  (CRESTOR ) 10 MG tablet Take 1 tablet (10 mg  total) by mouth daily. 06/17/23  Yes Mayers, Cari S, PA-C  glucose blood (TRUE METRIX BLOOD GLUCOSE TEST) test strip Use to check blood sugar three times daily. 07/16/23   Newlin, Enobong, MD  Insulin  Pen Needle (PENTIPS) 32G X 4 MM MISC use as directed 06/17/23   Mayers, Cari S, PA-C  TRUEplus Lancets 28G MISC Use to check blood sugar three times daily. 07/16/23   Newlin, Enobong, MD    I have reviewed the patient's current medications. Scheduled:  [START ON 03/22/2024] Chlorhexidine  Gluconate Cloth  6 each Topical Q0600   Glycerin  (Adult)  1 suppository Rectal Once   hydrALAZINE   50 mg Oral Q8H   insulin  aspart  0-15 Units Subcutaneous TID WC   insulin  aspart  0-5 Units Subcutaneous QHS   megestrol   40 mg Oral Daily   pantoprazole  (PROTONIX ) IV  40 mg Intravenous Q12H   polyethylene glycol  17 g Oral Daily   rosuvastatin   10 mg Oral Daily   [  START ON 03/22/2024] vitamin B-12  500 mcg Oral Daily   Continuous:  cefTRIAXone  (ROCEPHIN )  IV 200 mL/hr at 03/21/24 1653   sodium bicarbonate  75 mEq in sodium chloride  0.45 % 1,075 mL infusion 100 mL/hr at 03/21/24 1653   PRN:acetaminophen , labetalol , ondansetron  **OR** ondansetron  (ZOFRAN ) IV, prochlorperazine   Labs:  Results for orders placed or performed during the hospital encounter of 03/18/24 (from the past 48 hours)  Glucose, capillary     Status: Abnormal   Collection Time: 03/19/24 10:11 PM  Result Value Ref Range   Glucose-Capillary 160 (H) 70 - 99 mg/dL    Comment: Glucose reference range applies only to samples taken after fasting for at least 8 hours.  CBC     Status: Abnormal   Collection Time: 03/20/24  7:09 AM  Result Value Ref Range   WBC 16.1 (H) 4.0 - 10.5 K/uL   RBC 3.10 (L) 3.87 - 5.11 MIL/uL   Hemoglobin 9.2 (L) 12.0 - 15.0 g/dL   HCT 73.0 (L) 63.9 - 53.9 %   MCV 86.8 80.0 - 100.0 fL   MCH 29.7 26.0 - 34.0 pg   MCHC 34.2 30.0 - 36.0 g/dL   RDW 85.1 88.4 - 84.4 %   Platelets 271 150 - 400 K/uL   nRBC 0.0 0.0 - 0.2  %    Comment: Performed at [facility], [address], [place], [place] [address]  Basic metabolic panel     Status: Abnormal   Collection Time: 03/20/24  7:09 AM  Result Value Ref Range   Sodium 137 135 - 145 mmol/L   Potassium 5.1 3.5 - 5.1 mmol/L   Chloride 113 (H) 98 - 111 mmol/L   CO2 15 (L) 22 - 32 mmol/L   Glucose, Bld 137 (H) 70 - 99 mg/dL    Comment: Glucose reference range applies only to samples taken after fasting for at least 8 hours.   BUN 48 (H) 6 - 20 mg/dL   Creatinine, Ser 6.72 (H) 0.44 - 1.00 mg/dL   Calcium  8.3 (L) 8.9 - 10.3 mg/dL   GFR, Estimated 17 (L) >60 mL/min    Comment: (NOTE) Calculated using the CKD-EPI Creatinine Equation (2021)    Anion gap 9 5 - 15    Comment: Performed at [facility], [address] [place], [place] [address]  Glucose, capillary     Status: Abnormal   Collection Time: 03/20/24  8:25 AM  Result Value Ref Range   Glucose-Capillary 152 (H) 70 - 99 mg/dL    Comment: Glucose reference range applies only to samples taken after fasting for at least 8 hours.   Comment 1 Notify RN   Glucose, capillary     Status: Abnormal   Collection Time: 03/20/24  8:34 AM  Result Value Ref Range   Glucose-Capillary 131 (H) 70 - 99 mg/dL    Comment: Glucose reference range applies only to samples taken after fasting for at least 8 hours.   Comment 1 Notify RN   Glucose, capillary     Status: Abnormal   Collection Time: 03/20/24 11:57 AM  Result Value Ref Range   Glucose-Capillary 159 (H) 70 - 99 mg/dL    Comment: Glucose reference range applies only to samples taken after fasting for at least 8 hours.   Comment 1 Notify RN   Glucose, capillary     Status: Abnormal   Collection Time: 03/20/24  4:31 PM  Result Value Ref Range   Glucose-Capillary 131 (  H) 70 - 99 mg/dL    Comment: Glucose reference range applies only to samples taken after fasting for at least 8 hours.  Glucose, capillary     Status: Abnormal   Collection Time:  03/20/24 10:24 PM  Result Value Ref Range   Glucose-Capillary 173 (H) 70 - 99 mg/dL    Comment: Glucose reference range applies only to samples taken after fasting for at least 8 hours.  CBC     Status: Abnormal   Collection Time: 03/21/24  7:40 AM  Result Value Ref Range   WBC 14.5 (H) 4.0 - 10.5 K/uL   RBC 2.88 (L) 3.87 - 5.11 MIL/uL   Hemoglobin 8.6 (L) 12.0 - 15.0 g/dL   HCT 75.1 (L) 63.9 - 53.9 %   MCV 86.1 80.0 - 100.0 fL   MCH 29.9 26.0 - 34.0 pg   MCHC 34.7 30.0 - 36.0 g/dL   RDW 85.3 88.4 - 84.4 %   Platelets 244 150 - 400 K/uL   nRBC 0.0 0.0 - 0.2 %    Comment: Performed at [facility], [address] [place], [place] [address]  Basic metabolic panel with GFR     Status: Abnormal   Collection Time: 03/21/24  7:40 AM  Result Value Ref Range   Sodium 135 135 - 145 mmol/L   Potassium 4.4 3.5 - 5.1 mmol/L   Chloride 103 98 - 111 mmol/L   CO2 18 (L) 22 - 32 mmol/L   Glucose, Bld 140 (H) 70 - 99 mg/dL    Comment: Glucose reference range applies only to samples taken after fasting for at least 8 hours.   BUN 47 (H) 6 - 20 mg/dL   Creatinine, Ser 6.14 (H) 0.44 - 1.00 mg/dL   Calcium  7.9 (L) 8.9 - 10.3 mg/dL   GFR, Estimated 14 (L) >60 mL/min    Comment: (NOTE) Calculated using the CKD-EPI Creatinine Equation (2021)    Anion gap 14 5 - 15    Comment: Performed at [facility], [address], [place], [place] [address]  Glucose, capillary     Status: Abnormal   Collection Time: 03/21/24  7:53 AM  Result Value Ref Range   Glucose-Capillary 130 (H) 70 - 99 mg/dL    Comment: Glucose reference range applies only to samples taken after fasting for at least 8 hours.  Glucose, capillary     Status: Abnormal   Collection Time: 03/21/24 12:01 PM  Result Value Ref Range   Glucose-Capillary 183 (H) 70 - 99 mg/dL    Comment: Glucose reference range applies only to samples taken after fasting for at least 8 hours.  Glucose, capillary     Status: Abnormal   Collection  Time: 03/21/24  4:29 PM  Result Value Ref Range   Glucose-Capillary 150 (H) 70 - 99 mg/dL    Comment: Glucose reference range applies only to samples taken after fasting for at least 8 hours.     ROS: +NBNB emesis, nausea, bilateral flank pain   Physical Exam: Vitals:   03/21/24 1329 03/21/24 1628  BP: (!) 149/79 (!) 156/85  Pulse:  (!) 106  Resp:  17  Temp:  98.4 F (36.9 C)  SpO2:  98%     General: Appears ill, hunched over, sitting in chair  Heart: Tachycardiac  Lungs: Breathing comfortably  Abdomen: soft, NT, ND.  Extremities: warm, no LE edema bilaterally  Skin: Warn to touch  Neuro: Awake, alert, answering questions appropriately  Toma Edwards PGY 2  03/21/2024, 5:41 PM

## 2024-03-21 NOTE — Progress Notes (Signed)
 Utilized [employer]or[employer] Piety #[id number] to administer medication and assist pt's needs. Pt says she is OK and doesn't need anything else per interpreter.

## 2024-03-22 ENCOUNTER — Inpatient Hospital Stay (HOSPITAL_COMMUNITY): Payer: MEDICAID

## 2024-03-22 HISTORY — PX: IR US GUIDE VASC ACCESS RIGHT: IMG2390

## 2024-03-22 HISTORY — PX: IR FLUORO GUIDE CV LINE RIGHT: IMG2283

## 2024-03-22 LAB — BASIC METABOLIC PANEL WITH GFR
Anion gap: 12 (ref 5–15)
BUN: 44 mg/dL — ABNORMAL HIGH (ref 6–20)
CO2: 21 mmol/L — ABNORMAL LOW (ref 22–32)
Calcium: 8 mg/dL — ABNORMAL LOW (ref 8.9–10.3)
Chloride: 98 mmol/L (ref 98–111)
Creatinine, Ser: 3.56 mg/dL — ABNORMAL HIGH (ref 0.44–1.00)
GFR, Estimated: 15 mL/min — ABNORMAL LOW (ref >60)
Glucose, Bld: 180 mg/dL — ABNORMAL HIGH (ref 70–99)
Potassium: 3.8 mmol/L (ref 3.5–5.1)
Sodium: 131 mmol/L — ABNORMAL LOW (ref 135–145)

## 2024-03-22 LAB — URINE CULTURE: Culture: >=100000 — AB

## 2024-03-22 LAB — HEPATITIS C ANTIBODY: HCV Ab: NONREACTIVE

## 2024-03-22 LAB — CBC
HCT: 26.3 % — ABNORMAL LOW (ref 36.0–46.0)
Hemoglobin: 9 g/dL — ABNORMAL LOW (ref 12.0–15.0)
MCH: 29.7 pg (ref 26.0–34.0)
MCHC: 34.2 g/dL (ref 30.0–36.0)
MCV: 86.8 fL (ref 80.0–100.0)
Platelets: 266 K/uL (ref 150–400)
RBC: 3.03 MIL/uL — ABNORMAL LOW (ref 3.87–5.11)
RDW: 14.3 % (ref 11.5–15.5)
WBC: 15.2 K/uL — ABNORMAL HIGH (ref 4.0–10.5)
nRBC: 0 % (ref 0.0–0.2)

## 2024-03-22 LAB — GLUCOSE, CAPILLARY
Glucose-Capillary: 172 mg/dL — ABNORMAL HIGH (ref 70–99)
Glucose-Capillary: 209 mg/dL — ABNORMAL HIGH (ref 70–99)
Glucose-Capillary: 125 mg/dL — ABNORMAL HIGH (ref 70–99)
Glucose-Capillary: 129 mg/dL — ABNORMAL HIGH (ref 70–99)
Glucose-Capillary: 161 mg/dL — ABNORMAL HIGH (ref 70–99)

## 2024-03-22 MED ORDER — SENNA 8.6 MG PO TABS
1.0000 | ORAL_TABLET | Freq: Every day | ORAL | Status: DC
  Administered 2024-03-22 – 2024-03-29 (×6): 8.6 mg via ORAL
  Filled 2024-03-22 (×8): qty 1

## 2024-03-22 MED ORDER — LIDOCAINE-EPINEPHRINE 1 %-1:100000 IJ SOLN
20.0000 mL | Freq: Once | INTRAMUSCULAR | Status: AC
  Administered 2024-03-22: 10 mL
  Filled 2024-03-22: qty 20

## 2024-03-22 MED ORDER — HEPARIN SODIUM (PORCINE) 1000 UNIT/ML IJ SOLN
10000.0000 [IU] | Freq: Once | INTRAMUSCULAR | Status: AC
  Administered 2024-03-22: 2600 [IU]

## 2024-03-22 MED ORDER — LIDOCAINE-EPINEPHRINE 1 %-1:100000 IJ SOLN
INTRAMUSCULAR | Status: AC
  Filled 2024-03-22: qty 1

## 2024-03-22 MED ORDER — POLYETHYLENE GLYCOL 3350 17 G PO PACK
17.0000 g | PACK | Freq: Two times a day (BID) | ORAL | Status: DC
  Administered 2024-03-22 – 2024-03-29 (×12): 17 g via ORAL
  Filled 2024-03-22 (×15): qty 1

## 2024-03-22 MED ORDER — HEPARIN SODIUM (PORCINE) 1000 UNIT/ML IJ SOLN
INTRAMUSCULAR | Status: AC
  Filled 2024-03-22: qty 10

## 2024-03-22 NOTE — Progress Notes (Signed)
 Pt return from IR. Place IV fluids, call button in reach.

## 2024-03-22 NOTE — Progress Notes (Addendum)
 Correction for IR procedure today (7/29):  After speaking with Dr. Melia of nephrology, given leukocytosis and known UTI, plan for temporary hemodialysis catheter placement at this time. Will re-evaluate for temporary to tunneled HD cath conversion in the future.  Patient updated regarding change of procedure to temporary catheter, voices understanding and agreement. Risks and benefits re-discussed with the patient including, but not limited to bleeding, infection, vascular injury, pneumothorax which may require chest tube placement, air embolism or even death. All of the patient's questions were answered, patient is agreeable to proceed. Consent signed and in chart.   Kimble DEL Mclain Freer PA-C 03/22/2024 9:44 AM

## 2024-03-22 NOTE — Procedures (Signed)
 Interventional Radiology Procedure Note  Procedure: RT internal jugular TEMP HD CATH    Complications: None  Estimated Blood Loss:  MIN  Findings: TIP SVCRA    EMERSON FREDERIC SPECKING, MD

## 2024-03-22 NOTE — Progress Notes (Signed)
 PROGRESS NOTE    Michele Morales  FMW:[id number] DOB: 12-19-1975 DOA: 03/18/2024 PCP: Celestia Rosaline SQUIBB, NP   Brief Narrative: 48 year old with past medical history significant for insulin -dependent diabetes, hypertension, hyperlipidemia, diabetic neuropathy presented to the ER referred by her PCP due to abnormal lab work performed on 7/23.  She was found to have a creatinine of 2.5 and hemoglobin was also low at 6.5.  She report menorrhagia.  Patient was found to be in AKI with a creatinine of 3.4, metabolic acidosis with a bicarb of 14, potassium 5.4.  Hemoglobin 6.5.  Patient continued to have uremic symptoms, nephrology consulted and recommending initiation of hemodialysis.  Patient underwent internal jugular temporary hemodialysis catheter on 7/29. IR has also been consulted for renal biopsy   Assessment & Plan:   Principal Problem:   Symptomatic anemia Active Problems:   Type 2 diabetes mellitus with hyperglycemia, with long-term current use of insulin  (HCC)   Essential hypertension   Hyperkalemia   Acute kidney injury superimposed on stage 3b chronic kidney disease (HCC)   1-Acute on chronic anemia, symptomatic: -In the setting of menorrhagia -Patient presented with a hemoglobin of 6.5, prior hemoglobin 9.5----11 -Continue to hold lisinopril  and hydrochlorothiazide  --Received a total of 2 units PRBC>  -B 12 314, started  supplement, Folate 12, iron  169, T sat 46 -Anemia could be related to blood loss anemia and anemia of chronic renal diseases.  Monitor hb.   Menorrhagia: related to Adenomyosis.  TSH: 3.5 Pelvic US : Heterogeneous uterus with findings suggestive of adenomyosis.  Discussed with Dr Barbra, started  Megace  40 mg daily, they  will help facilitate out patient follow up.  No active bleeding.   Pyuria; UTI Continue with IV antibiotics.  Concern for UTI, in the setting of AKI Urine culture grew E coli.  She will complete 5 days IV antibiotics,  ceftriaxone  tomorrow.   AKI on CKD stage IIIb: Metabolic Acidosis, Hyperkalemia -Patient previous creatinine range 1.4---1.6 -2 months ago creatinine of 2.04--- presented with a creatinine of 3.5 -Renal US : Minimal fullness of the left collecting system likely related to the distended bladder. Received Bicarb Gtt.  Nephrology consulted. Recommend initiation of HD for uremic symptoms.  Underwent right internal jugular temporary hemodialysis catheter  Insulin -dependent diabetes: -CBG low side, SSI  Constipation:  KUB negative for obstruction.  glycerin  enema.  Change Miralax  to twice daily add senna Had Small BM.   Essential hypertension -Started  Hydralazine . Increased to 50 mg TID yesterday.  Hold lisinopril  and hydrochlorothiazide >    Estimated body mass index is 25.47 kg/m as calculated from the following:   Height as of this encounter: 4' 11 (1.499 m).   Weight as of this encounter: 57.2 kg.   DVT prophylaxis: SCD Code Status: Full code Family Communication: Care discussed with patient and husband.  Disposition Plan:  Status is: Inpatient Remains inpatient appropriate because: management of Anemia, UTI, AKI     Consultants:  None  Procedures:    Antimicrobials:    Subjective: She is still having nausea and she did not vomited last night.  Not eating much.  She had a small bowel movement. Discussed need for renal biopsy and hemodialysis  Objective: Vitals:   03/22/24 0427 03/22/24 0846 03/22/24 1336 03/22/24 1556  BP: (!) 169/78 (!) 168/87 (!) 155/74 (!) 159/86  Pulse: (!) 104   (!) 102  Resp: 18 18  18   Temp: 98.3 F (36.8 C) 98.2 F (36.8 C)  98.5 F (36.9 C)  TempSrc: Oral  SpO2: 100% 98%  98%  Weight:      Height:        Intake/Output Summary (Last 24 hours) at 03/22/2024 1635 Last data filed at 03/22/2024 1613 Gross per 24 hour  Intake 2451.57 ml  Output 1400 ml  Net 1051.57 ml   Filed Weights   03/18/24 1942  Weight: 57.2 kg     Examination:  General exam: NAD Respiratory system: CTA Cardiovascular system: S 1, S 2 RRR Gastrointestinal system: BS present, soft nt Central nervous system: Alert.  Extremities: No edema   Data Reviewed: I have personally reviewed following labs and imaging studies  CBC: Recent Labs  Lab 03/16/24 0958 03/18/24 2015 03/19/24 0816 03/20/24 0709 03/21/24 0740 03/22/24 0811  WBC 13.5* 16.7* 14.3* 16.1* 14.5* 15.2*  NEUTROABS 10.6* 14.0*  --   --   --   --   HGB 7.3* 6.5* 7.8* 9.2* 8.6* 9.0*  HCT 23.1* 20.2* 23.1* 26.9* 24.8* 26.3*  MCV 92 90.2 87.2 86.8 86.1 86.8  PLT 392 349 301 271 244 266   Basic Metabolic Panel: Recent Labs  Lab 03/18/24 2015 03/19/24 0816 03/20/24 0709 03/21/24 0740 03/22/24 0811  NA 135 134* 137 135 131*  K 5.4* 5.1 5.1 4.4 3.8  CL 113* 112* 113* 103 98  CO2 15* 15* 15* 18* 21*  GLUCOSE 87 110* 137* 140* 180*  BUN 49* 46* 48* 47* 44*  CREATININE 3.52* 3.31* 3.27* 3.85* 3.56*  CALCIUM  8.3* 8.0* 8.3* 7.9* 8.0*   GFR: Estimated Creatinine Clearance: 14.9 mL/min (A) (by C-G formula based on SCr of 3.56 mg/dL (H)). Liver Function Tests: Recent Labs  Lab 03/16/24 0958 03/18/24 2015  AST 10 13*  ALT 7 11  ALKPHOS 84 68  BILITOT <0.2 0.5  PROT 6.2 6.1*  ALBUMIN 3.8* 3.0*   No results for input(s): LIPASE, AMYLASE in the last 168 hours. No results for input(s): AMMONIA in the last 168 hours. Coagulation Profile: No results for input(s): INR, PROTIME in the last 168 hours. Cardiac Enzymes: No results for input(s): CKTOTAL, CKMB, CKMBINDEX, TROPONINI in the last 168 hours. BNP (last 3 results) No results for input(s): PROBNP in the last 8760 hours. HbA1C: No results for input(s): HGBA1C in the last 72 hours.  CBG: Recent Labs  Lab 03/21/24 2110 03/21/24 2224 03/22/24 0844 03/22/24 1256 03/22/24 1557  GLUCAP 172* 173* 209* 125* 129*   Lipid Profile: No results for input(s): CHOL, HDL,  LDLCALC, TRIG, CHOLHDL, LDLDIRECT in the last 72 hours.  Thyroid Function Tests: No results for input(s): TSH, T4TOTAL, FREET4, T3FREE, THYROIDAB in the last 72 hours.  Anemia Panel: No results for input(s): VITAMINB12, FOLATE, FERRITIN, TIBC, IRON , RETICCTPCT in the last 72 hours.  Sepsis Labs: No results for input(s): PROCALCITON, LATICACIDVEN in the last 168 hours.  Recent Results (from the past 240 hours)  Urine Culture (for pregnant, neutropenic or urologic patients or patients with an indwelling urinary catheter)     Status: Abnormal   Collection Time: 03/19/24 12:47 PM   Specimen: Urine, Clean Catch  Result Value Ref Range Status   Specimen Description URINE, CLEAN CATCH  Final   Special Requests   Final    NONE Performed at [facility], [address], [place], [place] [address]    Culture >=100,000 COLONIES/mL ESCHERICHIA COLI (A)  Final   Report Status 03/22/2024 FINAL  Final   Organism ID, Bacteria ESCHERICHIA COLI (A)  Final      Susceptibility   Escherichia  coli - MIC*    AMPICILLIN <=2 SENSITIVE Sensitive     CEFAZOLIN  <=4 SENSITIVE Sensitive     CEFEPIME <=0.12 SENSITIVE Sensitive     CEFTRIAXONE  <=0.25 SENSITIVE Sensitive     CIPROFLOXACIN >=4 RESISTANT Resistant     GENTAMICIN <=1 SENSITIVE Sensitive     IMIPENEM <=0.25 SENSITIVE Sensitive     NITROFURANTOIN <=16 SENSITIVE Sensitive     TRIMETH/SULFA <=20 SENSITIVE Sensitive     AMPICILLIN/SULBACTAM <=2 SENSITIVE Sensitive     PIP/TAZO <=4 SENSITIVE Sensitive ug/mL    * >=100,000 COLONIES/mL ESCHERICHIA COLI         Radiology Studies: IR Fluoro Guide CV Line Right Result Date: 03/22/2024 INDICATION: [id number] Acute kidney injury (HCC) [id number] EXAM: ULTRASOUND GUIDANCE FOR VASCULAR ACCESS RIGHT IJ TEMPORARY DIALYSIS TYPE CATHETER MEDICATIONS: 1% lidocaine  local ANESTHESIA/SEDATION: None. FLUOROSCOPY: Radiation Exposure Index (as provided by the fluoroscopic device):  2.0 mGy Kerma COMPLICATIONS: None immediate. PROCEDURE: Informed written consent was obtained from the patient after a thorough discussion of the procedural risks, benefits and alternatives. All questions were addressed. Maximal Sterile Barrier Technique was utilized including caps, mask, sterile gowns, sterile gloves, sterile drape, hand hygiene and skin antiseptic. A timeout was performed prior to the initiation of the procedure. Under sterile conditions and local anesthesia, ultrasound micropuncture access performed the right internal jugular vein. Images obtained for documentation of the patent right internal jugular vein. 018 guidewire advanced to the IVC. Micro dilator set advanced. Over an Amplatz guidewire, a 16 cm Mahurkar type temporary dialysis catheter was advanced. Tip position in the SVC RA junction. Guidewire removed. Blood aspirated easily followed by saline and heparin  flushes. Appropriate volume strength of heparin  instilled in all lumens followed by external caps. Catheter secured with retention sutures and a sterile dressing. No immediate complication. Patient tolerated the procedure well. IMPRESSION: Successful ultrasound fluoroscopic right IJ temporary dialysis type catheter. Ready for use. Electronically Signed   By: CHRISTELLA.  Shick M.D.   On: 03/22/2024 12:58   IR US  Guide Vasc Access Right Result Date: 03/22/2024 INDICATION: [id number] Acute kidney injury (HCC) [id number] EXAM: ULTRASOUND GUIDANCE FOR VASCULAR ACCESS RIGHT IJ TEMPORARY DIALYSIS TYPE CATHETER MEDICATIONS: 1% lidocaine  local ANESTHESIA/SEDATION: None. FLUOROSCOPY: Radiation Exposure Index (as provided by the fluoroscopic device): 2.0 mGy Kerma COMPLICATIONS: None immediate. PROCEDURE: Informed written consent was obtained from the patient after a thorough discussion of the procedural risks, benefits and alternatives. All questions were addressed. Maximal Sterile Barrier Technique was utilized including caps, mask, sterile gowns, sterile  gloves, sterile drape, hand hygiene and skin antiseptic. A timeout was performed prior to the initiation of the procedure. Under sterile conditions and local anesthesia, ultrasound micropuncture access performed the right internal jugular vein. Images obtained for documentation of the patent right internal jugular vein. 018 guidewire advanced to the IVC. Micro dilator set advanced. Over an Amplatz guidewire, a 16 cm Mahurkar type temporary dialysis catheter was advanced. Tip position in the SVC RA junction. Guidewire removed. Blood aspirated easily followed by saline and heparin  flushes. Appropriate volume strength of heparin  instilled in all lumens followed by external caps. Catheter secured with retention sutures and a sterile dressing. No immediate complication. Patient tolerated the procedure well. IMPRESSION: Successful ultrasound fluoroscopic right IJ temporary dialysis type catheter. Ready for use. Electronically Signed   By: CHRISTELLA.  Shick M.D.   On: 03/22/2024 12:58   DG Abd 1 View Result Date: 03/21/2024 CLINICAL DATA:  Nausea and vomiting. EXAM: ABDOMEN - 1 VIEW COMPARISON:  None Available. FINDINGS: The bowel  gas pattern is normal. A large stool burden is seen throughout the colon. No radio-opaque calculi or other significant radiographic abnormality are seen. IMPRESSION: Negative. Electronically Signed   By: Suzen Dials M.D.   On: 03/21/2024 13:24        Scheduled Meds:  Chlorhexidine  Gluconate Cloth  6 each Topical Q0600   hydrALAZINE   50 mg Oral Q8H   insulin  aspart  0-15 Units Subcutaneous TID WC   insulin  aspart  0-5 Units Subcutaneous QHS   megestrol   40 mg Oral Daily   pantoprazole  (PROTONIX ) IV  40 mg Intravenous Q12H   polyethylene glycol  17 g Oral BID   rosuvastatin   10 mg Oral Daily   senna  1 tablet Oral Daily   vitamin B-12  500 mcg Oral Daily   Continuous Infusions:  cefTRIAXone  (ROCEPHIN )  IV 2 g (03/22/24 1342)   sodium bicarbonate  75 mEq in sodium chloride  0.45  % 1,075 mL infusion Stopped (03/22/24 1342)     LOS: 4 days    Time spent: 35 minutes    Tashea Othman A Runa Whittingham, MD [facility]   If 7PM-7AM, please contact night-coverage [email or web address]  03/22/2024, 4:35 PM

## 2024-03-22 NOTE — Progress Notes (Signed)
 Nurse called from dialysis...coming to get patient now for a treatment. Patient voided before going to trearment. Spanish interpreter used. Patient had no questions

## 2024-03-22 NOTE — Progress Notes (Signed)
 Referring Physician(s): Dr. Lynwood Farr  Supervising Physician: Vanice Revel  Patient Status:  [facility] - In-pt  Chief Complaint: Acute renal failure s/p placement of temporary dialysis catheter in IR 03/22/24. Nephrology now requesting non-focal renal biopsy.   Subjective: Patient resting comfortably in bed. She is sleepy but able to participate in the conversation. A female family member is at the bedside.   ** Please see full H&P written today by Wyatt Pommier, PA  Allergies: Amlodipine   Medications: Prior to Admission medications   Medication Sig Start Date End Date Taking? Authorizing Provider  Dulaglutide  (TRULICITY ) 1.5 MG/0.5ML SOAJ Inject 1.5 mg into the skin once a week. 03/16/24  Yes Celestia Rosaline SQUIBB, NP  hydrochlorothiazide  (HYDRODIURIL ) 25 MG tablet Take 1 tablet (25 mg total) by mouth daily. 03/16/24  Yes Celestia Rosaline SQUIBB, NP  Insulin  Glargine (BASAGLAR  KWIKPEN) 100 UNIT/ML Inject 26 Units into the skin daily. Patient taking differently: Inject 20 Units into the skin daily. 03/16/24  Yes Celestia Rosaline SQUIBB, NP  lisinopril  (ZESTRIL ) 20 MG tablet Take 1 tablet (20 mg total) by mouth daily. 03/16/24  Yes Celestia Rosaline SQUIBB, NP  rosuvastatin  (CRESTOR ) 10 MG tablet Take 1 tablet (10 mg total) by mouth daily. 06/17/23  Yes Mayers, Cari S, PA-C  glucose blood (TRUE METRIX BLOOD GLUCOSE TEST) test strip Use to check blood sugar three times daily. 07/16/23   Newlin, Enobong, MD  Insulin  Pen Needle (PENTIPS) 32G X 4 MM MISC use as directed 06/17/23   Mayers, Cari S, PA-C  TRUEplus Lancets 28G MISC Use to check blood sugar three times daily. 07/16/23   Delbert Clam, MD     Vital Signs: BP (!) 155/74   Pulse (!) 104   Temp 98.2 F (36.8 C)   Resp 18   Ht 4' 11 (1.499 m)   Wt 126 lb 1.7 oz (57.2 kg)   LMP 03/04/2024   SpO2 98%   BMI 25.47 kg/m   Physical Exam Constitutional:      General: She is not in acute distress.    Appearance: She is not ill-appearing.   Cardiovascular:     Comments: Right internal jugular temporary dialysis catheter. Site is clean and dry.  Pulmonary:     Effort: Pulmonary effort is normal.  Skin:    General: Skin is warm and dry.  Neurological:     Mental Status: She is alert and oriented to person, place, and time.     Imaging:   Labs:  CBC: Recent Labs    03/19/24 0816 03/20/24 0709 03/21/24 0740 03/22/24 0811  WBC 14.3* 16.1* 14.5* 15.2*  HGB 7.8* 9.2* 8.6* 9.0*  HCT 23.1* 26.9* 24.8* 26.3*  PLT 301 271 244 266    COAGS: No results for input(s): INR, APTT in the last 8760 hours.  BMP: Recent Labs    03/19/24 0816 03/20/24 0709 03/21/24 0740 03/22/24 0811  NA 134* 137 135 131*  K 5.1 5.1 4.4 3.8  CL 112* 113* 103 98  CO2 15* 15* 18* 21*  GLUCOSE 110* 137* 140* 180*  BUN 46* 48* 47* 44*  CALCIUM  8.0* 8.3* 7.9* 8.0*  CREATININE 3.31* 3.27* 3.85* 3.56*  GFRNONAA 17* 17* 14* 15*    LIVER FUNCTION TESTS: Recent Labs    07/16/23 0949 12/16/23 1007 03/16/24 0958 03/18/24 2015  BILITOT 0.2 0.2 <0.2 0.5  AST 9 11 10  13*  ALT 10 7 7 11   ALKPHOS 104 92 84 68  PROT 6.9 5.7* 6.2 6.1*  ALBUMIN 3.9 3.4* 3.8* 3.0*    Assessment and Plan:  Acute renal failure s/p placement of temporary dialysis catheter in IR. Nephrology now requesting non-focal renal biopsy.   Procedure discussed at the bedside with the patient with the assistance of a Spanish-Speaking video interpreter Ronaldo, # [phone]). Patient agreeable to proceed with image-guided non-focal renal biopsy. She is not receiving any blood-thinning medications. She will be NPO at midnight.   Risks and benefits of non-focal renal biopsy were discussed with the patient and/or patient's family including, but not limited to bleeding, infection, damage to adjacent structures or low yield requiring additional tests.  All of the questions were answered and there is agreement to proceed.  Consent signed and in chart.  Electronically  Signed: Warren Dais, AGACNP-BC 03/22/2024, 3:40 PM   I spent a total of 25 Minutes at the the patient's bedside AND on the patient's hospital floor or unit, greater than 50% of which was counseling/coordinating care for chronic kidney disease.

## 2024-03-22 NOTE — Plan of Care (Signed)
  Problem: Nutritional: Goal: Maintenance of adequate nutrition will improve Outcome: Progressing   Problem: Education: Goal: Knowledge of General Education information will improve Description: Including pain rating scale, medication(s)/side effects and non-pharmacologic comfort measures Outcome: Progressing   Problem: Activity: Goal: Risk for activity intolerance will decrease Outcome: Progressing

## 2024-03-22 NOTE — Consult Note (Signed)
 Chief Complaint: Hx of CKD stage 3b, acute renal failure requiring dialysis - IR consulted for tunneled hemodialysis catheter placement  Referring Provider(s): Melia Lynwood ORN, MD   Supervising Physician: Vanice Revel  Patient Status: [facility] - In-pt  History of Present Illness: Michele Morales is a 48 y.o. female with pmhx DM, HLD, HTN. Presented to [facility] ED 03/18/24 due to abnormal labs (low hgb and elevated creatinine). She was having associated shortness of breath, nausea/vomiting and decreased PO intake. Pt was admitted and evaluated by nephrology yesterday - given uncontrolled diabetes and worsening renal function over the last 6 months, concern for development of uremia with underlying renal failure and plan to initiate hemodialysis. IR now consulted for tunneled dialysis catheter placement per nephrology request.  Spanish interpreter used on ipad at bedside for today's evaluation.   Patient is Full Code  Past Medical History:  Diagnosis Date   Diabetes mellitus without complication (HCC)    IDDM   Hypercholesteremia    Hypertension    Neuromuscular disorder (HCC)    diabetic neuropathy feet    Past Surgical History:  Procedure Laterality Date   hand procedure Right    right wrist - used sedation to reset in the ER Dept   ORIF WRIST FRACTURE Right 10/15/2022   Procedure: OPEN REDUCTION INTERNAL FIXATION (ORIF) WRIST FRACTURE;  Surgeon: Shari Easter, MD;  Location: [facility] OR;  Service: Orthopedics;  Laterality: Right;  regional with iv sedation    Allergies: Amlodipine   Medications: Prior to Admission medications   Medication Sig Start Date End Date Taking? Authorizing Provider  Dulaglutide  (TRULICITY ) 1.5 MG/0.5ML SOAJ Inject 1.5 mg into the skin once a week. 03/16/24  Yes Celestia Rosaline SQUIBB, NP  hydrochlorothiazide  (HYDRODIURIL ) 25 MG tablet Take 1 tablet (25 mg total) by mouth daily. 03/16/24  Yes Celestia Rosaline SQUIBB, NP  Insulin  Glargine (BASAGLAR  KWIKPEN) 100 UNIT/ML  Inject 26 Units into the skin daily. Patient taking differently: Inject 20 Units into the skin daily. 03/16/24  Yes Celestia Rosaline SQUIBB, NP  lisinopril  (ZESTRIL ) 20 MG tablet Take 1 tablet (20 mg total) by mouth daily. 03/16/24  Yes Celestia Rosaline SQUIBB, NP  rosuvastatin  (CRESTOR ) 10 MG tablet Take 1 tablet (10 mg total) by mouth daily. 06/17/23  Yes Mayers, Cari S, PA-C  glucose blood (TRUE METRIX BLOOD GLUCOSE TEST) test strip Use to check blood sugar three times daily. 07/16/23   Newlin, Enobong, MD  Insulin  Pen Needle (PENTIPS) 32G X 4 MM MISC use as directed 06/17/23   Mayers, Cari S, PA-C  TRUEplus Lancets 28G MISC Use to check blood sugar three times daily. 07/16/23   Delbert Clam, MD     Family History  Problem Relation Age of Onset   Diabetes Mother     Social History   Socioeconomic History   Marital status: Single    Spouse name: Not on file   Number of children: Not on file   Years of education: Not on file   Highest education level: Not on file  Occupational History   Not on file  Tobacco Use   Smoking status: Never   Smokeless tobacco: Never  Vaping Use   Vaping status: Never Used  Substance and Sexual Activity   Alcohol use: No   Drug use: No   Sexual activity: Not Currently    Birth control/protection: Injection    Comment: Depro  Other Topics Concern   Not on file  Social History Narrative   Not on file  Social Drivers of Corporate investment banker Strain: Not on file  Food Insecurity: No Food Insecurity (03/19/2024)   Hunger Vital Sign    Worried About Running Out of Food in the Last Year: Never true    Ran Out of Food in the Last Year: Never true  Transportation Needs: No Transportation Needs (03/19/2024)   PRAPARE - Administrator, Civil Service (Medical): No    Lack of Transportation (Non-Medical): No  Physical Activity: Not on file  Stress: Not on file  Social Connections: Not on file     Review of Systems: A 12 point ROS  discussed and pertinent positives are indicated in the HPI above.  All other systems are negative.  Review of Systems  Gastrointestinal:  Positive for nausea.  Neurological:  Positive for headaches.    Vital Signs: BP (!) 168/87 (BP Location: Right Arm)   Pulse (!) 104   Temp 98.2 F (36.8 C)   Resp 18   Ht 4' 11 (1.499 m)   Wt 126 lb 1.7 oz (57.2 kg)   LMP 03/04/2024   SpO2 98%   BMI 25.47 kg/m   Advance Care Plan: The advanced care place/surrogate decision maker was discussed at the time of visit and the patient did not wish to discuss or was not able to name a surrogate decision maker or provide an advance care plan.  Physical Exam Vitals and nursing note reviewed.  Constitutional:      Appearance: Normal appearance.  HENT:     Mouth/Throat:     Mouth: Mucous membranes are moist.     Pharynx: Oropharynx is clear.  Cardiovascular:     Rate and Rhythm: Tachycardia present.  Pulmonary:     Effort: Pulmonary effort is normal.     Breath sounds: Normal breath sounds.  Abdominal:     Palpations: Abdomen is soft.     Tenderness: There is no abdominal tenderness.  Musculoskeletal:     Right lower leg: No edema.     Left lower leg: No edema.  Skin:    General: Skin is warm and dry.  Neurological:     Mental Status: She is alert and oriented to person, place, and time. Mental status is at baseline.     Imaging: DG Abd 1 View Result Date: 03/21/2024 CLINICAL DATA:  Nausea and vomiting. EXAM: ABDOMEN - 1 VIEW COMPARISON:  None Available. FINDINGS: The bowel gas pattern is normal. A large stool burden is seen throughout the colon. No radio-opaque calculi or other significant radiographic abnormality are seen. IMPRESSION: Negative. Electronically Signed   By: Suzen Dials M.D.   On: 03/21/2024 13:24   US  PELVIS (TRANSABDOMINAL ONLY) Result Date: 03/19/2024 CLINICAL DATA:  Abnormal uterine bleeding. EXAM: TRANSABDOMINAL ULTRASOUND OF PELVIS TECHNIQUE: Transabdominal  ultrasound examination of the pelvis was performed including evaluation of the uterus, ovaries, adnexal regions, and pelvic cul-de-sac. COMPARISON:  None Available. FINDINGS: Uterus Measurements: 10.6 x 4.7 x 5.3 cm = volume: 138 mL. The uterus demonstrates a heterogeneous echotexture with findings suspicious for adenomyosis. Endometrium Thickness: 14 mm. No focal abnormality visualized. There is indistinct junctional zone. Right ovary Measurements: 2.8 x 1.6 x 2.5 cm = volume: 6.1 mL. There is a 2 cm cyst in the right ovary. Left ovary Measurements: 4.1 x 2.6 x 3.4 cm = volume: 19 mL. Normal appearance/no adnexal mass. No free fluid in the pelvis. Faint echogenic floating debris in the urinary bladder. Correlation with urinalysis recommended to exclude UTI. IMPRESSION:  1. Heterogeneous uterus with findings suggestive of adenomyosis. 2. Unremarkable ovaries. Electronically Signed   By: Vanetta Chou M.D.   On: 03/19/2024 10:20   US  RENAL Result Date: 03/19/2024 CLINICAL DATA:  Acute renal injury EXAM: RENAL / URINARY TRACT ULTRASOUND COMPLETE COMPARISON:  None Available. FINDINGS: Right Kidney: Renal measurements: 9.8 x 4.6 x 5.2 cm. = volume: 125 mL. Echogenicity within normal limits. No mass or hydronephrosis visualized. Left Kidney: Renal measurements: 10.9 x 5.9 x 5.8 cm. = volume: 194 mL. Minimal fullness of the left collecting system is noted. This persists following voiding although significant decompression of the bladder was not seen. Bladder: Appears normal for degree of bladder distention. Other: None. IMPRESSION: Minimal fullness of the left collecting system likely related to the distended bladder. Electronically Signed   By: Oneil Devonshire M.D.   On: 03/19/2024 01:44    Labs:  CBC: Recent Labs    03/19/24 0816 03/20/24 0709 03/21/24 0740 03/22/24 0811  WBC 14.3* 16.1* 14.5* 15.2*  HGB 7.8* 9.2* 8.6* 9.0*  HCT 23.1* 26.9* 24.8* 26.3*  PLT 301 271 244 266    COAGS: No results for  input(s): INR, APTT in the last 8760 hours.  BMP: Recent Labs    03/19/24 0816 03/20/24 0709 03/21/24 0740 03/22/24 0811  NA 134* 137 135 131*  K 5.1 5.1 4.4 3.8  CL 112* 113* 103 98  CO2 15* 15* 18* 21*  GLUCOSE 110* 137* 140* 180*  BUN 46* 48* 47* 44*  CALCIUM  8.0* 8.3* 7.9* 8.0*  CREATININE 3.31* 3.27* 3.85* 3.56*  GFRNONAA 17* 17* 14* 15*    LIVER FUNCTION TESTS: Recent Labs    07/16/23 0949 12/16/23 1007 03/16/24 0958 03/18/24 2015  BILITOT 0.2 0.2 <0.2 0.5  AST 9 11 10  13*  ALT 10 7 7 11   ALKPHOS 104 92 84 68  PROT 6.9 5.7* 6.2 6.1*  ALBUMIN 3.9 3.4* 3.8* 3.0*    TUMOR MARKERS: No results for input(s): AFPTM, CEA, CA199, CHROMGRNA in the last 8760 hours.  Assessment and Plan:  Talayia Hjort is a 48 y.o. female with pmhx DM, HLD, HTN. Presented to [facility] ED 03/18/24 due to abnormal labs (low hgb and elevated creatinine). She was having associated shortness of breath, nausea/vomiting and decreased PO intake. Pt was admitted and evaluated by nephrology yesterday - given uncontrolled diabetes and worsening renal function over the last 6 months, concern for development of uremia with underlying renal failure and plan to initiate hemodialysis. IR now consulted for tunneled dialysis catheter placement per nephrology request.   Risks and benefits discussed with the patient including, but not limited to bleeding, infection, vascular injury, pneumothorax which may require chest tube placement, air embolism or even death. All of the patient's questions were answered, patient is agreeable to proceed. Consent signed and in chart.   Thank you for allowing our service to participate in Michele Morales 's care.  Electronically Signed: Kimble VEAR Clas, PA-C   03/22/2024, 9:36 AM      I spent a total of 20 Minutes    in face to face in clinical consultation, greater than 50% of which was counseling/coordinating care for tunneled hemodialysis catheter placement.

## 2024-03-22 NOTE — Progress Notes (Addendum)
 Pt is a 48 y.o. yo female who was admitted on 03/18/2024 with Symptomatic anemia, UTI and RF.    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 Last urine ACR 2,424 checked on 05/2023. She also notes ongoing daily nausea/vomiting with decrease PO intake for 2 months already, decrease urine frequency. US  renal noted for minimal fullness of L collecting system related to distended bladder, no obstruction or abscess noted. Concern about rapid progression of kidney disease, as Scr 0.72 noted 2/24, then Scr 1.62 10/24. Concerned about uremia with underlying renal failure.  - Scr 3.56 (3.85), BUN 44 (47), minimal improvement - UOP 700 cc, net +2.1L  - Request for VIR temp placement today (elevated WBC) - Plan on renal biopsy this week; will check serologies as well. Fairly rapid progression if diabetic nephropathy but possible. - NS 75 mL/hr infusion  - Strict Is and Os  - Renally dose medications  Manage by Primary: AoC Anemia/Anemia of chronic kidney disease - Hgb 9.0 (8.6), Stable   UTI/Leukocytosis UA noted for proteinuria, large leukocytes and rare bacteria. Urine culture +E. Coli.  - Management per primary   Hypertension Hold home lisinopril  20 mg and hydrochlorothiazide  25 mg.  - Manage by primary   DM  Subjective:  Patient is evaluated at bedside, reports an episode of emesis this AM, feeling nauseous. Reports she ate little bit of dinner yesterday. Denies any abdominal pain.   Objective Vital signs in last 24 hours: Vitals:   03/21/24 1628 03/21/24 2106 03/22/24 0427 03/22/24 0846  BP: (!) 156/85 (!) 158/84 (!) 169/78 (!) 168/87  Pulse: (!) 106 (!) 104 (!) 104   Resp: 17 18 18 18   Temp: 98.4 F (36.9 C) 98.2 F (36.8 C) 98.3 F (36.8 C) 98.2 F (36.8 C)  TempSrc: Oral Oral Oral   SpO2: 98% 97% 100% 98%  Weight:      Height:       Weight change:    Intake/Output Summary (Last 24 hours) at 03/22/2024 0937 Last data filed at 03/22/2024 0400 Gross per 24 hour  Intake 2811.57 ml  Output 700 ml  Net 2111.57 ml      Labs: Basic Metabolic Panel: Recent Labs  Lab 03/20/24 0709 03/21/24 0740 03/22/24 0811  NA 137 135 131*  K 5.1 4.4 3.8  CL 113* 103 98  CO2 15* 18* 21*  GLUCOSE 137* 140* 180*  BUN 48* 47* 44*  CREATININE 3.27* 3.85* 3.56*  CALCIUM  8.3* 7.9* 8.0*   Liver Function Tests: Recent Labs  Lab 03/16/24 0958 03/18/24 2015  AST 10 13*  ALT 7 11  ALKPHOS 84 68  BILITOT <0.2 0.5  PROT 6.2 6.1*  ALBUMIN 3.8* 3.0*   No results for input(s): LIPASE, AMYLASE in the last 168 hours. No results for input(s): AMMONIA in the last 168 hours. CBC: Recent Labs  Lab 03/16/24 0958 03/18/24 2015 03/18/24 2015 03/19/24 0816 03/20/24 0709 03/21/24 0740 03/22/24 0811  WBC 13.5* 16.7*   < > 14.3* 16.1* 14.5* 15.2*  NEUTROABS 10.6* 14.0*  --   --   --   --   --   HGB 7.3* 6.5*   < > 7.8* 9.2* 8.6* 9.0*  HCT 23.1* 20.2*   < > 23.1* 26.9* 24.8* 26.3*  MCV 92 90.2  --  87.2 86.8 86.1 86.8  PLT 392 349   < > 301 271  244 266   < > = values in this interval not displayed.   Cardiac Enzymes: No results for input(s): CKTOTAL, CKMB, CKMBINDEX, TROPONINI in the last 168 hours. CBG: Recent Labs  Lab 03/21/24 1201 03/21/24 1629 03/21/24 2110 03/21/24 2224 03/22/24 0844  GLUCAP 183* 150* 172* 173* 209*    Iron  Studies: No results for input(s): IRON , TIBC, TRANSFERRIN, FERRITIN in the last 72 hours. Studies/Results: DG Abd 1 View Result Date: 03/21/2024 CLINICAL DATA:  Nausea and vomiting. EXAM: ABDOMEN - 1 VIEW COMPARISON:  None Available. FINDINGS: The bowel gas pattern is normal. A large stool burden is seen throughout the colon. No radio-opaque calculi or other significant radiographic abnormality are seen. IMPRESSION: Negative. Electronically Signed   By: Suzen Dials M.D.   On: 03/21/2024  13:24    Medications: Infusions:  cefTRIAXone  (ROCEPHIN )  IV 200 mL/hr at 03/21/24 1653   sodium bicarbonate  75 mEq in sodium chloride  0.45 % 1,075 mL infusion 100 mL/hr at 03/22/24 0034    Scheduled Medications:  Chlorhexidine  Gluconate Cloth  6 each Topical Q0600   hydrALAZINE   50 mg Oral Q8H   insulin  aspart  0-15 Units Subcutaneous TID WC   insulin  aspart  0-5 Units Subcutaneous QHS   megestrol   40 mg Oral Daily   pantoprazole  (PROTONIX ) IV  40 mg Intravenous Q12H   polyethylene glycol  17 g Oral Daily   rosuvastatin   10 mg Oral Daily   vitamin B-12  500 mcg Oral Daily    have reviewed scheduled and prn medications.  Physical Exam: General: Laying in bed, no acute distress Heart: Tachycardiac  Lungs: CTAB Abdomen: soft, NT, ND Extremities: warm, no LE edema  Dialysis Access: None     03/22/2024,9:37 AM  LOS: 4 days

## 2024-03-22 NOTE — Plan of Care (Signed)
°  Problem: Fluid Volume: Goal: Ability to maintain a balanced intake and output will improve Outcome: Progressing   Problem: Nutritional: Goal: Maintenance of adequate nutrition will improve Outcome: Progressing   Problem: Clinical Measurements: Goal: Ability to maintain clinical measurements within normal limits will improve Outcome: Progressing

## 2024-03-23 LAB — BASIC METABOLIC PANEL WITH GFR
Anion gap: 11 (ref 5–15)
BUN: 23 mg/dL — ABNORMAL HIGH (ref 6–20)
CO2: 23 mmol/L (ref 22–32)
Calcium: 7.5 mg/dL — ABNORMAL LOW (ref 8.9–10.3)
Chloride: 99 mmol/L (ref 98–111)
Creatinine, Ser: 2.34 mg/dL — ABNORMAL HIGH (ref 0.44–1.00)
GFR, Estimated: 25 mL/min — ABNORMAL LOW (ref >60)
Glucose, Bld: 172 mg/dL — ABNORMAL HIGH (ref 70–99)
Potassium: 3.8 mmol/L (ref 3.5–5.1)
Sodium: 133 mmol/L — ABNORMAL LOW (ref 135–145)

## 2024-03-23 LAB — CBC
HCT: 25.3 % — ABNORMAL LOW (ref 36.0–46.0)
Hemoglobin: 8.5 g/dL — ABNORMAL LOW (ref 12.0–15.0)
MCH: 29 pg (ref 26.0–34.0)
MCHC: 33.6 g/dL (ref 30.0–36.0)
MCV: 86.3 fL (ref 80.0–100.0)
Platelets: 229 K/uL (ref 150–400)
RBC: 2.93 MIL/uL — ABNORMAL LOW (ref 3.87–5.11)
RDW: 13.8 % (ref 11.5–15.5)
WBC: 14.7 K/uL — ABNORMAL HIGH (ref 4.0–10.5)
nRBC: 0 % (ref 0.0–0.2)

## 2024-03-23 LAB — PROTEIN ELECTROPHORESIS, SERUM
A/G Ratio: 1.2 (ref 0.7–1.7)
Albumin ELP: 2.8 g/dL — ABNORMAL LOW (ref 2.9–4.4)
Alpha-1-Globulin: 0.3 g/dL (ref 0.0–0.4)
Alpha-2-Globulin: 0.6 g/dL (ref 0.4–1.0)
Beta Globulin: 0.8 g/dL (ref 0.7–1.3)
Gamma Globulin: 0.7 g/dL (ref 0.4–1.8)
Globulin, Total: 2.3 g/dL (ref 2.2–3.9)
Total Protein ELP: 5.1 g/dL — ABNORMAL LOW (ref 6.0–8.5)

## 2024-03-23 LAB — GLUCOSE, CAPILLARY
Glucose-Capillary: 191 mg/dL — ABNORMAL HIGH (ref 70–99)
Glucose-Capillary: 176 mg/dL — ABNORMAL HIGH (ref 70–99)
Glucose-Capillary: 232 mg/dL — ABNORMAL HIGH (ref 70–99)
Glucose-Capillary: 178 mg/dL — ABNORMAL HIGH (ref 70–99)

## 2024-03-23 LAB — PROTIME-INR
INR: 1.1 (ref 0.8–1.2)
Prothrombin Time: 14.5 s (ref 11.4–15.2)

## 2024-03-23 LAB — KAPPA/LAMBDA LIGHT CHAINS
Kappa free light chain: 62.6 mg/L — ABNORMAL HIGH (ref 3.3–19.4)
Kappa, lambda light chain ratio: 1.04 (ref 0.26–1.65)
Lambda free light chains: 60.2 mg/L — ABNORMAL HIGH (ref 5.7–26.3)

## 2024-03-23 LAB — C3 COMPLEMENT: C3 Complement: 97 mg/dL (ref 82–167)

## 2024-03-23 LAB — FERRITIN: Ferritin: 32 ng/mL (ref 11–307)

## 2024-03-23 LAB — C4 COMPLEMENT: Complement C4, Body Fluid: 20 mg/dL (ref 12–38)

## 2024-03-23 LAB — HEPATITIS B SURFACE ANTIBODY, QUANTITATIVE: Hep B S AB Quant (Post): <3.5 m[IU]/mL — ABNORMAL LOW

## 2024-03-23 MED ORDER — TRAZODONE HCL 50 MG PO TABS: 50.0000 mg | ORAL_TABLET | Freq: Every evening | ORAL | Status: DC | PRN

## 2024-03-23 MED ORDER — HEPARIN SODIUM (PORCINE) 1000 UNIT/ML IJ SOLN
INTRAMUSCULAR | Status: AC
  Filled 2024-03-23: qty 4

## 2024-03-23 MED ORDER — GUAIFENESIN 100 MG/5ML PO LIQD: 5.0000 mL | ORAL | Status: DC | PRN

## 2024-03-23 MED ORDER — HYDRALAZINE HCL 20 MG/ML IJ SOLN: 10.0000 mg | INTRAMUSCULAR | Status: DC | PRN

## 2024-03-23 MED ORDER — ONDANSETRON HCL 4 MG/2ML IJ SOLN
4.0000 mg | Freq: Four times a day (QID) | INTRAMUSCULAR | Status: DC | PRN
  Administered 2024-03-24 – 2024-03-29 (×5): 4 mg via INTRAVENOUS
  Filled 2024-03-23 (×5): qty 2

## 2024-03-23 MED ORDER — LABETALOL HCL 100 MG PO TABS
100.0000 mg | ORAL_TABLET | Freq: Two times a day (BID) | ORAL | Status: DC
  Administered 2024-03-23: 100 mg via ORAL
  Filled 2024-03-23 (×3): qty 1

## 2024-03-23 MED ORDER — SENNOSIDES-DOCUSATE SODIUM 8.6-50 MG PO TABS: 1.0000 | ORAL_TABLET | Freq: Every evening | ORAL | Status: DC | PRN

## 2024-03-23 MED ORDER — HEPARIN SODIUM (PORCINE) 1000 UNIT/ML IJ SOLN
4000.0000 [IU] | Freq: Once | INTRAMUSCULAR | Status: AC
  Administered 2024-03-23: 4000 [IU]

## 2024-03-23 MED ORDER — IPRATROPIUM-ALBUTEROL 0.5-2.5 (3) MG/3ML IN SOLN: 3.0000 mL | RESPIRATORY_TRACT | Status: DC | PRN

## 2024-03-23 NOTE — Progress Notes (Signed)
 Utilize stratus interpreter, Spanish interpreter Mickeal [phone] to administer medication and address pt's needs.

## 2024-03-23 NOTE — Plan of Care (Signed)
  Problem: Coping: Goal: Ability to adjust to condition or change in health will improve Outcome: Progressing   Problem: Education: Goal: Knowledge of General Education information will improve Description: Including pain rating scale, medication(s)/side effects and non-pharmacologic comfort measures Outcome: Progressing   Problem: Coping: Goal: Level of anxiety will decrease Outcome: Progressing

## 2024-03-23 NOTE — Progress Notes (Signed)
 Utilized tonita interpreter, Spanish interpreter, [place] [phone] assist with medication administration and address pt's needs.

## 2024-03-23 NOTE — Progress Notes (Signed)
 Utilized [employer] interpreter, [employer], [place] [phone] assist with medication administration, assessment, pt experiencing nausea and headaches. Dr. Burgess Dare at bedside. Pt refuse oral medications due to nausea. IV BP and nausea meds given. Will continue to monitor.

## 2024-03-23 NOTE — Progress Notes (Addendum)
 Pt is a 48 y.o. yo female who was admitted on 03/18/2024 with symptomatic anemia. Past medical history of insulin -dependent T2DM w/ hyperglycemia, hypertension, hyperlipidemia, diabetic neuropathy presenting to ED 7/25 after referred by her PCP due to abnormal lab work noting Scr 2.5 and Hgb 6.5 checked on 7/23. In the ED, labs were pertinent for Hgb 6.5, WBC 16.7, K 5.4, bicarb 15, Scr 3.52, BUN 49. FOBT negative. UA noted for proteinuria, large leukocytes and rare bacteria. Urine culture +E. Coli.  History taken with assistance of video interpretor. Daily nausea for past 2 months  Assessment/Plan:  ESRD s/p R internal jugular Temp Cath HD  Prior CKD stage IIIb, A3  Presented with Scr 3.52, baseline ~2.04 noted 08/2023. Last urine ACR 2,424 checked on 05/2023. She also notes ongoing daily nausea/vomiting with decrease PO intake for 2 months already, decrease urine frequency. US  renal noted for minimal fullness of L collecting system related to distended bladder, no obstruction or abscess noted. Concern about rapid progression of kidney disease, as Scr 0.72 noted 2/24, then Scr 1.62 10/24. Concerned about uremia with underlying renal failure.  - S/p Right internal jugular temporary HD cath; will eventually need conversion to a tunneled catheter.  Temporary place because of elevated white count.  - HD this AM, 0 mL removed; plan next HD Fri.  - UOP 1.5 L, net -1.3L   - Renal Biopsy tomorrow, NPO at midnight; will check serologies as well. Fairly rapid progression if diabetic nephropathy but possible. - Strict Is and Os  - Renally dose medications   Manage by Primary: AoC Anemia/Anemia of chronic kidney disease - Hgb 8.5 (9.0), Stable    UTI/Leukocytosis UA noted for proteinuria, large leukocytes and rare bacteria. Urine culture +E. Coli.  - Management per primary    Hypertension Hold home lisinopril  20 mg and hydrochlorothiazide  25 mg.  He on hydralazine  with labetalol  started on 7/30.  Blood  pressure is already controlled on repeat.  I wonder if the labetalol  might actually cause her to be hypotensive.  I have independently taken a history, reviewed the chart and examined the patient. This patient encounter includes evaluation, adjustment of therapies in at least one of the key components with review of  Dr. Laray note, impression and recommendations.    I've also already edited and made adjustments to the above mentioned note.  MELIA LYNWOOD ORN, MD 03/23/2024, 12:14 PM  Subjective: Patient is evaluated bedside, sitting beside bed, hunched over, still some nausa. Headache as well.  Patient reports that she feels overall weak, unable to tolerate any p.o. intake.  Objective Vital signs in last 24 hours: Vitals:   03/23/24 0200 03/23/24 0215 03/23/24 0333 03/23/24 0511  BP: (!) 171/86 (!) 163/81 (!) 157/76 (!) 165/83  Pulse: (!) 104 (!) 106 (!) 105 (!) 105  Resp: 20 13 16 16   Temp:  98.7 F (37.1 C) 98.9 F (37.2 C) 98.6 F (37 C)  TempSrc:  Oral Oral Oral  SpO2: 100% 100% 98% 93%  Weight:      Height:       Weight change:   Intake/Output Summary (Last 24 hours) at 03/23/2024 0836 Last data filed at 03/23/2024 0215 Gross per 24 hour  Intake 240 ml  Output 1550 ml  Net -1310 ml     Labs: Basic Metabolic Panel: Recent Labs  Lab 03/20/24 0709 03/21/24 0740 03/22/24 0811  NA 137 135 131*  K 5.1 4.4 3.8  CL 113* 103 98  CO2 15* 18* 21*  GLUCOSE 137* 140* 180*  BUN 48* 47* 44*  CREATININE 3.27* 3.85* 3.56*  CALCIUM  8.3* 7.9* 8.0*   Liver Function Tests: Recent Labs  Lab 03/16/24 0958 03/18/24 2015  AST 10 13*  ALT 7 11  ALKPHOS 84 68  BILITOT <0.2 0.5  PROT 6.2 6.1*  ALBUMIN 3.8* 3.0*   No results for input(s): LIPASE, AMYLASE in the last 168 hours. No results for input(s): AMMONIA in the last 168 hours. CBC: Recent Labs  Lab 03/16/24 0958 03/18/24 2015 03/18/24 2015 03/19/24 0816 03/20/24 0709 03/21/24 0740 03/22/24 0811  03/23/24 0557  WBC 13.5* 16.7*   < > 14.3* 16.1* 14.5* 15.2* 14.7*  NEUTROABS 10.6* 14.0*  --   --   --   --   --   --   HGB 7.3* 6.5*   < > 7.8* 9.2* 8.6* 9.0* 8.5*  HCT 23.1* 20.2*   < > 23.1* 26.9* 24.8* 26.3* 25.3*  MCV 92 90.2   < > 87.2 86.8 86.1 86.8 86.3  PLT 392 349   < > 301 271 244 266 229   < > = values in this interval not displayed.   Cardiac Enzymes: No results for input(s): CKTOTAL, CKMB, CKMBINDEX, TROPONINI in the last 168 hours. CBG: Recent Labs  Lab 03/21/24 2224 03/22/24 0844 03/22/24 1256 03/22/24 1557 03/22/24 2026  GLUCAP 173* 209* 125* 129* 161*    Iron  Studies: No results for input(s): IRON , TIBC, TRANSFERRIN, FERRITIN in the last 72 hours. Studies/Results: IR Fluoro Guide CV Line Right Result Date: 03/22/2024 INDICATION: [id number] Acute kidney injury (HCC) [id number] EXAM: ULTRASOUND GUIDANCE FOR VASCULAR ACCESS RIGHT IJ TEMPORARY DIALYSIS TYPE CATHETER MEDICATIONS: 1% lidocaine  local ANESTHESIA/SEDATION: None. FLUOROSCOPY: Radiation Exposure Index (as provided by the fluoroscopic device): 2.0 mGy Kerma COMPLICATIONS: None immediate. PROCEDURE: Informed written consent was obtained from the patient after a thorough discussion of the procedural risks, benefits and alternatives. All questions were addressed. Maximal Sterile Barrier Technique was utilized including caps, mask, sterile gowns, sterile gloves, sterile drape, hand hygiene and skin antiseptic. A timeout was performed prior to the initiation of the procedure. Under sterile conditions and local anesthesia, ultrasound micropuncture access performed the right internal jugular vein. Images obtained for documentation of the patent right internal jugular vein. 018 guidewire advanced to the IVC. Micro dilator set advanced. Over an Amplatz guidewire, a 16 cm Mahurkar type temporary dialysis catheter was advanced. Tip position in the SVC RA junction. Guidewire removed. Blood aspirated easily followed by  saline and heparin  flushes. Appropriate volume strength of heparin  instilled in all lumens followed by external caps. Catheter secured with retention sutures and a sterile dressing. No immediate complication. Patient tolerated the procedure well. IMPRESSION: Successful ultrasound fluoroscopic right IJ temporary dialysis type catheter. Ready for use. Electronically Signed   By: CHRISTELLA.  Shick M.D.   On: 03/22/2024 12:58   IR US  Guide Vasc Access Right Result Date: 03/22/2024 INDICATION: [id number] Acute kidney injury (HCC) [id number] EXAM: ULTRASOUND GUIDANCE FOR VASCULAR ACCESS RIGHT IJ TEMPORARY DIALYSIS TYPE CATHETER MEDICATIONS: 1% lidocaine  local ANESTHESIA/SEDATION: None. FLUOROSCOPY: Radiation Exposure Index (as provided by the fluoroscopic device): 2.0 mGy Kerma COMPLICATIONS: None immediate. PROCEDURE: Informed written consent was obtained from the patient after a thorough discussion of the procedural risks, benefits and alternatives. All questions were addressed. Maximal Sterile Barrier Technique was utilized including caps, mask, sterile gowns, sterile gloves, sterile drape, hand hygiene and skin antiseptic. A timeout was performed prior to the initiation of the procedure. Under sterile conditions and local  anesthesia, ultrasound micropuncture access performed the right internal jugular vein. Images obtained for documentation of the patent right internal jugular vein. 018 guidewire advanced to the IVC. Micro dilator set advanced. Over an Amplatz guidewire, a 16 cm Mahurkar type temporary dialysis catheter was advanced. Tip position in the SVC RA junction. Guidewire removed. Blood aspirated easily followed by saline and heparin  flushes. Appropriate volume strength of heparin  instilled in all lumens followed by external caps. Catheter secured with retention sutures and a sterile dressing. No immediate complication. Patient tolerated the procedure well. IMPRESSION: Successful ultrasound fluoroscopic right IJ temporary  dialysis type catheter. Ready for use. Electronically Signed   By: CHRISTELLA.  Shick M.D.   On: 03/22/2024 12:58   DG Abd 1 View Result Date: 03/21/2024 CLINICAL DATA:  Nausea and vomiting. EXAM: ABDOMEN - 1 VIEW COMPARISON:  None Available. FINDINGS: The bowel gas pattern is normal. A large stool burden is seen throughout the colon. No radio-opaque calculi or other significant radiographic abnormality are seen. IMPRESSION: Negative. Electronically Signed   By: Suzen Dials M.D.   On: 03/21/2024 13:24    Medications: Infusions:  cefTRIAXone  (ROCEPHIN )  IV 2 g (03/22/24 1342)   sodium bicarbonate  75 mEq in sodium chloride  0.45 % 1,075 mL infusion 75 mL/hr at 03/23/24 0326    Scheduled Medications:  Chlorhexidine  Gluconate Cloth  6 each Topical Q0600   hydrALAZINE   50 mg Oral Q8H   insulin  aspart  0-15 Units Subcutaneous TID WC   insulin  aspart  0-5 Units Subcutaneous QHS   labetalol   100 mg Oral BID   megestrol   40 mg Oral Daily   pantoprazole  (PROTONIX ) IV  40 mg Intravenous Q12H   polyethylene glycol  17 g Oral BID   rosuvastatin   10 mg Oral Daily   senna  1 tablet Oral Daily   vitamin B-12  500 mcg Oral Daily    have reviewed scheduled and prn medications.  Physical Exam: General: Appears ill, sitting beside bed Heart: Regular rate Lungs: Clear to auscultation bilaterally Abdomen: Soft, nontender Extremities: No lower extremity edema Dialysis Access: Right IJ   03/23/2024,8:36 AM  LOS: 5 days

## 2024-03-23 NOTE — Progress Notes (Signed)
   03/23/24 0215  Vitals  Temp 98.7 F (37.1 C)  Temp Source Oral  BP (!) 163/81  MAP (mmHg) (!) 10  BP Location Left Arm  BP Method Automatic  Patient Position (if appropriate) Lying  Pulse Rate (!) 106  Pulse Rate Source Monitor  ECG Heart Rate (!) 106  Resp 13  Oxygen Therapy  SpO2 100 %  O2 Device Nasal Cannula  During Treatment Monitoring  Blood Flow Rate (mL/min) 0 mL/min  Arterial Pressure (mmHg) -64.64 mmHg  Venous Pressure (mmHg) 125.04 mmHg  TMP (mmHg) 4.24 mmHg  Ultrafiltration Rate (mL/min) 376 mL/min  Dialysate Flow Rate (mL/min) 299 ml/min  Dialysate Potassium Concentration 3  Dialysate Calcium  Concentration 2.5  Duration of HD Treatment -hour(s) 2 hour(s)  Cumulative Fluid Removed (mL) per Treatment  50.05  HD Safety Checks Performed Yes  Intra-Hemodialysis Comments Tx completed  Post Treatment  Dialyzer Clearance Lightly streaked  Liters Processed 36  Fluid Removed (mL) 0 mL  Tolerated HD Treatment Yes  Hemodialysis Catheter Right Internal jugular Triple lumen Temporary (Non-Tunneled)  Placement Date/Time: 03/22/24 1206   Placed prior to admission: No  Serial / Lot #: [id number]  Expiration Date: 03/24/28  Time Out: Correct patient;Correct site;Correct procedure  Maximum sterile barrier precautions: Hand hygiene;Cap;Mask;Sterile gown...  Site Condition No complications  Blue Lumen Status Flushed;Heparin  locked  Red Lumen Status Flushed;Heparin  locked  Purple Lumen Status Flushed  Catheter fill solution Heparin  1000 units/ml  Catheter fill volume (Arterial) 1.6 cc  Catheter fill volume (Venous) 1.6  Dressing Type Transparent  Dressing Status Antimicrobial disc/dressing in place  Drainage Description None  Dressing Change Due 03/30/24  Post treatment catheter status Capped and Clamped

## 2024-03-23 NOTE — Progress Notes (Signed)
 PROGRESS NOTE    Michele Morales  FMW:[id number] DOB: 1975/09/12 DOA: 03/18/2024 PCP: Celestia Rosaline SQUIBB, NP    Brief Narrative:  48 year old with history of insulin -dependent diabetes, HTN, HLD, diabetic neuropathy comes to the ED with referral for PCP due to abnormal lab work.  Creatinine noted to be 2.5, hemoglobin 6.5.   Assessment & Plan:  Principal Problem:   Symptomatic anemia Active Problems:   Type 2 diabetes mellitus with hyperglycemia, with long-term current use of insulin  (HCC)   Essential hypertension   Hyperkalemia   Acute kidney injury superimposed on stage 3b chronic kidney disease (HCC)   Acute on chronic anemia, symptomatic: Menorrhagia, related to adenomyosis -Baseline hemoglobin 10.0, admission hemoglobin 6.5.  Likely from heavy menstruation.  Received 2 units of PRBC.  B12, folate are normal.  Iron  studies overall looks okay, will check ferritin.  Hemoglobin now is steady around 8.5.  Recommend outpatient follow-up with GYN -TSH 3.5, pelvic ultrasound, adenomyosis - Prior provider discussed case with Dr. Barbra who recommended Megace  40 mg daily   E. coli urinary tract infection Urine cultures growing E. coli, sensitive to Rocephin .  Continue total 5 days of Rocephin , EOT 7/30   AKI on CKD stage IIIb: Metabolic Acidosis, Hyperkalemia Baseline creatinine around 2.0, around 3.5 this time.  Seen by nephrology.  Temporary HD catheter placed 7/29.  Nephrology team following   Insulin -dependent diabetes: -Sliding scale and Accu-Cheks   Constipation:  -Aggressive bowel regimen.    Essential hypertension, uncontrolled Hydralazine  3 times daily, add labetalol .  Will further adjust as necessary IV as needed   DVT prophylaxis: SCD Code Status: Full code Family Communication: I was at bedside Disposition Plan:  Status is: Inpatient Remains inpatient appropriate because: management of Anemia, UTI, AKI       Subjective: Interpreter ID [id number]  Seen at  bedside reporting of some headache this morning due to elevated blood pressure denies any other complaints   Examination:  General exam: Appears calm and comfortable  Respiratory system: Clear to auscultation. Respiratory effort normal. Cardiovascular system: S1 & S2 heard, RRR. No JVD, murmurs, rubs, gallops or clicks. No pedal edema. Gastrointestinal system: Abdomen is nondistended, soft and nontender. No organomegaly or masses felt. Normal bowel sounds heard. Central nervous system: Alert and oriented. No focal neurological deficits. Extremities: Symmetric 5 x 5 power. Skin: No rashes, lesions or ulcers Psychiatry: Judgement and insight appear normal. Mood & affect appropriate.                Diet Orders (From admission, onward)     Start     Ordered   03/24/24 0001  Diet NPO time specified Except for: Sips with Meds  Diet effective midnight       Question:  Except for  Answer:  Sips with Meds   03/23/24 0822            Objective: Vitals:   03/23/24 0215 03/23/24 0333 03/23/24 0511 03/23/24 0857  BP: (!) 163/81 (!) 157/76 (!) 165/83 (!) 169/85  Pulse: (!) 106 (!) 105 (!) 105 (!) 111  Resp: 13 16 16 15   Temp: 98.7 F (37.1 C) 98.9 F (37.2 C) 98.6 F (37 C) 98.7 F (37.1 C)  TempSrc: Oral Oral Oral Oral  SpO2: 100% 98% 93% 98%  Weight:      Height:        Intake/Output Summary (Last 24 hours) at 03/23/2024 1148 Last data filed at 03/23/2024 0215 Gross per 24 hour  Intake 240 ml  Output 950 ml  Net -710 ml   Filed Weights   03/18/24 1942  Weight: 57.2 kg    Scheduled Meds:  Chlorhexidine  Gluconate Cloth  6 each Topical Q0600   hydrALAZINE   50 mg Oral Q8H   insulin  aspart  0-15 Units Subcutaneous TID WC   insulin  aspart  0-5 Units Subcutaneous QHS   labetalol   100 mg Oral BID   megestrol   40 mg Oral Daily   pantoprazole  (PROTONIX ) IV  40 mg Intravenous Q12H   polyethylene glycol  17 g Oral BID   rosuvastatin   10 mg Oral Daily   senna  1  tablet Oral Daily   vitamin B-12  500 mcg Oral Daily   Continuous Infusions:  cefTRIAXone  (ROCEPHIN )  IV 2 g (03/22/24 1342)   sodium bicarbonate  75 mEq in sodium chloride  0.45 % 1,075 mL infusion 75 mL/hr at 03/23/24 0326    Nutritional status     Body mass index is 25.47 kg/m.  Data Reviewed:   CBC: Recent Labs  Lab 03/18/24 2015 03/19/24 0816 03/20/24 0709 03/21/24 0740 03/22/24 0811 03/23/24 0557  WBC 16.7* 14.3* 16.1* 14.5* 15.2* 14.7*  NEUTROABS 14.0*  --   --   --   --   --   HGB 6.5* 7.8* 9.2* 8.6* 9.0* 8.5*  HCT 20.2* 23.1* 26.9* 24.8* 26.3* 25.3*  MCV 90.2 87.2 86.8 86.1 86.8 86.3  PLT 349 301 271 244 266 229   Basic Metabolic Panel: Recent Labs  Lab 03/18/24 2015 03/19/24 0816 03/20/24 0709 03/21/24 0740 03/22/24 0811  NA 135 134* 137 135 131*  K 5.4* 5.1 5.1 4.4 3.8  CL 113* 112* 113* 103 98  CO2 15* 15* 15* 18* 21*  GLUCOSE 87 110* 137* 140* 180*  BUN 49* 46* 48* 47* 44*  CREATININE 3.52* 3.31* 3.27* 3.85* 3.56*  CALCIUM  8.3* 8.0* 8.3* 7.9* 8.0*   GFR: Estimated Creatinine Clearance: 14.9 mL/min (A) (by C-G formula based on SCr of 3.56 mg/dL (H)). Liver Function Tests: Recent Labs  Lab 03/18/24 2015  AST 13*  ALT 11  ALKPHOS 68  BILITOT 0.5  PROT 6.1*  ALBUMIN 3.0*   No results for input(s): LIPASE, AMYLASE in the last 168 hours. No results for input(s): AMMONIA in the last 168 hours. Coagulation Profile: Recent Labs  Lab 03/23/24 0557  INR 1.1   Cardiac Enzymes: No results for input(s): CKTOTAL, CKMB, CKMBINDEX, TROPONINI in the last 168 hours. BNP (last 3 results) No results for input(s): PROBNP in the last 8760 hours. HbA1C: No results for input(s): HGBA1C in the last 72 hours. CBG: Recent Labs  Lab 03/22/24 1256 03/22/24 1557 03/22/24 2026 03/23/24 0904 03/23/24 1137  GLUCAP 125* 129* 161* 191* 176*   Lipid Profile: No results for input(s): CHOL, HDL, LDLCALC, TRIG, CHOLHDL, LDLDIRECT  in the last 72 hours. Thyroid Function Tests: No results for input(s): TSH, T4TOTAL, FREET4, T3FREE, THYROIDAB in the last 72 hours. Anemia Panel: No results for input(s): VITAMINB12, FOLATE, FERRITIN, TIBC, IRON , RETICCTPCT in the last 72 hours. Sepsis Labs: No results for input(s): PROCALCITON, LATICACIDVEN in the last 168 hours.  Recent Results (from the past 240 hours)  Urine Culture (for pregnant, neutropenic or urologic patients or patients with an indwelling urinary catheter)     Status: Abnormal   Collection Time: 03/19/24 12:47 PM   Specimen: Urine, Clean Catch  Result Value Ref Range Status   Specimen Description URINE, CLEAN CATCH  Final   Special Requests   Final  NONE Performed at [facility], [address] [place], [place] [address]    Culture >=100,000 COLONIES/mL ESCHERICHIA COLI (A)  Final   Report Status 03/22/2024 FINAL  Final   Organism ID, Bacteria ESCHERICHIA COLI (A)  Final      Susceptibility   Escherichia coli - MIC*    AMPICILLIN <=2 SENSITIVE Sensitive     CEFAZOLIN  <=4 SENSITIVE Sensitive     CEFEPIME <=0.12 SENSITIVE Sensitive     CEFTRIAXONE  <=0.25 SENSITIVE Sensitive     CIPROFLOXACIN >=4 RESISTANT Resistant     GENTAMICIN <=1 SENSITIVE Sensitive     IMIPENEM <=0.25 SENSITIVE Sensitive     NITROFURANTOIN <=16 SENSITIVE Sensitive     TRIMETH/SULFA <=20 SENSITIVE Sensitive     AMPICILLIN/SULBACTAM <=2 SENSITIVE Sensitive     PIP/TAZO <=4 SENSITIVE Sensitive ug/mL    * >=100,000 COLONIES/mL ESCHERICHIA COLI         Radiology Studies: IR Fluoro Guide CV Line Right Result Date: 03/22/2024 INDICATION: [id number] Acute kidney injury (HCC) [id number] EXAM: ULTRASOUND GUIDANCE FOR VASCULAR ACCESS RIGHT IJ TEMPORARY DIALYSIS TYPE CATHETER MEDICATIONS: 1% lidocaine  local ANESTHESIA/SEDATION: None. FLUOROSCOPY: Radiation Exposure Index (as provided by the fluoroscopic device): 2.0 mGy Kerma COMPLICATIONS: None immediate.  PROCEDURE: Informed written consent was obtained from the patient after a thorough discussion of the procedural risks, benefits and alternatives. All questions were addressed. Maximal Sterile Barrier Technique was utilized including caps, mask, sterile gowns, sterile gloves, sterile drape, hand hygiene and skin antiseptic. A timeout was performed prior to the initiation of the procedure. Under sterile conditions and local anesthesia, ultrasound micropuncture access performed the right internal jugular vein. Images obtained for documentation of the patent right internal jugular vein. 018 guidewire advanced to the IVC. Micro dilator set advanced. Over an Amplatz guidewire, a 16 cm Mahurkar type temporary dialysis catheter was advanced. Tip position in the SVC RA junction. Guidewire removed. Blood aspirated easily followed by saline and heparin  flushes. Appropriate volume strength of heparin  instilled in all lumens followed by external caps. Catheter secured with retention sutures and a sterile dressing. No immediate complication. Patient tolerated the procedure well. IMPRESSION: Successful ultrasound fluoroscopic right IJ temporary dialysis type catheter. Ready for use. Electronically Signed   By: CHRISTELLA.  Shick M.D.   On: 03/22/2024 12:58   IR US  Guide Vasc Access Right Result Date: 03/22/2024 INDICATION: [id number] Acute kidney injury (HCC) [id number] EXAM: ULTRASOUND GUIDANCE FOR VASCULAR ACCESS RIGHT IJ TEMPORARY DIALYSIS TYPE CATHETER MEDICATIONS: 1% lidocaine  local ANESTHESIA/SEDATION: None. FLUOROSCOPY: Radiation Exposure Index (as provided by the fluoroscopic device): 2.0 mGy Kerma COMPLICATIONS: None immediate. PROCEDURE: Informed written consent was obtained from the patient after a thorough discussion of the procedural risks, benefits and alternatives. All questions were addressed. Maximal Sterile Barrier Technique was utilized including caps, mask, sterile gowns, sterile gloves, sterile drape, hand hygiene and skin  antiseptic. A timeout was performed prior to the initiation of the procedure. Under sterile conditions and local anesthesia, ultrasound micropuncture access performed the right internal jugular vein. Images obtained for documentation of the patent right internal jugular vein. 018 guidewire advanced to the IVC. Micro dilator set advanced. Over an Amplatz guidewire, a 16 cm Mahurkar type temporary dialysis catheter was advanced. Tip position in the SVC RA junction. Guidewire removed. Blood aspirated easily followed by saline and heparin  flushes. Appropriate volume strength of heparin  instilled in all lumens followed by external caps. Catheter secured with retention sutures and a sterile dressing. No immediate complication. Patient tolerated the procedure well. IMPRESSION: Successful  ultrasound fluoroscopic right IJ temporary dialysis type catheter. Ready for use. Electronically Signed   By: CHRISTELLA.  Shick M.D.   On: 03/22/2024 12:58   DG Abd 1 View Result Date: 03/21/2024 CLINICAL DATA:  Nausea and vomiting. EXAM: ABDOMEN - 1 VIEW COMPARISON:  None Available. FINDINGS: The bowel gas pattern is normal. A large stool burden is seen throughout the colon. No radio-opaque calculi or other significant radiographic abnormality are seen. IMPRESSION: Negative. Electronically Signed   By: Suzen Dials M.D.   On: 03/21/2024 13:24           LOS: 5 days   Time spent= 35 mins    Burgess JAYSON Dare, MD [facility]  If 7PM-7AM, please contact night-coverage  03/23/2024, 11:48 AM

## 2024-03-23 NOTE — Plan of Care (Signed)
  Problem: Coping: Goal: Ability to adjust to condition or change in health will improve Outcome: Progressing   Problem: Skin Integrity: Goal: Risk for impaired skin integrity will decrease Outcome: Progressing   Problem: Coping: Goal: Level of anxiety will decrease Outcome: Progressing   Problem: Pain Managment: Goal: General experience of comfort will improve and/or be controlled Outcome: Progressing   Problem: Safety: Goal: Ability to remain free from injury will improve Outcome: Progressing

## 2024-03-23 NOTE — Hospital Course (Addendum)
 Brief Narrative:  48 year old with history of insulin -dependent diabetes, HTN, HLD, diabetic neuropathy comes to the ED with referral for PCP due to abnormal lab work.  Creatinine noted to be 2.5, hemoglobin 6.5.  Upon admission she was given unit PRBC transfusion, hemoglobin stabilized around 8.5.  To help with menstruation/menorrhagia she was started on Megace  per OB recommendations.  Pelvic ultrasound showed adenomyosis. Patient has continued to rise in creatinine therefore eventually temporary HD catheter was placed with plans for renal biopsy.  Antihypertensives were adjusted as mentioned below.   Assessment & Plan:  Principal Problem:   Symptomatic anemia Active Problems:   Type 2 diabetes mellitus with hyperglycemia, with long-term current use of insulin  (HCC)   Essential hypertension   Hyperkalemia   Acute kidney injury superimposed on stage 3b chronic kidney disease (HCC)    AKI on CKD stage IIIb: Metabolic Acidosis, Hyperkalemia Baseline creatinine around 2.0, around 3.5 this time.  Seen by nephrology.  Temporary HD catheter placed 7/29.  Nephrology team following. HD sessions per nephro. Will need tunneled catheter Renal Biopsy: Pending.    Acute on chronic anemia, symptomatic: Menorrhagia, related to adenomyosis -Baseline hemoglobin 10.0, admission hemoglobin 6.5.  Likely from heavy menstruation.  Received 2 units of PRBC.  B12, folate are normal.  Iron  studies overall looks okay, low ferritin.  Hemoglobin now is steady around 8.5.  1 dose of IV iron .   -TSH 3.5, pelvic ultrasound, adenomyosis - Prior provider discussed case with Dr. Barbra who recommended Megace  40 mg daily, Recommend outpatient follow-up with GYN   E. coli urinary tract infection Completed 5 days of Rocephin  7/30    Insulin -dependent diabetes: -Sliding scale and Accu-Cheks   Constipation:  -Aggressive bowel regimen.    Essential hypertension, uncontrolled Improved.  Continue hydralazine  50 mg  every 8 hours, labetalol  200 mg twice daily.  Will continue to adjust as needed.   DVT prophylaxis: SCD Code Status: Full code Family Communication:FAMILY bedside Disposition Plan:  Status is: Inpatient Remains inpatient appropriate because: management of Anemia, UTI, AKI  Will discharge once cleared by nephrology, anticipating early next week    Subjective: Seen in HD, no complaints.   Examination:  General exam: Appears calm and comfortable  Respiratory system: Clear to auscultation. Respiratory effort normal. Cardiovascular system: S1 & S2 heard, RRR. No JVD, murmurs, rubs, gallops or clicks. No pedal edema. Gastrointestinal system: Abdomen is nondistended, soft and nontender. No organomegaly or masses felt. Normal bowel sounds heard. Central nervous system: Alert and oriented. No focal neurological deficits. Extremities: Symmetric 5 x 5 power. Skin: No rashes, lesions or ulcers Psychiatry: Judgement and insight appear normal. Mood & affect appropriate.

## 2024-03-23 NOTE — Progress Notes (Signed)
 [employer] interpreter, Spanish interpreter, Raquel [phone] to administer medication and collect urine sample. Address pt's needs per interpreter she is not in pain and no nausea currenty.

## 2024-03-24 ENCOUNTER — Inpatient Hospital Stay (HOSPITAL_COMMUNITY): Payer: MEDICAID

## 2024-03-24 LAB — CBC
HCT: 24.3 % — ABNORMAL LOW (ref 36.0–46.0)
Hemoglobin: 8.2 g/dL — ABNORMAL LOW (ref 12.0–15.0)
MCH: 29.3 pg (ref 26.0–34.0)
MCHC: 33.7 g/dL (ref 30.0–36.0)
MCV: 86.8 fL (ref 80.0–100.0)
Platelets: 213 K/uL (ref 150–400)
RBC: 2.8 MIL/uL — ABNORMAL LOW (ref 3.87–5.11)
RDW: 13.7 % (ref 11.5–15.5)
WBC: 15.2 K/uL — ABNORMAL HIGH (ref 4.0–10.5)
nRBC: 0 % (ref 0.0–0.2)

## 2024-03-24 LAB — BASIC METABOLIC PANEL WITH GFR
Anion gap: 11 (ref 5–15)
BUN: 27 mg/dL — ABNORMAL HIGH (ref 6–20)
CO2: 23 mmol/L (ref 22–32)
Calcium: 7.7 mg/dL — ABNORMAL LOW (ref 8.9–10.3)
Chloride: 97 mmol/L — ABNORMAL LOW (ref 98–111)
Creatinine, Ser: 3.25 mg/dL — ABNORMAL HIGH (ref 0.44–1.00)
GFR, Estimated: 17 mL/min — ABNORMAL LOW (ref >60)
Glucose, Bld: 197 mg/dL — ABNORMAL HIGH (ref 70–99)
Potassium: 4.2 mmol/L (ref 3.5–5.1)
Sodium: 131 mmol/L — ABNORMAL LOW (ref 135–145)

## 2024-03-24 LAB — MAGNESIUM: Magnesium: 1.9 mg/dL (ref 1.7–2.4)

## 2024-03-24 LAB — GLUCOSE, CAPILLARY
Glucose-Capillary: 206 mg/dL — ABNORMAL HIGH (ref 70–99)
Glucose-Capillary: 150 mg/dL — ABNORMAL HIGH (ref 70–99)
Glucose-Capillary: 208 mg/dL — ABNORMAL HIGH (ref 70–99)
Glucose-Capillary: 160 mg/dL — ABNORMAL HIGH (ref 70–99)

## 2024-03-24 LAB — PHOSPHORUS: Phosphorus: 4.6 mg/dL (ref 2.5–4.6)

## 2024-03-24 MED ORDER — MIDAZOLAM HCL 2 MG/2ML IJ SOLN
INTRAMUSCULAR | Status: AC | PRN
  Administered 2024-03-24: 1 mg via INTRAVENOUS

## 2024-03-24 MED ORDER — GELATIN ABSORBABLE 12-7 MM EX MISC
1.0000 | Freq: Once | CUTANEOUS | Status: AC
  Administered 2024-03-24: 1 via TOPICAL

## 2024-03-24 MED ORDER — LABETALOL HCL 200 MG PO TABS
200.0000 mg | ORAL_TABLET | Freq: Two times a day (BID) | ORAL | Status: DC
  Administered 2024-03-24 – 2024-03-30 (×12): 200 mg via ORAL
  Filled 2024-03-24 (×16): qty 1

## 2024-03-24 MED ORDER — INSULIN ASPART 100 UNIT/ML IJ SOLN
0.0000 [IU] | Freq: Three times a day (TID) | INTRAMUSCULAR | Status: DC
  Administered 2024-03-24: 3 [IU] via SUBCUTANEOUS
  Administered 2024-03-25 (×2): 2 [IU] via SUBCUTANEOUS
  Administered 2024-03-26: 1 [IU] via SUBCUTANEOUS
  Administered 2024-03-26: 3 [IU] via SUBCUTANEOUS
  Administered 2024-03-27 (×2): 1 [IU] via SUBCUTANEOUS
  Administered 2024-03-27: 2 [IU] via SUBCUTANEOUS
  Administered 2024-03-28: 3 [IU] via SUBCUTANEOUS
  Administered 2024-03-28: 1 [IU] via SUBCUTANEOUS
  Administered 2024-03-29: 3 [IU] via SUBCUTANEOUS
  Administered 2024-03-29 (×2): 2 [IU] via SUBCUTANEOUS
  Administered 2024-03-31: 5 [IU] via SUBCUTANEOUS

## 2024-03-24 MED ORDER — NEPRO/CARBSTEADY PO LIQD
237.0000 mL | Freq: Two times a day (BID) | ORAL | Status: DC
  Administered 2024-03-25 – 2024-03-29 (×8): 237 mL via ORAL

## 2024-03-24 MED ORDER — LIDOCAINE HCL (PF) 1 % IJ SOLN
10.0000 mL | Freq: Once | INTRAMUSCULAR | Status: AC
  Administered 2024-03-24: 10 mL via INTRADERMAL

## 2024-03-24 MED ORDER — IRON SUCROSE 400 MG IVPB - SIMPLE MED
400.0000 mg | Freq: Once | Status: AC
  Administered 2024-03-24: 400 mg via INTRAVENOUS
  Filled 2024-03-24: qty 400

## 2024-03-24 MED ORDER — FENTANYL CITRATE (PF) 100 MCG/2ML IJ SOLN
INTRAMUSCULAR | Status: AC
Start: 2024-03-24 — End: 2024-03-24
  Filled 2024-03-24: qty 2

## 2024-03-24 MED ORDER — INSULIN GLARGINE-YFGN 100 UNIT/ML ~~LOC~~ SOLN
10.0000 [IU] | Freq: Every day | SUBCUTANEOUS | Status: DC
  Administered 2024-03-24: 10 [IU] via SUBCUTANEOUS
  Filled 2024-03-24 (×2): qty 0.1

## 2024-03-24 MED ORDER — MIDAZOLAM HCL 2 MG/2ML IJ SOLN
INTRAMUSCULAR | Status: AC
Start: 2024-03-24 — End: 2024-03-24
  Filled 2024-03-24: qty 2

## 2024-03-24 MED ORDER — FENTANYL CITRATE (PF) 100 MCG/2ML IJ SOLN
INTRAMUSCULAR | Status: AC | PRN
  Administered 2024-03-24: 25 ug via INTRAVENOUS
  Administered 2024-03-24: 50 ug via INTRAVENOUS

## 2024-03-24 NOTE — Procedures (Signed)
 Interventional Radiology Procedure Note  Procedure: Ultrasound guided right kidney biopsy  Findings: Please refer to procedural dictation for full description. 18 ga core x2 from right inferior pole. Gelfoam slurry needle track embolization.  Complications:  None immediate  Estimated Blood Loss: < 5 mL  Recommendations: Strict 3 hour bedrest. Follow Pathology results.   Ester Sides, MD

## 2024-03-24 NOTE — Progress Notes (Signed)
 PROGRESS NOTE    Michele Morales  FMW:[id number] DOB: Oct 17, 1975 DOA: 03/18/2024 PCP: Celestia Rosaline SQUIBB, NP    Brief Narrative:  48 year old with history of insulin -dependent diabetes, HTN, HLD, diabetic neuropathy comes to the ED with referral for PCP due to abnormal lab work.  Creatinine noted to be 2.5, hemoglobin 6.5.  Upon admission she was given unit PRBC transfusion, hemoglobin stabilized around 8.5.  To help with menstruation/menorrhagia she was started on Megace  per OB recommendations.  Pelvic ultrasound showed adenomyosis. Patient has continued to rise in creatinine therefore eventually temporary HD catheter was placed with plans for renal biopsy.  Antihypertensives were adjusted as mentioned below.   Assessment & Plan:  Principal Problem:   Symptomatic anemia Active Problems:   Type 2 diabetes mellitus with hyperglycemia, with long-term current use of insulin  (HCC)   Essential hypertension   Hyperkalemia   Acute kidney injury superimposed on stage 3b chronic kidney disease (HCC)   Acute on chronic anemia, symptomatic: Menorrhagia, related to adenomyosis -Baseline hemoglobin 10.0, admission hemoglobin 6.5.  Likely from heavy menstruation.  Received 2 units of PRBC.  B12, folate are normal.  Iron  studies overall looks okay, low ferritin.  Hemoglobin now is steady around 8.5.  1 dose of IV iron .   -TSH 3.5, pelvic ultrasound, adenomyosis - Prior provider discussed case with Dr. Barbra who recommended Megace  40 mg daily, Recommend outpatient follow-up with GYN   E. coli urinary tract infection Urine cultures growing E. coli, sensitive to Rocephin .  Continue total 5 days of Rocephin , EOT 7/30   AKI on CKD stage IIIb: Metabolic Acidosis, Hyperkalemia Baseline creatinine around 2.0, around 3.5 this time.  Seen by nephrology.  Temporary HD catheter placed 7/29.  Nephrology team following   Insulin -dependent diabetes: -Sliding scale and Accu-Cheks   Constipation:   -Aggressive bowel regimen.    Essential hypertension, uncontrolled Hydralazine  3 times daily, increase labetalol  200 mg twice daily.  Continue to adjust as necessary IV as needed   DVT prophylaxis: SCD Code Status: Full code Family Communication:FAMILY bedside Disposition Plan:  Status is: Inpatient Remains inpatient appropriate because: management of Anemia, UTI, AKI       Subjective: Still having mild headache.  Slowly blood pressure is improving Spouse at bedside Awaiting her renal biopsy Examination:  General exam: Appears calm and comfortable  Respiratory system: Clear to auscultation. Respiratory effort normal. Cardiovascular system: S1 & S2 heard, RRR. No JVD, murmurs, rubs, gallops or clicks. No pedal edema. Gastrointestinal system: Abdomen is nondistended, soft and nontender. No organomegaly or masses felt. Normal bowel sounds heard. Central nervous system: Alert and oriented. No focal neurological deficits. Extremities: Symmetric 5 x 5 power. Skin: No rashes, lesions or ulcers Psychiatry: Judgement and insight appear normal. Mood & affect appropriate.                Diet Orders (From admission, onward)     Start     Ordered   03/24/24 0001  Diet NPO time specified  Diet effective midnight        03/23/24 1209            Objective: Vitals:   03/24/24 0514 03/24/24 0802 03/24/24 1008 03/24/24 1048  BP: (!) 158/82 (!) 155/84 (!) 155/84 136/79  Pulse: (!) 101 (!) 109 (!) 109 92  Resp: 18 18    Temp: 98.1 F (36.7 C) 98.2 F (36.8 C)    TempSrc: Oral     SpO2: 98% 99%    Weight:  Height:        Intake/Output Summary (Last 24 hours) at 03/24/2024 1131 Last data filed at 03/24/2024 0410 Gross per 24 hour  Intake 680 ml  Output 300 ml  Net 380 ml   Filed Weights   03/18/24 1942  Weight: 57.2 kg    Scheduled Meds:  Chlorhexidine  Gluconate Cloth  6 each Topical Q0600   hydrALAZINE   50 mg Oral Q8H   insulin  glargine-yfgn  10  Units Subcutaneous Daily   labetalol   200 mg Oral BID   megestrol   40 mg Oral Daily   pantoprazole  (PROTONIX ) IV  40 mg Intravenous Q12H   polyethylene glycol  17 g Oral BID   rosuvastatin   10 mg Oral Daily   senna  1 tablet Oral Daily   vitamin B-12  500 mcg Oral Daily   Continuous Infusions:  iron  sucrose      Nutritional status     Body mass index is 25.47 kg/m.  Data Reviewed:   CBC: Recent Labs  Lab 03/18/24 2015 03/19/24 0816 03/20/24 0709 03/21/24 0740 03/22/24 0811 03/23/24 0557 03/24/24 0641  WBC 16.7*   < > 16.1* 14.5* 15.2* 14.7* 15.2*  NEUTROABS 14.0*  --   --   --   --   --   --   HGB 6.5*   < > 9.2* 8.6* 9.0* 8.5* 8.2*  HCT 20.2*   < > 26.9* 24.8* 26.3* 25.3* 24.3*  MCV 90.2   < > 86.8 86.1 86.8 86.3 86.8  PLT 349   < > 271 244 266 229 213   < > = values in this interval not displayed.   Basic Metabolic Panel: Recent Labs  Lab 03/20/24 0709 03/21/24 0740 03/22/24 0811 03/23/24 0557 03/24/24 0641  NA 137 135 131* 133* 131*  K 5.1 4.4 3.8 3.8 4.2  CL 113* 103 98 99 97*  CO2 15* 18* 21* 23 23  GLUCOSE 137* 140* 180* 172* 197*  BUN 48* 47* 44* 23* 27*  CREATININE 3.27* 3.85* 3.56* 2.34* 3.25*  CALCIUM  8.3* 7.9* 8.0* 7.5* 7.7*  MG  --   --   --   --  1.9  PHOS  --   --   --   --  4.6   GFR: Estimated Creatinine Clearance: 16.3 mL/min (A) (by C-G formula based on SCr of 3.25 mg/dL (H)). Liver Function Tests: Recent Labs  Lab 03/18/24 2015  AST 13*  ALT 11  ALKPHOS 68  BILITOT 0.5  PROT 6.1*  ALBUMIN 3.0*   No results for input(s): LIPASE, AMYLASE in the last 168 hours. No results for input(s): AMMONIA in the last 168 hours. Coagulation Profile: Recent Labs  Lab 03/23/24 0557  INR 1.1   Cardiac Enzymes: No results for input(s): CKTOTAL, CKMB, CKMBINDEX, TROPONINI in the last 168 hours. BNP (last 3 results) No results for input(s): PROBNP in the last 8760 hours. HbA1C: No results for input(s): HGBA1C in the  last 72 hours. CBG: Recent Labs  Lab 03/23/24 0904 03/23/24 1137 03/23/24 1716 03/23/24 2055 03/24/24 0802  GLUCAP 191* 176* 232* 178* 206*   Lipid Profile: No results for input(s): CHOL, HDL, LDLCALC, TRIG, CHOLHDL, LDLDIRECT in the last 72 hours. Thyroid Function Tests: No results for input(s): TSH, T4TOTAL, FREET4, T3FREE, THYROIDAB in the last 72 hours. Anemia Panel: Recent Labs    03/23/24 0557  FERRITIN 32   Sepsis Labs: No results for input(s): PROCALCITON, LATICACIDVEN in the last 168 hours.  Recent Results (from the  past 240 hours)  Urine Culture (for pregnant, neutropenic or urologic patients or patients with an indwelling urinary catheter)     Status: Abnormal   Collection Time: 03/19/24 12:47 PM   Specimen: Urine, Clean Catch  Result Value Ref Range Status   Specimen Description URINE, CLEAN CATCH  Final   Special Requests   Final    NONE Performed at [facility], [address] [place], [place] [address]    Culture >=100,000 COLONIES/mL ESCHERICHIA COLI (A)  Final   Report Status 03/22/2024 FINAL  Final   Organism ID, Bacteria ESCHERICHIA COLI (A)  Final      Susceptibility   Escherichia coli - MIC*    AMPICILLIN <=2 SENSITIVE Sensitive     CEFAZOLIN  <=4 SENSITIVE Sensitive     CEFEPIME <=0.12 SENSITIVE Sensitive     CEFTRIAXONE  <=0.25 SENSITIVE Sensitive     CIPROFLOXACIN >=4 RESISTANT Resistant     GENTAMICIN <=1 SENSITIVE Sensitive     IMIPENEM <=0.25 SENSITIVE Sensitive     NITROFURANTOIN <=16 SENSITIVE Sensitive     TRIMETH/SULFA <=20 SENSITIVE Sensitive     AMPICILLIN/SULBACTAM <=2 SENSITIVE Sensitive     PIP/TAZO <=4 SENSITIVE Sensitive ug/mL    * >=100,000 COLONIES/mL ESCHERICHIA COLI         Radiology Studies: IR Fluoro Guide CV Line Right Result Date: 03/22/2024 INDICATION: [id number] Acute kidney injury (HCC) [id number] EXAM: ULTRASOUND GUIDANCE FOR VASCULAR ACCESS RIGHT IJ TEMPORARY DIALYSIS TYPE CATHETER  MEDICATIONS: 1% lidocaine  local ANESTHESIA/SEDATION: None. FLUOROSCOPY: Radiation Exposure Index (as provided by the fluoroscopic device): 2.0 mGy Kerma COMPLICATIONS: None immediate. PROCEDURE: Informed written consent was obtained from the patient after a thorough discussion of the procedural risks, benefits and alternatives. All questions were addressed. Maximal Sterile Barrier Technique was utilized including caps, mask, sterile gowns, sterile gloves, sterile drape, hand hygiene and skin antiseptic. A timeout was performed prior to the initiation of the procedure. Under sterile conditions and local anesthesia, ultrasound micropuncture access performed the right internal jugular vein. Images obtained for documentation of the patent right internal jugular vein. 018 guidewire advanced to the IVC. Micro dilator set advanced. Over an Amplatz guidewire, a 16 cm Mahurkar type temporary dialysis catheter was advanced. Tip position in the SVC RA junction. Guidewire removed. Blood aspirated easily followed by saline and heparin  flushes. Appropriate volume strength of heparin  instilled in all lumens followed by external caps. Catheter secured with retention sutures and a sterile dressing. No immediate complication. Patient tolerated the procedure well. IMPRESSION: Successful ultrasound fluoroscopic right IJ temporary dialysis type catheter. Ready for use. Electronically Signed   By: CHRISTELLA.  Shick M.D.   On: 03/22/2024 12:58   IR US  Guide Vasc Access Right Result Date: 03/22/2024 INDICATION: [id number] Acute kidney injury (HCC) [id number] EXAM: ULTRASOUND GUIDANCE FOR VASCULAR ACCESS RIGHT IJ TEMPORARY DIALYSIS TYPE CATHETER MEDICATIONS: 1% lidocaine  local ANESTHESIA/SEDATION: None. FLUOROSCOPY: Radiation Exposure Index (as provided by the fluoroscopic device): 2.0 mGy Kerma COMPLICATIONS: None immediate. PROCEDURE: Informed written consent was obtained from the patient after a thorough discussion of the procedural risks, benefits  and alternatives. All questions were addressed. Maximal Sterile Barrier Technique was utilized including caps, mask, sterile gowns, sterile gloves, sterile drape, hand hygiene and skin antiseptic. A timeout was performed prior to the initiation of the procedure. Under sterile conditions and local anesthesia, ultrasound micropuncture access performed the right internal jugular vein. Images obtained for documentation of the patent right internal jugular vein. 018 guidewire advanced to the IVC. Micro dilator set advanced. Over  an Amplatz guidewire, a 16 cm Mahurkar type temporary dialysis catheter was advanced. Tip position in the SVC RA junction. Guidewire removed. Blood aspirated easily followed by saline and heparin  flushes. Appropriate volume strength of heparin  instilled in all lumens followed by external caps. Catheter secured with retention sutures and a sterile dressing. No immediate complication. Patient tolerated the procedure well. IMPRESSION: Successful ultrasound fluoroscopic right IJ temporary dialysis type catheter. Ready for use. Electronically Signed   By: CHRISTELLA.  Shick M.D.   On: 03/22/2024 12:58           LOS: 6 days   Time spent= 35 mins    Burgess JAYSON Dare, MD [facility]  If 7PM-7AM, please contact night-coverage  03/24/2024, 11:31 AM

## 2024-03-24 NOTE — Inpatient Diabetes Management (Signed)
 Inpatient Diabetes Program Recommendations  AACE/ADA: New Consensus Statement on Inpatient Glycemic Control (2015)  Target Ranges:  Prepandial:   less than 140 mg/dL      Peak postprandial:   less than 180 mg/dL (1-2 hours)      Critically ill patients:  140 - 180 mg/dL   Lab Results  Component Value Date   GLUCAP 150 (H) 03/24/2024   HGBA1C 7.1 (H) 03/19/2024    Latest Reference Range & Units 03/23/24 11:37 03/23/24 17:16 03/23/24 20:55 03/24/24 08:02 03/24/24 13:17  Glucose-Capillary 70 - 99 mg/dL 823 (H) 767 (H) 821 (H) 206 (H) 150 (H)  (H): Data is abnormally high Review of Glycemic Control  Diabetes history: DM2 Outpatient Diabetes medications: Basaglar  20 units daily, Trulicity  1.5 mg daily Current orders for Inpatient glycemic control: Semglee  10 units daily  Inpatient Diabetes Program Recommendations:   Noted that patient is only receiving Semglee .   Recommend adding Novolog  0-6 units correction scale TID, Novolog  0-5 units HS scale while in the hospital.   Marjorie Lunger RN BSN CDE Diabetes Coordinator Pager: [phone]  8am-5pm

## 2024-03-24 NOTE — Plan of Care (Signed)
  Problem: Coping: Goal: Ability to adjust to condition or change in health will improve Outcome: Progressing   Problem: Pain Managment: Goal: General experience of comfort will improve and/or be controlled Outcome: Progressing   Problem: Safety: Goal: Ability to remain free from injury will improve Outcome: Progressing

## 2024-03-24 NOTE — Plan of Care (Signed)
  Problem: Coping: Goal: Ability to adjust to condition or change in health will improve Outcome: Progressing   Problem: Fluid Volume: Goal: Ability to maintain a balanced intake and output will improve Outcome: Progressing   Problem: Health Behavior/Discharge Planning: Goal: Ability to identify and utilize available resources and services will improve Outcome: Progressing Goal: Ability to manage health-related needs will improve Outcome: Progressing   Problem: Nutritional: Goal: Maintenance of adequate nutrition will improve Outcome: Progressing

## 2024-03-24 NOTE — Progress Notes (Signed)
 Pt's case discussed with nephrologist yesterday. Will assist should pt require out-pt HD at d/c.   Randine Mungo Dialysis Navigator [phone]

## 2024-03-24 NOTE — Progress Notes (Addendum)
 Subjective:    Patient is evaluated at bedside, reports nausea and intermittent episodes of emesis. Denies any urinary symptoms.   Objective Vital signs in last 24 hours: Vitals:   03/23/24 2056 03/23/24 2255 03/24/24 0514 03/24/24 0802  BP: (!) 156/85 (!) 156/85 (!) 158/82 (!) 155/84  Pulse: (!) 108  (!) 101 (!) 109  Resp: 18  18 18   Temp: 98 F (36.7 C)  98.1 F (36.7 C) 98.2 F (36.8 C)  TempSrc: Oral  Oral   SpO2: 99%  98% 99%  Weight:      Height:       Weight change:   Intake/Output Summary (Last 24 hours) at 03/24/2024 0931 Last data filed at 03/24/2024 0410 Gross per 24 hour  Intake 680 ml  Output 300 ml  Net 380 ml    Pt is a 48 y.o. yo female who was admitted on 03/18/2024 with symptomatic anemia. Past medical history of insulin -dependent T2DM w/ hyperglycemia, hypertension, hyperlipidemia, diabetic neuropathy presenting to ED 7/25 after referred by her PCP due to abnormal lab work noting Scr 2.5 and Hgb 6.5 checked on 7/23. In the ED, labs were pertinent for Hgb 6.5, WBC 16.7, K 5.4, bicarb 15, Scr 3.52, BUN 49. FOBT negative. UA noted for proteinuria, large leukocytes and rare bacteria. Urine culture +E. Coli. Concern about rapid progression of kidney disease, s/p temp cath placement and initiation of HD. Will plan for renal biopsy.   Assessment/Plan: ESRD s/p R internal jugular Temp Cath for HD  Prior CKD stage IIIb, A3  Presented with Scr 3.52, baseline ~2.04 noted 08/2023. Last urine ACR 2,424 checked on 05/2023. She also notes ongoing daily nausea/vomiting with decrease PO intake for 2 months already, decrease urine frequency. US  renal noted for minimal fullness of L collecting system related to distended bladder, no obstruction or abscess noted. Concern about rapid progression of kidney disease, as Scr 0.72 noted 2/24, then Scr 1.62 10/24. Concerned about uremia with underlying renal failure. S/p Right internal jugular temporary HD cath 7/29; will eventually need  conversion to a tunneled catheter.  Temporary place because of elevated white count. Plan for renal biopsy to evaluate for etiology of the rapid progression of CKD.    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 Urine culture +E. Coli.  - Management per primary    Hypertension Hold home lisinopril  20 mg and hydrochlorothiazide  25 mg. Blood pressures controlled.  - Patient is currently on Hydralazine  50 q8h and labetalol  200 BID.   Labs: Basic Metabolic Panel: Recent Labs  Lab 03/22/24 0811 03/23/24 0557 03/24/24 0641  NA 131* 133* 131*  K 3.8 3.8 4.2  CL 98 99 97*  CO2 21* 23 23  GLUCOSE 180* 172* 197*  BUN 44* 23* 27*  CREATININE 3.56* 2.34* 3.25*  CALCIUM  8.0* 7.5* 7.7*  PHOS  --   --  4.6   Liver Function Tests: Recent Labs  Lab 03/18/24 2015  AST 13*  ALT 11  ALKPHOS 68  BILITOT 0.5  PROT 6.1*  ALBUMIN 3.0*   No results for input(s): LIPASE, AMYLASE in  the last 168 hours. No results for input(s): AMMONIA in the last 168 hours. CBC: Recent Labs  Lab 03/18/24 2015 03/19/24 0816 03/20/24 0709 03/21/24 0740 03/22/24 0811 03/23/24 0557 03/24/24 0641  WBC 16.7*   < > 16.1* 14.5* 15.2* 14.7* 15.2*  NEUTROABS 14.0*  --   --   --   --   --   --   HGB 6.5*   < > 9.2* 8.6* 9.0* 8.5* 8.2*  HCT 20.2*   < > 26.9* 24.8* 26.3* 25.3* 24.3*  MCV 90.2   < > 86.8 86.1 86.8 86.3 86.8  PLT 349   < > 271 244 266 229 213   < > = values in this interval not displayed.   Cardiac Enzymes: No results for input(s): CKTOTAL, CKMB, CKMBINDEX, TROPONINI in the last 168 hours. CBG: Recent Labs  Lab  03/23/24 0904 03/23/24 1137 03/23/24 1716 03/23/24 2055 03/24/24 0802  GLUCAP 191* 176* 232* 178* 206*    Iron  Studies:  Recent Labs    03/23/24 0557  FERRITIN 32   Studies/Results: IR Fluoro Guide CV Line Right Result Date: 03/22/2024 INDICATION: [id number] Acute kidney injury (HCC) [id number] EXAM: ULTRASOUND GUIDANCE FOR VASCULAR ACCESS RIGHT IJ TEMPORARY DIALYSIS TYPE CATHETER MEDICATIONS: 1% lidocaine  local ANESTHESIA/SEDATION: None. FLUOROSCOPY: Radiation Exposure Index (as provided by the fluoroscopic device): 2.0 mGy Kerma COMPLICATIONS: None immediate. PROCEDURE: Informed written consent was obtained from the patient after a thorough discussion of the procedural risks, benefits and alternatives. All questions were addressed. Maximal Sterile Barrier Technique was utilized including caps, mask, sterile gowns, sterile gloves, sterile drape, hand hygiene and skin antiseptic. A timeout was performed prior to the initiation of the procedure. Under sterile conditions and local anesthesia, ultrasound micropuncture access performed the right internal jugular vein. Images obtained for documentation of the patent right internal jugular vein. 018 guidewire advanced to the IVC. Micro dilator set advanced. Over an Amplatz guidewire, a 16 cm Mahurkar type temporary dialysis catheter was advanced. Tip position in the SVC RA junction. Guidewire removed. Blood aspirated easily followed by saline and heparin  flushes. Appropriate volume strength of heparin  instilled in all lumens followed by external caps. Catheter secured with retention sutures and a sterile dressing. No immediate complication. Patient tolerated the procedure well. IMPRESSION: Successful ultrasound fluoroscopic right IJ temporary dialysis type catheter. Ready for use. Electronically Signed   By: CHRISTELLA.  Shick M.D.   On: 03/22/2024 12:58   IR US  Guide Vasc Access Right Result Date: 03/22/2024 INDICATION: [id number] Acute kidney injury (HCC) [id number] EXAM:  ULTRASOUND GUIDANCE FOR VASCULAR ACCESS RIGHT IJ TEMPORARY DIALYSIS TYPE CATHETER MEDICATIONS: 1% lidocaine  local ANESTHESIA/SEDATION: None. FLUOROSCOPY: Radiation Exposure Index (as provided by the fluoroscopic device): 2.0 mGy Kerma COMPLICATIONS: None immediate. PROCEDURE: Informed written consent was obtained from the patient after a thorough discussion of the procedural risks, benefits and alternatives. All questions were addressed. Maximal Sterile Barrier Technique was utilized including caps, mask, sterile gowns, sterile gloves, sterile drape, hand hygiene and skin antiseptic. A timeout was performed prior to the initiation of the procedure. Under sterile conditions and local anesthesia, ultrasound micropuncture access performed the right internal jugular vein. Images obtained for documentation of the patent right internal jugular vein. 018 guidewire advanced to the IVC. Micro dilator set advanced. Over an Amplatz guidewire, a 16 cm Mahurkar type temporary dialysis catheter was advanced. Tip position in the SVC RA junction. Guidewire removed. Blood aspirated easily followed by saline and heparin  flushes. Appropriate volume strength of heparin  instilled in all lumens followed  by external caps. Catheter secured with retention sutures and a sterile dressing. No immediate complication. Patient tolerated the procedure well. IMPRESSION: Successful ultrasound fluoroscopic right IJ temporary dialysis type catheter. Ready for use. Electronically Signed   By: CHRISTELLA.  Shick M.D.   On: 03/22/2024 12:58    Medications: Infusions:  iron  sucrose      Scheduled Medications:  Chlorhexidine  Gluconate Cloth  6 each Topical Q0600   hydrALAZINE   50 mg Oral Q8H   insulin  glargine-yfgn  10 Units Subcutaneous Daily   labetalol   200 mg Oral BID   megestrol   40 mg Oral Daily   pantoprazole  (PROTONIX ) IV  40 mg Intravenous Q12H   polyethylene glycol  17 g Oral BID   rosuvastatin   10 mg Oral Daily   senna  1 tablet Oral  Daily   vitamin B-12  500 mcg Oral Daily    have reviewed scheduled and prn medications.  Physical Exam: General: Appears ill, laying in bed Heart: Tachycardiac  Lungs: CTAB Abdomen: soft, NT  Extremities: warm, no LE edema B/l  Dialysis Access: R internal jugular temp cath     ------------------------------------------------------------- Toma Edwards, DO Internal Medicine Resident, PGY 2  03/24/2024,9:31 AM  LOS: 6 days

## 2024-03-24 NOTE — Progress Notes (Signed)
 Amiee Han, PA-C requested pt rec'd labetalol  d/t elevated BP in preparation for renal biopsy. Recheck BP 30 mins after administration.   03/24/24 0802  Vitals  Temp 98.2 F (36.8 C)  BP (!) 155/84  MAP (mmHg) 106  BP Location Left Arm  BP Method Automatic  Patient Position (if appropriate) Sitting  Pulse Rate (!) 109  Resp 18  MEWS COLOR  MEWS Score Color Green  Oxygen Therapy  SpO2 99 %  O2 Device Room Air  MEWS Score  MEWS Temp 0  MEWS Systolic 0  MEWS Pulse 1  MEWS RR 0  MEWS LOC 0  MEWS Score 1

## 2024-03-24 NOTE — Progress Notes (Signed)
 Utilized stratus interpreter, spanish interpreter, [place] [phone] to administer medications and address pt's needs.

## 2024-03-24 NOTE — Progress Notes (Signed)
 Pt returned from renal biopsy. Yellow MEWS in procedural area. Pt's stable and WDL. No yellow MEWS protocol necessary notified CN Taylore Hinde, RN  03/24/24 1306  Assess: MEWS Score  Temp 98.1 F (36.7 C)  BP 135/76  MAP (mmHg) 95  Pulse Rate 96  Resp 17  Level of Consciousness Alert  SpO2 97 %  O2 Device Room Air  Assess: MEWS Score  MEWS Temp 0  MEWS Systolic 0  MEWS Pulse 0  MEWS RR 0  MEWS LOC 0  MEWS Score 0  MEWS Score Color Green  Assess: if the MEWS score is Yellow or Red  Were vital signs accurate and taken at a resting state? Yes  Does the patient meet 2 or more of the SIRS criteria? No  Assess: SIRS CRITERIA  SIRS Temperature  0  SIRS Respirations  0  SIRS Pulse 1  SIRS WBC 1  SIRS Score Sum  2

## 2024-03-24 NOTE — Progress Notes (Signed)
 Utilized tonita interpreter, spanish interpreter, Julio # [phone] administer medications and address pt's needs.

## 2024-03-24 NOTE — Progress Notes (Signed)
 Pt NPO but PA instructed to give BP meds d/t elevated BP in order for pt to have procedure. Recheck in 30 mins after adminstration Michele Morales #[phone]. MD at bedside to explain plan of care.

## 2024-03-25 LAB — BASIC METABOLIC PANEL WITH GFR
Anion gap: 11 (ref 5–15)
BUN: 35 mg/dL — ABNORMAL HIGH (ref 6–20)
CO2: 24 mmol/L (ref 22–32)
Calcium: 7.7 mg/dL — ABNORMAL LOW (ref 8.9–10.3)
Chloride: 97 mmol/L — ABNORMAL LOW (ref 98–111)
Creatinine, Ser: 3.83 mg/dL — ABNORMAL HIGH (ref 0.44–1.00)
GFR, Estimated: 14 mL/min — ABNORMAL LOW (ref >60)
Glucose, Bld: 172 mg/dL — ABNORMAL HIGH (ref 70–99)
Potassium: 4.8 mmol/L (ref 3.5–5.1)
Sodium: 132 mmol/L — ABNORMAL LOW (ref 135–145)

## 2024-03-25 LAB — CBC
HCT: 23.5 % — ABNORMAL LOW (ref 36.0–46.0)
Hemoglobin: 7.9 g/dL — ABNORMAL LOW (ref 12.0–15.0)
MCH: 29.5 pg (ref 26.0–34.0)
MCHC: 33.6 g/dL (ref 30.0–36.0)
MCV: 87.7 fL (ref 80.0–100.0)
Platelets: 193 K/uL (ref 150–400)
RBC: 2.68 MIL/uL — ABNORMAL LOW (ref 3.87–5.11)
RDW: 13.6 % (ref 11.5–15.5)
WBC: 15.4 K/uL — ABNORMAL HIGH (ref 4.0–10.5)
nRBC: 0 % (ref 0.0–0.2)

## 2024-03-25 LAB — MAGNESIUM: Magnesium: 2 mg/dL (ref 1.7–2.4)

## 2024-03-25 LAB — RENAL FUNCTION PANEL
Albumin: 2.5 g/dL — ABNORMAL LOW (ref 3.5–5.0)
Anion gap: 8 (ref 5–15)
BUN: 36 mg/dL — ABNORMAL HIGH (ref 6–20)
CO2: 23 mmol/L (ref 22–32)
Calcium: 7.8 mg/dL — ABNORMAL LOW (ref 8.9–10.3)
Chloride: 99 mmol/L (ref 98–111)
Creatinine, Ser: 3.95 mg/dL — ABNORMAL HIGH (ref 0.44–1.00)
GFR, Estimated: 13 mL/min — ABNORMAL LOW (ref >60)
Glucose, Bld: 199 mg/dL — ABNORMAL HIGH (ref 70–99)
Phosphorus: 5.3 mg/dL — ABNORMAL HIGH (ref 2.5–4.6)
Potassium: 4.8 mmol/L (ref 3.5–5.1)
Sodium: 130 mmol/L — ABNORMAL LOW (ref 135–145)

## 2024-03-25 LAB — GLUCOSE, CAPILLARY
Glucose-Capillary: 174 mg/dL — ABNORMAL HIGH (ref 70–99)
Glucose-Capillary: 187 mg/dL — ABNORMAL HIGH (ref 70–99)
Glucose-Capillary: 70 mg/dL (ref 70–99)

## 2024-03-25 LAB — VITAMIN D 25 HYDROXY (VIT D DEFICIENCY, FRACTURES): Vit D, 25-Hydroxy: 19.82 ng/mL — ABNORMAL LOW (ref 30–100)

## 2024-03-25 MED ORDER — HEPARIN SODIUM (PORCINE) 1000 UNIT/ML IJ SOLN
INTRAMUSCULAR | Status: AC
  Filled 2024-03-25: qty 4

## 2024-03-25 MED ORDER — INSULIN GLARGINE-YFGN 100 UNIT/ML ~~LOC~~ SOLN
13.0000 [IU] | Freq: Every day | SUBCUTANEOUS | Status: DC
  Administered 2024-03-25 – 2024-03-31 (×5): 13 [IU] via SUBCUTANEOUS
  Filled 2024-03-25 (×7): qty 0.13

## 2024-03-25 MED ORDER — SODIUM CHLORIDE 0.9 % IV SOLN: 1.0000 g | INTRAVENOUS | Status: DC

## 2024-03-25 MED ORDER — ALTEPLASE 2 MG IJ SOLR: 2.0000 mg | Freq: Once | INTRAMUSCULAR | Status: DC | PRN

## 2024-03-25 MED ORDER — HEPARIN SODIUM (PORCINE) 1000 UNIT/ML DIALYSIS
1000.0000 [IU] | INTRAMUSCULAR | Status: DC | PRN
  Administered 2024-03-25 (×2): 1000 [IU]

## 2024-03-25 MED ORDER — LIDOCAINE HCL (PF) 1 % IJ SOLN
5.0000 mL | INTRAMUSCULAR | Status: DC | PRN
Start: 2024-03-25 — End: 2024-03-25

## 2024-03-25 MED ORDER — LIDOCAINE-PRILOCAINE 2.5-2.5 % EX CREA: 1.0000 | TOPICAL_CREAM | CUTANEOUS | Status: DC | PRN

## 2024-03-25 MED ORDER — PENTAFLUOROPROP-TETRAFLUOROETH EX AERO: 1.0000 | INHALATION_SPRAY | CUTANEOUS | Status: DC | PRN

## 2024-03-25 MED ORDER — ANTICOAGULANT SODIUM CITRATE 4% (200MG/5ML) IV SOLN: 5.0000 mL | Status: DC | PRN

## 2024-03-25 NOTE — Plan of Care (Signed)
  Problem: Fluid Volume: Goal: Ability to maintain a balanced intake and output will improve Outcome: Progressing   Problem: Health Behavior/Discharge Planning: Goal: Ability to manage health-related needs will improve Outcome: Progressing   Problem: Pain Managment: Goal: General experience of comfort will improve and/or be controlled Outcome: Progressing

## 2024-03-25 NOTE — Progress Notes (Signed)
 PROGRESS NOTE    Michele Morales  FMW:[id number] DOB: 1976-02-12 DOA: 03/18/2024 PCP: Celestia Rosaline SQUIBB, NP    Brief Narrative:  48 year old with history of insulin -dependent diabetes, HTN, HLD, diabetic neuropathy comes to the ED with referral for PCP due to abnormal lab work.  Creatinine noted to be 2.5, hemoglobin 6.5.  Upon admission she was given unit PRBC transfusion, hemoglobin stabilized around 8.5.  To help with menstruation/menorrhagia she was started on Megace  per OB recommendations.  Pelvic ultrasound showed adenomyosis. Patient has continued to rise in creatinine therefore eventually temporary HD catheter was placed with plans for renal biopsy.  Antihypertensives were adjusted as mentioned below.   Assessment & Plan:  Principal Problem:   Symptomatic anemia Active Problems:   Type 2 diabetes mellitus with hyperglycemia, with long-term current use of insulin  (HCC)   Essential hypertension   Hyperkalemia   Acute kidney injury superimposed on stage 3b chronic kidney disease (HCC)   Acute on chronic anemia, symptomatic: Menorrhagia, related to adenomyosis -Baseline hemoglobin 10.0, admission hemoglobin 6.5.  Likely from heavy menstruation.  Received 2 units of PRBC.  B12, folate are normal.  Iron  studies overall looks okay, low ferritin.  Hemoglobin now is steady around 8.5.  1 dose of IV iron .   -TSH 3.5, pelvic ultrasound, adenomyosis - Prior provider discussed case with Dr. Barbra who recommended Megace  40 mg daily, Recommend outpatient follow-up with GYN   E. coli urinary tract infection Completed 5 days of Rocephin  7/30   AKI on CKD stage IIIb: Metabolic Acidosis, Hyperkalemia Baseline creatinine around 2.0, around 3.5 this time.  Seen by nephrology.  Temporary HD catheter placed 7/29.  Nephrology team following Will need tunneled catheter   Insulin -dependent diabetes: -Sliding scale and Accu-Cheks   Constipation:  -Aggressive bowel regimen.    Essential  hypertension, uncontrolled Blood pressure is overall improving.  Continue hydralazine  50 mg every 8 hours, labetalol  200 mg twice daily.  Will continue to adjust as needed.   DVT prophylaxis: SCD Code Status: Full code Family Communication:FAMILY bedside Disposition Plan:  Status is: Inpatient Remains inpatient appropriate because: management of Anemia, UTI, AKI  Will discharge once cleared by nephrology, anticipating early next week    Subjective:   87 0224 No complaints sitting up in the recliner.  Feeling well Examination:  General exam: Appears calm and comfortable  Respiratory system: Clear to auscultation. Respiratory effort normal. Cardiovascular system: S1 & S2 heard, RRR. No JVD, murmurs, rubs, gallops or clicks. No pedal edema. Gastrointestinal system: Abdomen is nondistended, soft and nontender. No organomegaly or masses felt. Normal bowel sounds heard. Central nervous system: Alert and oriented. No focal neurological deficits. Extremities: Symmetric 5 x 5 power. Skin: No rashes, lesions or ulcers Psychiatry: Judgement and insight appear normal. Mood & affect appropriate.                Diet Orders (From admission, onward)     Start     Ordered   03/24/24 1336  Diet renal with fluid restriction Fluid restriction: 1200 mL Fluid; Room service appropriate? Yes; Fluid consistency: Thin  Diet effective now       Question Answer Comment  Fluid restriction: 1200 mL Fluid   Room service appropriate? Yes   Fluid consistency: Thin      03/24/24 1335            Objective: Vitals:   03/24/24 2002 03/25/24 0447 03/25/24 0752 03/25/24 0837  BP: 136/69 (!) 154/87 (!) 148/71 (!) 148/71  Pulse: ROLLEN)  101 94 (!) 105 100  Resp: 16 20 16    Temp: 98.7 F (37.1 C) 98.6 F (37 C) (!) 97.4 F (36.3 C)   TempSrc: Oral Oral Oral   SpO2: 95% 96% 99%   Weight:      Height:        Intake/Output Summary (Last 24 hours) at 03/25/2024 1145 Last data filed at  03/25/2024 0500 Gross per 24 hour  Intake 540 ml  Output 900 ml  Net -360 ml   Filed Weights   03/18/24 1942  Weight: 57.2 kg    Scheduled Meds:  Chlorhexidine  Gluconate Cloth  6 each Topical Q0600   feeding supplement (NEPRO CARB STEADY)  237 mL Oral BID BM   hydrALAZINE   50 mg Oral Q8H   insulin  aspart  0-9 Units Subcutaneous TID WC   insulin  glargine-yfgn  13 Units Subcutaneous Daily   labetalol   200 mg Oral BID   megestrol   40 mg Oral Daily   pantoprazole  (PROTONIX ) IV  40 mg Intravenous Q12H   polyethylene glycol  17 g Oral BID   rosuvastatin   10 mg Oral Daily   senna  1 tablet Oral Daily   vitamin B-12  500 mcg Oral Daily   Continuous Infusions:  anticoagulant sodium citrate      Nutritional status     Body mass index is 25.47 kg/m.  Data Reviewed:   CBC: Recent Labs  Lab 03/18/24 2015 03/19/24 0816 03/21/24 0740 03/22/24 9188 03/23/24 0557 03/24/24 0641 03/25/24 0703  WBC 16.7*   < > 14.5* 15.2* 14.7* 15.2* 15.4*  NEUTROABS 14.0*  --   --   --   --   --   --   HGB 6.5*   < > 8.6* 9.0* 8.5* 8.2* 7.9*  HCT 20.2*   < > 24.8* 26.3* 25.3* 24.3* 23.5*  MCV 90.2   < > 86.1 86.8 86.3 86.8 87.7  PLT 349   < > 244 266 229 213 193   < > = values in this interval not displayed.   Basic Metabolic Panel: Recent Labs  Lab 03/21/24 0740 03/22/24 0811 03/23/24 0557 03/24/24 0641 03/25/24 0703  NA 135 131* 133* 131* 132*  K 4.4 3.8 3.8 4.2 4.8  CL 103 98 99 97* 97*  CO2 18* 21* 23 23 24   GLUCOSE 140* 180* 172* 197* 172*  BUN 47* 44* 23* 27* 35*  CREATININE 3.85* 3.56* 2.34* 3.25* 3.83*  CALCIUM  7.9* 8.0* 7.5* 7.7* 7.7*  MG  --   --   --  1.9 2.0  PHOS  --   --   --  4.6  --    GFR: Estimated Creatinine Clearance: 13.8 mL/min (A) (by C-G formula based on SCr of 3.83 mg/dL (H)). Liver Function Tests: Recent Labs  Lab 03/18/24 2015  AST 13*  ALT 11  ALKPHOS 68  BILITOT 0.5  PROT 6.1*  ALBUMIN 3.0*   No results for input(s): LIPASE, AMYLASE in  the last 168 hours. No results for input(s): AMMONIA in the last 168 hours. Coagulation Profile: Recent Labs  Lab 03/23/24 0557  INR 1.1   Cardiac Enzymes: No results for input(s): CKTOTAL, CKMB, CKMBINDEX, TROPONINI in the last 168 hours. BNP (last 3 results) No results for input(s): PROBNP in the last 8760 hours. HbA1C: No results for input(s): HGBA1C in the last 72 hours. CBG: Recent Labs  Lab 03/24/24 0802 03/24/24 1317 03/24/24 1649 03/24/24 2056 03/25/24 0749  GLUCAP 206* 150* 208* 160* 174*  Lipid Profile: No results for input(s): CHOL, HDL, LDLCALC, TRIG, CHOLHDL, LDLDIRECT in the last 72 hours. Thyroid Function Tests: No results for input(s): TSH, T4TOTAL, FREET4, T3FREE, THYROIDAB in the last 72 hours. Anemia Panel: Recent Labs    03/23/24 0557  FERRITIN 32   Sepsis Labs: No results for input(s): PROCALCITON, LATICACIDVEN in the last 168 hours.  Recent Results (from the past 240 hours)  Urine Culture (for pregnant, neutropenic or urologic patients or patients with an indwelling urinary catheter)     Status: Abnormal   Collection Time: 03/19/24 12:47 PM   Specimen: Urine, Clean Catch  Result Value Ref Range Status   Specimen Description URINE, CLEAN CATCH  Final   Special Requests   Final    NONE Performed at [facility], [address], [place], [place] [address]    Culture >=100,000 COLONIES/mL ESCHERICHIA COLI (A)  Final   Report Status 03/22/2024 FINAL  Final   Organism ID, Bacteria ESCHERICHIA COLI (A)  Final      Susceptibility   Escherichia coli - MIC*    AMPICILLIN <=2 SENSITIVE Sensitive     CEFAZOLIN  <=4 SENSITIVE Sensitive     CEFEPIME <=0.12 SENSITIVE Sensitive     CEFTRIAXONE  <=0.25 SENSITIVE Sensitive     CIPROFLOXACIN >=4 RESISTANT Resistant     GENTAMICIN <=1 SENSITIVE Sensitive     IMIPENEM <=0.25 SENSITIVE Sensitive     NITROFURANTOIN <=16 SENSITIVE Sensitive     TRIMETH/SULFA <=20  SENSITIVE Sensitive     AMPICILLIN/SULBACTAM <=2 SENSITIVE Sensitive     PIP/TAZO <=4 SENSITIVE Sensitive ug/mL    * >=100,000 COLONIES/mL ESCHERICHIA COLI         Radiology Studies: US  BIOPSY (KIDNEY) Result Date: 03/24/2024 INDICATION: 48 year old female with history of acute kidney failure. EXAM: ULTRASOUND GUIDED RENAL BIOPSY COMPARISON:  None Available. MEDICATIONS: None. ANESTHESIA/SEDATION: Fentanyl  75 mcg IV; Versed  1 mg IV Total Moderate Sedation time: 10 minutes; The patient was continuously monitored during the procedure by the interventional radiology nurse under my direct supervision. COMPLICATIONS: None immediate. PROCEDURE: Informed written consent was obtained from the patient after a discussion of the risks, benefits and alternatives to treatment. The patient understands and consents the procedure. A timeout was performed prior to the initiation of the procedure. Ultrasound scanning was performed of the bilateral flanks. The inferior pole of the right kidney was selected for biopsy due to location and sonographic window. The procedure was planned. The operative site was prepped and draped in the usual sterile fashion. The overlying soft tissues were anesthetized with 1% lidocaine  with epinephrine . A 17 gauge coaxial introducer needle was advanced into the inferior cortex of the right kidney and 2 core biopsies were obtained with an 18 gauge biopsy device under direct ultrasound guidance. Images were saved for documentation purposes. The biopsy device was removed and Gel-Foam slurry was injected under fluoroscopic guidance while removing the introducer needle. Hemostasis was obtained with manual compression. Post procedural scanning was negative for significant post procedural hemorrhage or additional complication. A dressing was placed. The patient tolerated the procedure well without immediate post procedural complication. IMPRESSION: Technically successful ultrasound guided right renal  biopsy. Ester Sides, MD [facility] Electronically Signed   By: Ester Sides M.D.   On: 03/24/2024 22:51           LOS: 7 days   Time spent= 35 mins    Michele Slates JAYSON Dare, MD [facility]  If 7PM-7AM, please contact night-coverage  03/25/2024, 11:45 AM

## 2024-03-25 NOTE — Progress Notes (Signed)
 48 y.o. yo female admitted 03/18/2024 with symptomatic anemia. PMH IDDM w/ hyperglycemia, HTN, HLD, neuropathy referred for abnormal lab work noting Scr 2.5 and Hgb 6.5 checked on 7/23. In the ED, labs were pertinent for Hgb 6.5, WBC 16.7, K 5.4, bicarb 15, Scr 3.52, BUN 49. FOBT negative. UA noted for proteinuria, large leukocytes and rare bacteria. Urine culture +E. Coli.   Assessment/Plan: ESRD s/p R internal jugular Temp Cath for HD  Prior CKD stage IIIb, A3  Presented with Scr 3.52, baseline ~2.04 noted 08/2023. Last urine ACR 2,424 checked on 05/2023.  Very rapid progression with a creatinine of 0.72 10/15/2022, 1.5 on 07/16/2023, low 2's in early 2025, and now creatinine well into the 3's w/ ongoing daily nausea/vomiting with decrease PO intake for 2 months already, US  renal noted for minimal fullness of L collecting system related to distended bladder, no obstruction or abscess noted. Concern about rapid progression of kidney disease. S/p Right internal jugular temporary HD cath 7/29; will eventually need conversion to a tunneled catheter.  Temporary place because of elevated white count. Plan for renal biopsy to evaluate for etiology of the rapid progression of CKD -> bx on 7/31 and should have results early next week.    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 Urine culture +E. Coli -> start on rocephin  x5d   Hypertension Hold home lisinopril  20 mg and hydrochlorothiazide  25 mg. Blood pressures controlled.  - Patient is currently on Hydralazine  50 q8h and labetalol  200 BID.    Subjective:    Patient is evaluated at bedside, reports some nausea but it is markedly improved from earlier in and prior to the admission. Denies any urinary symptoms.  Urine is clear/yellow, not blood-tinged postbiopsy on Thursday.  Objective Vital signs in last 24 hours: Vitals:   03/24/24 1306 03/24/24 1652 03/24/24 2002 03/25/24 0447  BP: 135/76 124/69 136/69 (!) 154/87  Pulse: 96 97 (!) 101 94  Resp: 17 17 16 20   Temp: 98.1 F (36.7 C) 98.7 F (37.1 C) 98.7 F (37.1 C) 98.6 F (37 C)  TempSrc: Oral  Oral Oral  SpO2: 97% 97% 95% 96%  Weight:      Height:       Weight change:   Intake/Output Summary (Last 24 hours) at 03/25/2024 0749 Last data filed at 03/25/2024 0500 Gross per 24 hour  Intake 540 ml  Output 900 ml  Net -360 ml   Labs: Basic Metabolic Panel: Recent Labs  Lab 03/22/24 0811 03/23/24 0557 03/24/24 0641  NA 131* 133* 131*  K 3.8 3.8 4.2  CL 98 99 97*  CO2 21* 23 23  GLUCOSE 180* 172* 197*  BUN 44* 23* 27*  CREATININE 3.56* 2.34* 3.25*  CALCIUM  8.0* 7.5* 7.7*  PHOS  --   --  4.6   Liver Function Tests: Recent Labs  Lab 03/18/24 2015  AST 13*  ALT 11  ALKPHOS 68  BILITOT 0.5  PROT 6.1*  ALBUMIN 3.0*   No results for input(s): LIPASE, AMYLASE in the last 168  hours. No results for input(s): AMMONIA in the last 168 hours. CBC: Recent Labs  Lab 03/18/24 2015 03/19/24 0816 03/20/24 0709 03/21/24 0740 03/22/24 0811 03/23/24 0557 03/24/24 0641  WBC 16.7*   < > 16.1* 14.5* 15.2* 14.7* 15.2*  NEUTROABS 14.0*  --   --   --   --   --   --   HGB 6.5*   < > 9.2* 8.6* 9.0* 8.5* 8.2*  HCT 20.2*   < > 26.9* 24.8* 26.3* 25.3* 24.3*  MCV 90.2   < > 86.8 86.1 86.8 86.3 86.8  PLT 349   < > 271 244 266 229 213   < > = values in this interval not displayed.   Cardiac Enzymes: No results for input(s): CKTOTAL, CKMB, CKMBINDEX, TROPONINI in the last 168 hours. CBG: Recent Labs  Lab 03/23/24 2055 03/24/24 0802 03/24/24 1317  03/24/24 1649 03/24/24 2056  GLUCAP 178* 206* 150* 208* 160*    Iron  Studies:  Recent Labs    03/23/24 0557  FERRITIN 32   Studies/Results: US  BIOPSY (KIDNEY) Result Date: 03/24/2024 INDICATION: 48 year old female with history of acute kidney failure. EXAM: ULTRASOUND GUIDED RENAL BIOPSY COMPARISON:  None Available. MEDICATIONS: None. ANESTHESIA/SEDATION: Fentanyl  75 mcg IV; Versed  1 mg IV Total Moderate Sedation time: 10 minutes; The patient was continuously monitored during the procedure by the interventional radiology nurse under my direct supervision. COMPLICATIONS: None immediate. PROCEDURE: Informed written consent was obtained from the patient after a discussion of the risks, benefits and alternatives to treatment. The patient understands and consents the procedure. A timeout was performed prior to the initiation of the procedure. Ultrasound scanning was performed of the bilateral flanks. The inferior pole of the right kidney was selected for biopsy due to location and sonographic window. The procedure was planned. The operative site was prepped and draped in the usual sterile fashion. The overlying soft tissues were anesthetized with 1% lidocaine  with epinephrine . A 17 gauge coaxial introducer needle was advanced into the inferior cortex of the right kidney and 2 core biopsies were obtained with an 18 gauge biopsy device under direct ultrasound guidance. Images were saved for documentation purposes. The biopsy device was removed and Gel-Foam slurry was injected under fluoroscopic guidance while removing the introducer needle. Hemostasis was obtained with manual compression. Post procedural scanning was negative for significant post procedural hemorrhage or additional complication. A dressing was placed. The patient tolerated the procedure well without immediate post procedural complication. IMPRESSION: Technically successful ultrasound guided right renal biopsy. Ester Sides, MD [facility] Electronically Signed   By: Ester Sides M.D.   On: 03/24/2024 22:51    Medications: Infusions:    Scheduled Medications:  Chlorhexidine  Gluconate Cloth  6 each Topical Q0600   feeding supplement (NEPRO CARB STEADY)  237 mL Oral BID BM   hydrALAZINE   50 mg Oral Q8H   insulin  aspart  0-9 Units Subcutaneous TID WC   insulin  glargine-yfgn  10 Units Subcutaneous Daily   labetalol   200 mg Oral BID   megestrol   40 mg Oral Daily   pantoprazole  (PROTONIX ) IV  40 mg Intravenous Q12H   polyethylene glycol  17 g Oral BID   rosuvastatin   10 mg Oral Daily   senna  1 tablet Oral Daily   vitamin B-12  500 mcg Oral Daily    have reviewed scheduled and prn medications.  Physical Exam: General: Appears ill, laying in bed Heart: Tachycardiac  Lungs: CTAB Abdomen: soft, NT  Extremities: warm, no LE edema B/l  Dialysis Access: R internal jugular temp cath

## 2024-03-25 NOTE — Procedures (Signed)
 Received patient in bed to unit.  Alert and oriented.  Informed consent signed and in chart.   TX duration: 3hrs  Patient tolerated well.  Transported back to the room  Alert, without acute distress.  Hand-off given to patient's nurse.   Access used: Right Subclavian Hd catheter.  Access issues: NONE.  Total UF removed: 1L Medication(s) given: NONE.  [facility]

## 2024-03-26 LAB — GLUCOSE, CAPILLARY
Glucose-Capillary: 145 mg/dL — ABNORMAL HIGH (ref 70–99)
Glucose-Capillary: 211 mg/dL — ABNORMAL HIGH (ref 70–99)
Glucose-Capillary: 105 mg/dL — ABNORMAL HIGH (ref 70–99)
Glucose-Capillary: 173 mg/dL — ABNORMAL HIGH (ref 70–99)

## 2024-03-26 LAB — BASIC METABOLIC PANEL WITH GFR
Anion gap: 10 (ref 5–15)
BUN: 21 mg/dL — ABNORMAL HIGH (ref 6–20)
CO2: 25 mmol/L (ref 22–32)
Calcium: 7.7 mg/dL — ABNORMAL LOW (ref 8.9–10.3)
Chloride: 97 mmol/L — ABNORMAL LOW (ref 98–111)
Creatinine, Ser: 3.21 mg/dL — ABNORMAL HIGH (ref 0.44–1.00)
GFR, Estimated: 17 mL/min — ABNORMAL LOW (ref >60)
Glucose, Bld: 150 mg/dL — ABNORMAL HIGH (ref 70–99)
Potassium: 4.6 mmol/L (ref 3.5–5.1)
Sodium: 132 mmol/L — ABNORMAL LOW (ref 135–145)

## 2024-03-26 LAB — CBC
HCT: 22.2 % — ABNORMAL LOW (ref 36.0–46.0)
Hemoglobin: 7.5 g/dL — ABNORMAL LOW (ref 12.0–15.0)
MCH: 29.9 pg (ref 26.0–34.0)
MCHC: 33.8 g/dL (ref 30.0–36.0)
MCV: 88.4 fL (ref 80.0–100.0)
Platelets: 199 K/uL (ref 150–400)
RBC: 2.51 MIL/uL — ABNORMAL LOW (ref 3.87–5.11)
RDW: 13.7 % (ref 11.5–15.5)
WBC: 11.7 K/uL — ABNORMAL HIGH (ref 4.0–10.5)
nRBC: 0 % (ref 0.0–0.2)

## 2024-03-26 LAB — MAGNESIUM: Magnesium: 1.8 mg/dL (ref 1.7–2.4)

## 2024-03-26 MED ORDER — CHOLECALCIFEROL 125 MCG (5000 UT) PO CAPS
1.0000 | ORAL_CAPSULE | ORAL | Status: DC
  Filled 2024-03-26: qty 1

## 2024-03-26 NOTE — Plan of Care (Signed)
  Problem: Fluid Volume: Goal: Ability to maintain a balanced intake and output will improve Outcome: Progressing   Problem: Health Behavior/Discharge Planning: Goal: Ability to manage health-related needs will improve Outcome: Progressing   Problem: Education: Goal: Knowledge of General Education information will improve Description: Including pain rating scale, medication(s)/side effects and non-pharmacologic comfort measures Outcome: Progressing

## 2024-03-26 NOTE — Plan of Care (Signed)

## 2024-03-26 NOTE — Progress Notes (Signed)
 48 y.o. yo female admitted 03/18/2024 with symptomatic anemia. PMH IDDM w/ hyperglycemia, HTN, HLD, neuropathy referred for abnormal lab work noting Scr 2.5 and Hgb 6.5 checked on 7/23. In the ED, labs were pertinent for Hgb 6.5, WBC 16.7, K 5.4, bicarb 15, Scr 3.52, BUN 49. FOBT negative. UA noted for proteinuria, large leukocytes and rare bacteria. Urine culture +E. Coli.   Assessment/Plan: ESRD s/p R internal jugular Temp Cath for HD  Prior CKD stage IIIb, A3  Presented with Scr 3.52, baseline ~2.04 noted 08/2023. Last urine ACR 2,424 checked on 05/2023.  Very rapid progression with a creatinine of 0.72 10/15/2022, 1.5 on 07/16/2023, low 2's in early 2025, and now creatinine well into the 3's w/ ongoing daily nausea/vomiting with decrease PO intake for 2 months already, US  renal noted for minimal fullness of L collecting system related to distended bladder, no obstruction or abscess noted. Concern about rapid progression of kidney disease. S/p Right internal jugular temporary HD cath 7/29; will eventually need conversion to a tunneled catheter.  Temporary place because of elevated white count. Plan for renal biopsy to evaluate for etiology of the rapid progression of CKD -> bx on 7/31 and should have results early next week.    - Tolerated  HD #2 on Friday fortunately without any more nausea post treatment, will give the weekend off; HD on Mon and convert to TC sometime next week. Biopsy results should be back early next week + CLIP. Should have a better idea if she is ESRD which is rapid progression over the past year; bx results from [facility] should be back early next week.  WBC trending down. 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 Urine culture +E. Coli -> tx rocephin  x5d   Hypertension Hold home lisinopril  20 mg and hydrochlorothiazide  25 mg. Blood pressures controlled.  - Patient is currently on Hydralazine  50 q8h and labetalol  200 BID.   Subjective:    Patient is evaluated at bedside, reports some nausea but it is markedly improved from earlier in and prior to the admission. Denies any urinary symptoms.  Urine is clear/yellow, not blood-tinged postbiopsy on Thursday.  Objective Vital signs in last 24 hours: Vitals:   03/25/24 1949 03/25/24 2315 03/26/24 0446 03/26/24 0757  BP: (!) 144/72 107/61 130/65 (!) 146/69  Pulse: 95 92 100 96  Resp: 18 16 16 18   Temp: 97.9 F (36.6 C) 98.1 F (36.7 C) 98.9 F (37.2 C) 98.6 F (37 C)  TempSrc: Oral Oral Oral Oral  SpO2: 98% 97% 99% 99%  Weight:      Height:       Weight change:   Intake/Output Summary (Last 24 hours) at 03/26/2024 1019 Last data filed at 03/26/2024 0800 Gross per 24 hour  Intake 236 ml  Output 2350 ml  Net -2114 ml   Labs: Basic Metabolic Panel: Recent Labs  Lab 03/24/24 0641 03/25/24 0703 03/25/24 1158  NA 131* 132* 130*  K 4.2 4.8 4.8  CL 97* 97* 99  CO2 23 24 23   GLUCOSE 197* 172* 199*  BUN 27* 35* 36*  CREATININE 3.25* 3.83* 3.95*  CALCIUM  7.7*  7.7* 7.8*  PHOS 4.6  --  5.3*   Liver Function Tests: Recent Labs  Lab 03/25/24 1158  ALBUMIN  2.5*   No results for input(s): LIPASE, AMYLASE in the last 168 hours. No results for input(s): AMMONIA in the last 168 hours. CBC: Recent Labs  Lab 03/22/24 0811 03/23/24 0557 03/24/24 0641 03/25/24 0703 03/26/24 0743  WBC 15.2* 14.7* 15.2* 15.4* 11.7*  HGB 9.0* 8.5* 8.2* 7.9* 7.5*  HCT 26.3* 25.3* 24.3* 23.5* 22.2*  MCV 86.8 86.3 86.8 87.7 88.4  PLT 266 229 213 193 199   Cardiac Enzymes: No results for input(s): CKTOTAL, CKMB, CKMBINDEX, TROPONINI in the last 168  hours. CBG: Recent Labs  Lab 03/24/24 2056 03/25/24 0749 03/25/24 1206 03/25/24 1949 03/26/24 0754  GLUCAP 160* 174* 187* 70 145*    Iron  Studies:  No results for input(s): IRON , TIBC, TRANSFERRIN, FERRITIN in the last 72 hours.  Studies/Results: US  BIOPSY (KIDNEY) Result Date: 03/24/2024 INDICATION: 48 year old female with history of acute kidney failure. EXAM: ULTRASOUND GUIDED RENAL BIOPSY COMPARISON:  None Available. MEDICATIONS: None. ANESTHESIA/SEDATION: Fentanyl  75 mcg IV; Versed  1 mg IV Total Moderate Sedation time: 10 minutes; The patient was continuously monitored during the procedure by the interventional radiology nurse under my direct supervision. COMPLICATIONS: None immediate. PROCEDURE: Informed written consent was obtained from the patient after a discussion of the risks, benefits and alternatives to treatment. The patient understands and consents the procedure. A timeout was performed prior to the initiation of the procedure. Ultrasound scanning was performed of the bilateral flanks. The inferior pole of the right kidney was selected for biopsy due to location and sonographic window. The procedure was planned. The operative site was prepped and draped in the usual sterile fashion. The overlying soft tissues were anesthetized with 1% lidocaine  with epinephrine . A 17 gauge coaxial introducer needle was advanced into the inferior cortex of the right kidney and 2 core biopsies were obtained with an 18 gauge biopsy device under direct ultrasound guidance. Images were saved for documentation purposes. The biopsy device was removed and Gel-Foam slurry was injected under fluoroscopic guidance while removing the introducer needle. Hemostasis was obtained with manual compression. Post procedural scanning was negative for significant post procedural hemorrhage or additional complication. A dressing was placed. The patient tolerated the procedure well without immediate post procedural  complication. IMPRESSION: Technically successful ultrasound guided right renal biopsy. Ester Sides, MD [facility] Electronically Signed   By: Ester Sides M.D.   On: 03/24/2024 22:51    Medications: Infusions:    Scheduled Medications:  Chlorhexidine  Gluconate Cloth  6 each Topical Q0600   feeding supplement (NEPRO CARB STEADY)  237 mL Oral BID BM   hydrALAZINE   50 mg Oral Q8H   insulin  aspart  0-9 Units Subcutaneous TID WC   insulin  glargine-yfgn  13 Units Subcutaneous Daily   labetalol   200 mg Oral BID   megestrol   40 mg Oral Daily   pantoprazole  (PROTONIX ) IV  40 mg Intravenous Q12H   polyethylene glycol  17 g Oral BID   rosuvastatin   10 mg Oral Daily   senna  1 tablet Oral Daily   vitamin B-12  500 mcg Oral Daily    have reviewed scheduled and prn medications.  Physical Exam: General: Appears better today, sitting in the chair Heart: RRR  Lungs: CTAB Abdomen: soft, NT  Extremities: warm, no LE edema B/l  Dialysis Access: R internal jugular temp cath

## 2024-03-26 NOTE — Progress Notes (Signed)
 PROGRESS NOTE    Michele Morales  FMW:[id number] DOB: 1975-10-03 DOA: 03/18/2024 PCP: Celestia Rosaline SQUIBB, NP    Brief Narrative:  48 year old with history of insulin -dependent diabetes, HTN, HLD, diabetic neuropathy comes to the ED with referral for PCP due to abnormal lab work.  Creatinine noted to be 2.5, hemoglobin 6.5.  Upon admission she was given unit PRBC transfusion, hemoglobin stabilized around 8.5.  To help with menstruation/menorrhagia she was started on Megace  per OB recommendations.  Pelvic ultrasound showed adenomyosis. Patient has continued to rise in creatinine therefore eventually temporary HD catheter was placed with plans for renal biopsy.  Antihypertensives were adjusted as mentioned below.   Assessment & Plan:  Principal Problem:   Symptomatic anemia Active Problems:   Type 2 diabetes mellitus with hyperglycemia, with long-term current use of insulin  (HCC)   Essential hypertension   Hyperkalemia   Acute kidney injury superimposed on stage 3b chronic kidney disease (HCC)    AKI on CKD stage IIIb: Metabolic Acidosis, Hyperkalemia Baseline creatinine around 2.0, around 3.5 this time.  Seen by nephrology.  Temporary HD catheter placed 7/29.  Nephrology team following Will need tunneled catheter    Acute on chronic anemia, symptomatic: Menorrhagia, related to adenomyosis -Baseline hemoglobin 10.0, admission hemoglobin 6.5.  Likely from heavy menstruation.  Received 2 units of PRBC.  B12, folate are normal.  Iron  studies overall looks okay, low ferritin.  Hemoglobin now is steady around 8.5.  1 dose of IV iron .   -TSH 3.5, pelvic ultrasound, adenomyosis - Prior provider discussed case with Dr. Barbra who recommended Megace  40 mg daily, Recommend outpatient follow-up with GYN   E. coli urinary tract infection Completed 5 days of Rocephin  7/30    Insulin -dependent diabetes: -Sliding scale and Accu-Cheks   Constipation:  -Aggressive bowel regimen.     Essential hypertension, uncontrolled Improved.  Continue hydralazine  50 mg every 8 hours, labetalol  200 mg twice daily.  Will continue to adjust as needed.   DVT prophylaxis: SCD Code Status: Full code Family Communication:FAMILY bedside Disposition Plan:  Status is: Inpatient Remains inpatient appropriate because: management of Anemia, UTI, AKI  Will discharge once cleared by nephrology, anticipating early next week    Subjective: Interpreter ID [id number] No complaints today  Examination:  General exam: Appears calm and comfortable  Respiratory system: Clear to auscultation. Respiratory effort normal. Cardiovascular system: S1 & S2 heard, RRR. No JVD, murmurs, rubs, gallops or clicks. No pedal edema. Gastrointestinal system: Abdomen is nondistended, soft and nontender. No organomegaly or masses felt. Normal bowel sounds heard. Central nervous system: Alert and oriented. No focal neurological deficits. Extremities: Symmetric 5 x 5 power. Skin: No rashes, lesions or ulcers Psychiatry: Judgement and insight appear normal. Mood & affect appropriate.                Diet Orders (From admission, onward)     Start     Ordered   03/24/24 1336  Diet renal with fluid restriction Fluid restriction: 1200 mL Fluid; Room service appropriate? Yes; Fluid consistency: Thin  Diet effective now       Question Answer Comment  Fluid restriction: 1200 mL Fluid   Room service appropriate? Yes   Fluid consistency: Thin      03/24/24 1335            Objective: Vitals:   03/25/24 1949 03/25/24 2315 03/26/24 0446 03/26/24 0757  BP: (!) 144/72 107/61 130/65 (!) 146/69  Pulse: 95 92 100 96  Resp: 18 16 16  18  Temp: 97.9 F (36.6 C) 98.1 F (36.7 C) 98.9 F (37.2 C) 98.6 F (37 C)  TempSrc: Oral Oral Oral Oral  SpO2: 98% 97% 99% 99%  Weight:      Height:        Intake/Output Summary (Last 24 hours) at 03/26/2024 1043 Last data filed at 03/26/2024 0800 Gross per 24 hour   Intake 236 ml  Output 2350 ml  Net -2114 ml   Filed Weights   03/18/24 1942 03/25/24 1554 03/25/24 1920  Weight: 57.2 kg 65.8 kg 67.8 kg    Scheduled Meds:  Chlorhexidine  Gluconate Cloth  6 each Topical Q0600   [START ON 03/28/2024] Cholecalciferol   1 capsule Oral Q M,W,F   feeding supplement (NEPRO CARB STEADY)  237 mL Oral BID BM   hydrALAZINE   50 mg Oral Q8H   insulin  aspart  0-9 Units Subcutaneous TID WC   insulin  glargine-yfgn  13 Units Subcutaneous Daily   labetalol   200 mg Oral BID   megestrol   40 mg Oral Daily   pantoprazole  (PROTONIX ) IV  40 mg Intravenous Q12H   polyethylene glycol  17 g Oral BID   rosuvastatin   10 mg Oral Daily   senna  1 tablet Oral Daily   vitamin B-12  500 mcg Oral Daily   Continuous Infusions:  Nutritional status     Body mass index is 30.19 kg/m.  Data Reviewed:   CBC: Recent Labs  Lab 03/22/24 0811 03/23/24 0557 03/24/24 0641 03/25/24 0703 03/26/24 0743  WBC 15.2* 14.7* 15.2* 15.4* 11.7*  HGB 9.0* 8.5* 8.2* 7.9* 7.5*  HCT 26.3* 25.3* 24.3* 23.5* 22.2*  MCV 86.8 86.3 86.8 87.7 88.4  PLT 266 229 213 193 199   Basic Metabolic Panel: Recent Labs  Lab 03/23/24 0557 03/24/24 0641 03/25/24 0703 03/25/24 1158 03/26/24 0743  NA 133* 131* 132* 130* 132*  K 3.8 4.2 4.8 4.8 4.6  CL 99 97* 97* 99 97*  CO2 23 23 24 23 25   GLUCOSE 172* 197* 172* 199* 150*  BUN 23* 27* 35* 36* 21*  CREATININE 2.34* 3.25* 3.83* 3.95* 3.21*  CALCIUM  7.5* 7.7* 7.7* 7.8* 7.7*  MG  --  1.9 2.0  --  1.8  PHOS  --  4.6  --  5.3*  --    GFR: Estimated Creatinine Clearance: 17.9 mL/min (A) (by C-G formula based on SCr of 3.21 mg/dL (H)). Liver Function Tests: Recent Labs  Lab 03/25/24 1158  ALBUMIN  2.5*   No results for input(s): LIPASE, AMYLASE in the last 168 hours. No results for input(s): AMMONIA in the last 168 hours. Coagulation Profile: Recent Labs  Lab 03/23/24 0557  INR 1.1   Cardiac Enzymes: No results for input(s):  CKTOTAL, CKMB, CKMBINDEX, TROPONINI in the last 168 hours. BNP (last 3 results) No results for input(s): PROBNP in the last 8760 hours. HbA1C: No results for input(s): HGBA1C in the last 72 hours. CBG: Recent Labs  Lab 03/24/24 2056 03/25/24 0749 03/25/24 1206 03/25/24 1949 03/26/24 0754  GLUCAP 160* 174* 187* 70 145*   Lipid Profile: No results for input(s): CHOL, HDL, LDLCALC, TRIG, CHOLHDL, LDLDIRECT in the last 72 hours. Thyroid Function Tests: No results for input(s): TSH, T4TOTAL, FREET4, T3FREE, THYROIDAB in the last 72 hours. Anemia Panel: No results for input(s): VITAMINB12, FOLATE, FERRITIN, TIBC, IRON , RETICCTPCT in the last 72 hours. Sepsis Labs: No results for input(s): PROCALCITON, LATICACIDVEN in the last 168 hours.  Recent Results (from the past 240 hours)  Urine Culture (for  pregnant, neutropenic or urologic patients or patients with an indwelling urinary catheter)     Status: Abnormal   Collection Time: 03/19/24 12:47 PM   Specimen: Urine, Clean Catch  Result Value Ref Range Status   Specimen Description URINE, CLEAN CATCH  Final   Special Requests   Final    NONE Performed at [facility], [address] [place], [place] [address]    Culture >=100,000 COLONIES/mL ESCHERICHIA COLI (A)  Final   Report Status 03/22/2024 FINAL  Final   Organism ID, Bacteria ESCHERICHIA COLI (A)  Final      Susceptibility   Escherichia coli - MIC*    AMPICILLIN <=2 SENSITIVE Sensitive     CEFAZOLIN  <=4 SENSITIVE Sensitive     CEFEPIME <=0.12 SENSITIVE Sensitive     CEFTRIAXONE  <=0.25 SENSITIVE Sensitive     CIPROFLOXACIN >=4 RESISTANT Resistant     GENTAMICIN <=1 SENSITIVE Sensitive     IMIPENEM <=0.25 SENSITIVE Sensitive     NITROFURANTOIN <=16 SENSITIVE Sensitive     TRIMETH/SULFA <=20 SENSITIVE Sensitive     AMPICILLIN/SULBACTAM <=2 SENSITIVE Sensitive     PIP/TAZO <=4 SENSITIVE Sensitive ug/mL    * >=100,000  COLONIES/mL ESCHERICHIA COLI         Radiology Studies: US  BIOPSY (KIDNEY) Result Date: 03/24/2024 INDICATION: 48 year old female with history of acute kidney failure. EXAM: ULTRASOUND GUIDED RENAL BIOPSY COMPARISON:  None Available. MEDICATIONS: None. ANESTHESIA/SEDATION: Fentanyl  75 mcg IV; Versed  1 mg IV Total Moderate Sedation time: 10 minutes; The patient was continuously monitored during the procedure by the interventional radiology nurse under my direct supervision. COMPLICATIONS: None immediate. PROCEDURE: Informed written consent was obtained from the patient after a discussion of the risks, benefits and alternatives to treatment. The patient understands and consents the procedure. A timeout was performed prior to the initiation of the procedure. Ultrasound scanning was performed of the bilateral flanks. The inferior pole of the right kidney was selected for biopsy due to location and sonographic window. The procedure was planned. The operative site was prepped and draped in the usual sterile fashion. The overlying soft tissues were anesthetized with 1% lidocaine  with epinephrine . A 17 gauge coaxial introducer needle was advanced into the inferior cortex of the right kidney and 2 core biopsies were obtained with an 18 gauge biopsy device under direct ultrasound guidance. Images were saved for documentation purposes. The biopsy device was removed and Gel-Foam slurry was injected under fluoroscopic guidance while removing the introducer needle. Hemostasis was obtained with manual compression. Post procedural scanning was negative for significant post procedural hemorrhage or additional complication. A dressing was placed. The patient tolerated the procedure well without immediate post procedural complication. IMPRESSION: Technically successful ultrasound guided right renal biopsy. Ester Sides, MD [facility] Electronically Signed   By: Ester Sides M.D.   On: 03/24/2024 22:51           LOS: 8 days   Time spent= 35 mins    Burgess JAYSON Dare, MD [facility]  If 7PM-7AM, please contact night-coverage  03/26/2024, 10:43 AM

## 2024-03-27 LAB — GLUCOSE, CAPILLARY
Glucose-Capillary: 140 mg/dL — ABNORMAL HIGH (ref 70–99)
Glucose-Capillary: 173 mg/dL — ABNORMAL HIGH (ref 70–99)
Glucose-Capillary: 129 mg/dL — ABNORMAL HIGH (ref 70–99)
Glucose-Capillary: 129 mg/dL — ABNORMAL HIGH (ref 70–99)

## 2024-03-27 LAB — CBC
HCT: 23.4 % — ABNORMAL LOW (ref 36.0–46.0)
Hemoglobin: 7.7 g/dL — ABNORMAL LOW (ref 12.0–15.0)
MCH: 29.6 pg (ref 26.0–34.0)
MCHC: 32.9 g/dL (ref 30.0–36.0)
MCV: 90 fL (ref 80.0–100.0)
Platelets: 230 K/uL (ref 150–400)
RBC: 2.6 MIL/uL — ABNORMAL LOW (ref 3.87–5.11)
RDW: 13.7 % (ref 11.5–15.5)
WBC: 14.5 K/uL — ABNORMAL HIGH (ref 4.0–10.5)
nRBC: 0 % (ref 0.0–0.2)

## 2024-03-27 LAB — BASIC METABOLIC PANEL WITH GFR
Anion gap: 10 (ref 5–15)
BUN: 36 mg/dL — ABNORMAL HIGH (ref 6–20)
CO2: 26 mmol/L (ref 22–32)
Calcium: 7.8 mg/dL — ABNORMAL LOW (ref 8.9–10.3)
Chloride: 97 mmol/L — ABNORMAL LOW (ref 98–111)
Creatinine, Ser: 3.91 mg/dL — ABNORMAL HIGH (ref 0.44–1.00)
GFR, Estimated: 14 mL/min — ABNORMAL LOW (ref >60)
Glucose, Bld: 129 mg/dL — ABNORMAL HIGH (ref 70–99)
Potassium: 5.6 mmol/L — ABNORMAL HIGH (ref 3.5–5.1)
Sodium: 133 mmol/L — ABNORMAL LOW (ref 135–145)

## 2024-03-27 LAB — MAGNESIUM: Magnesium: 1.9 mg/dL (ref 1.7–2.4)

## 2024-03-27 MED ORDER — DARBEPOETIN ALFA 100 MCG/0.5ML IJ SOSY
100.0000 ug | PREFILLED_SYRINGE | INTRAMUSCULAR | Status: DC
  Administered 2024-03-27: 100 ug via SUBCUTANEOUS
  Filled 2024-03-27: qty 0.5

## 2024-03-27 MED ORDER — SODIUM ZIRCONIUM CYCLOSILICATE 10 G PO PACK
10.0000 g | PACK | Freq: Two times a day (BID) | ORAL | Status: AC
  Administered 2024-03-27 (×2): 10 g via ORAL
  Filled 2024-03-27 (×2): qty 1

## 2024-03-27 NOTE — Plan of Care (Signed)

## 2024-03-27 NOTE — Plan of Care (Signed)

## 2024-03-27 NOTE — Progress Notes (Signed)
 48 y.o. yo female admitted 03/18/2024 with symptomatic anemia. PMH IDDM w/ hyperglycemia, HTN, HLD, neuropathy referred for abnormal lab work noting Scr 2.5 and Hgb 6.5 checked on 7/23. In the ED, labs were pertinent for Hgb 6.5, WBC 16.7, K 5.4, bicarb 15, Scr 3.52, BUN 49. FOBT negative. UA noted for proteinuria, large leukocytes and rare bacteria. Urine culture +E. Coli.   Assessment/Plan: ESRD s/p R internal jugular Temp Cath for HD  Prior CKD stage IIIb, A3  Presented with Scr 3.52, baseline ~2.04 noted 08/2023. Last urine ACR 2,424 checked on 05/2023.  Very rapid progression with a creatinine of 0.72 10/15/2022, 1.5 on 07/16/2023, low 2's in early 2025, and now creatinine well into the 3's w/ ongoing daily nausea/vomiting with decrease PO intake for 2 months already, US  renal noted for minimal fullness of L collecting system related to distended bladder, no obstruction or abscess noted. Concern about rapid progression of kidney disease. S/p Right internal jugular temporary HD cath 7/29; will eventually need conversion to a tunneled catheter (temp place bec of elevated white count). Plan for renal biopsy to evaluate for etiology of the rapid progression of CKD -> bx on 7/31   - Tolerated  HD #2 on Friday fortunately without any more nausea post treatment, will give the weekend off; HD #3 on Mon and convert to TC mid week if needed.  Biopsy results should be back early next week + CLIP when we have a better idea if she is ESRD (very rapid progression over the past year; bx results from [facility] should be back early this week).  Dietary consult; lokelma  today.  WBC trending down. 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 Urine culture +E. Coli -> tx rocephin  x5d   Hypertension Hold home lisinopril  20 mg and hydrochlorothiazide  25 mg. Blood pressures controlled.  - Patient is currently on Hydralazine  50 q8h and labetalol  200 BID.   Subjective:    Patient is evaluated at bedside, feels much better. No further nausea HD #2 better than the 1st tx.  Denies SOB or any urinary symptoms.  Urine is clear/yellow, not blood-tinged postbiopsy on Thursday.  Objective Vital signs in last 24 hours: Vitals:   03/27/24 0359 03/27/24 0536 03/27/24 0551 03/27/24 0745  BP: (!) 146/73 (!) 146/73  (!) 151/76  Pulse: (!) 102   (!) 101  Resp: 16   18  Temp: 98.7 F (37.1 C)   98.3 F (36.8 C)  TempSrc: Oral   Oral  SpO2: 97%   98%  Weight:   65.1 kg   Height:       Weight change: -0.7 kg  Intake/Output Summary (Last 24 hours) at 03/27/2024 9177 Last data filed at 03/27/2024 9188 Gross per 24 hour  Intake 478 ml  Output 900 ml  Net -422 ml   Labs: Basic Metabolic Panel: Recent Labs  Lab 03/24/24 0641 03/25/24 0703 03/25/24 1158 03/26/24 0743 03/27/24 0640  NA 131*   < > 130* 132* 133*  K 4.2   < > 4.8 4.6 5.6*  CL 97*   < > 99 97* 97*  CO2 23   < >  23 25 26   GLUCOSE 197*   < > 199* 150* 129*  BUN 27*   < > 36* 21* 36*  CREATININE 3.25*   < > 3.95* 3.21* 3.91*  CALCIUM  7.7*   < > 7.8* 7.7* 7.8*  PHOS 4.6  --  5.3*  --   --    < > = values in this interval not displayed.   Liver Function Tests: Recent Labs  Lab 03/25/24 1158  ALBUMIN  2.5*   No results for input(s): LIPASE, AMYLASE in the last 168 hours. No results for input(s): AMMONIA in the last 168 hours. CBC: Recent Labs  Lab 03/23/24 0557 03/24/24 0641 03/25/24 0703 03/26/24 0743 03/27/24 0640  WBC 14.7* 15.2* 15.4* 11.7* 14.5*  HGB 8.5* 8.2* 7.9* 7.5* 7.7*  HCT 25.3* 24.3* 23.5* 22.2* 23.4*  MCV 86.3 86.8 87.7 88.4 90.0  PLT 229 213 193 199 230   Cardiac Enzymes: No  results for input(s): CKTOTAL, CKMB, CKMBINDEX, TROPONINI in the last 168 hours. CBG: Recent Labs  Lab 03/26/24 0754 03/26/24 1214 03/26/24 1712 03/26/24 2107 03/27/24 0742  GLUCAP 145* 211* 105* 173* 140*    Iron  Studies:  No results for input(s): IRON , TIBC, TRANSFERRIN, FERRITIN in the last 72 hours.  Studies/Results: No results found.   Medications: Infusions:    Scheduled Medications:  Chlorhexidine  Gluconate Cloth  6 each Topical Q0600   [START ON 03/28/2024] Cholecalciferol   1 capsule Oral Q M,W,F   feeding supplement (NEPRO CARB STEADY)  237 mL Oral BID BM   hydrALAZINE   50 mg Oral Q8H   insulin  aspart  0-9 Units Subcutaneous TID WC   insulin  glargine-yfgn  13 Units Subcutaneous Daily   labetalol   200 mg Oral BID   megestrol   40 mg Oral Daily   pantoprazole  (PROTONIX ) IV  40 mg Intravenous Q12H   polyethylene glycol  17 g Oral BID   rosuvastatin   10 mg Oral Daily   senna  1 tablet Oral Daily   vitamin B-12  500 mcg Oral Daily    have reviewed scheduled and prn medications.  Physical Exam: General: Appears better today, sitting in the chair Heart: RRR  Lungs: CTAB Abdomen: soft, NT  Extremities: warm, no LE edema B/l  Dialysis Access: R internal jugular temp cath

## 2024-03-27 NOTE — Progress Notes (Signed)
 PROGRESS NOTE    Michele Morales  FMW:[id number] DOB: [birth year] DOA: 03/18/2024 PCP: Celestia Rosaline SQUIBB, NP    Brief Narrative:  48 year old with history of insulin -dependent diabetes, HTN, HLD, diabetic neuropathy comes to the ED with referral for PCP due to abnormal lab work.  Creatinine noted to be 2.5, hemoglobin 6.5.  Upon admission she was given unit PRBC transfusion, hemoglobin stabilized around 8.5.  To help with menstruation/menorrhagia she was started on Megace  per OB recommendations.  Pelvic ultrasound showed adenomyosis. Patient has continued to rise in creatinine therefore eventually temporary HD catheter was placed with plans for renal biopsy.  Antihypertensives were adjusted as mentioned below.   Assessment & Plan:  Principal Problem:   Symptomatic anemia Active Problems:   Type 2 diabetes mellitus with hyperglycemia, with long-term current use of insulin  (HCC)   Essential hypertension   Hyperkalemia   Acute kidney injury superimposed on stage 3b chronic kidney disease (HCC)    AKI on CKD stage IIIb: Metabolic Acidosis, Hyperkalemia Baseline creatinine around 2.0, around 3.5 this time.  Seen by nephrology.  Temporary HD catheter placed 7/29.  Nephrology team following Will need tunneled catheter    Acute on chronic anemia, symptomatic: Menorrhagia, related to adenomyosis -Baseline hemoglobin 10.0, admission hemoglobin 6.5.  Likely from heavy menstruation.  Received 2 units of PRBC.  B12, folate are normal.  Iron  studies overall looks okay, low ferritin.  Hemoglobin now is steady around 8.5.  1 dose of IV iron .   -TSH 3.5, pelvic ultrasound, adenomyosis - Prior provider discussed case with Dr. Barbra who recommended Megace  40 mg daily, Recommend outpatient follow-up with GYN   E. coli urinary tract infection Completed 5 days of Rocephin  7/30    Insulin -dependent diabetes: -Sliding scale and Accu-Cheks   Constipation:  -Aggressive bowel regimen.     Essential hypertension, uncontrolled Improved.  Continue hydralazine  50 mg every 8 hours, labetalol  200 mg twice daily.  Will continue to adjust as needed.   DVT prophylaxis: SCD Code Status: Full code Family Communication:FAMILY bedside Disposition Plan:  Status is: Inpatient Remains inpatient appropriate because: management of Anemia, UTI, AKI  Will discharge once cleared by nephrology, anticipating early next week    Subjective: No complaints doing okay  Examination:  General exam: Appears calm and comfortable  Respiratory system: Clear to auscultation. Respiratory effort normal. Cardiovascular system: S1 & S2 heard, RRR. No JVD, murmurs, rubs, gallops or clicks. No pedal edema. Gastrointestinal system: Abdomen is nondistended, soft and nontender. No organomegaly or masses felt. Normal bowel sounds heard. Central nervous system: Alert and oriented. No focal neurological deficits. Extremities: Symmetric 5 x 5 power. Skin: No rashes, lesions or ulcers Psychiatry: Judgement and insight appear normal. Mood & affect appropriate.                Diet Orders (From admission, onward)     Start     Ordered   03/24/24 1336  Diet renal with fluid restriction Fluid restriction: 1200 mL Fluid; Room service appropriate? Yes; Fluid consistency: Thin  Diet effective now       Question Answer Comment  Fluid restriction: 1200 mL Fluid   Room service appropriate? Yes   Fluid consistency: Thin      03/24/24 1335            Objective: Vitals:   03/27/24 0359 03/27/24 0536 03/27/24 0551 03/27/24 0745  BP: (!) 146/73 (!) 146/73  (!) 151/76  Pulse: (!) 102   (!) 101  Resp: 16  18  Temp: 98.7 F (37.1 C)   98.3 F (36.8 C)  TempSrc: Oral   Oral  SpO2: 97%   98%  Weight:   65.1 kg   Height:        Intake/Output Summary (Last 24 hours) at 03/27/2024 1033 Last data filed at 03/27/2024 0811 Gross per 24 hour  Intake 474 ml  Output 900 ml  Net -426 ml   Filed Weights    03/25/24 1554 03/25/24 1920 03/27/24 0551  Weight: 65.8 kg 67.8 kg 65.1 kg    Scheduled Meds:  Chlorhexidine  Gluconate Cloth  6 each Topical Q0600   [START ON 03/28/2024] Cholecalciferol   1 capsule Oral Q M,W,F   darbepoetin (ARANESP ) injection - DIALYSIS  100 mcg Subcutaneous Q Sun-1800   feeding supplement (NEPRO CARB STEADY)  237 mL Oral BID BM   hydrALAZINE   50 mg Oral Q8H   insulin  aspart  0-9 Units Subcutaneous TID WC   insulin  glargine-yfgn  13 Units Subcutaneous Daily   labetalol   200 mg Oral BID   megestrol   40 mg Oral Daily   pantoprazole  (PROTONIX ) IV  40 mg Intravenous Q12H   polyethylene glycol  17 g Oral BID   rosuvastatin   10 mg Oral Daily   senna  1 tablet Oral Daily   sodium zirconium cyclosilicate   10 g Oral BID   vitamin B-12  500 mcg Oral Daily   Continuous Infusions:  Nutritional status     Body mass index is 28.99 kg/m.  Data Reviewed:   CBC: Recent Labs  Lab 03/23/24 0557 03/24/24 0641 03/25/24 0703 03/26/24 0743 03/27/24 0640  WBC 14.7* 15.2* 15.4* 11.7* 14.5*  HGB 8.5* 8.2* 7.9* 7.5* 7.7*  HCT 25.3* 24.3* 23.5* 22.2* 23.4*  MCV 86.3 86.8 87.7 88.4 90.0  PLT 229 213 193 199 230   Basic Metabolic Panel: Recent Labs  Lab 03/24/24 0641 03/25/24 0703 03/25/24 1158 03/26/24 0743 03/27/24 0640  NA 131* 132* 130* 132* 133*  K 4.2 4.8 4.8 4.6 5.6*  CL 97* 97* 99 97* 97*  CO2 23 24 23 25 26   GLUCOSE 197* 172* 199* 150* 129*  BUN 27* 35* 36* 21* 36*  CREATININE 3.25* 3.83* 3.95* 3.21* 3.91*  CALCIUM  7.7* 7.7* 7.8* 7.7* 7.8*  MG 1.9 2.0  --  1.8 1.9  PHOS 4.6  --  5.3*  --   --    GFR: Estimated Creatinine Clearance: 14.4 mL/min (A) (by C-G formula based on SCr of 3.91 mg/dL (H)). Liver Function Tests: Recent Labs  Lab 03/25/24 1158  ALBUMIN  2.5*   No results for input(s): LIPASE, AMYLASE in the last 168 hours. No results for input(s): AMMONIA in the last 168 hours. Coagulation Profile: Recent Labs  Lab 03/23/24 0557   INR 1.1   Cardiac Enzymes: No results for input(s): CKTOTAL, CKMB, CKMBINDEX, TROPONINI in the last 168 hours. BNP (last 3 results) No results for input(s): PROBNP in the last 8760 hours. HbA1C: No results for input(s): HGBA1C in the last 72 hours. CBG: Recent Labs  Lab 03/26/24 0754 03/26/24 1214 03/26/24 1712 03/26/24 2107 03/27/24 0742  GLUCAP 145* 211* 105* 173* 140*   Lipid Profile: No results for input(s): CHOL, HDL, LDLCALC, TRIG, CHOLHDL, LDLDIRECT in the last 72 hours. Thyroid Function Tests: No results for input(s): TSH, T4TOTAL, FREET4, T3FREE, THYROIDAB in the last 72 hours. Anemia Panel: No results for input(s): VITAMINB12, FOLATE, FERRITIN, TIBC, IRON , RETICCTPCT in the last 72 hours. Sepsis Labs: No results for input(s): PROCALCITON,  LATICACIDVEN in the last 168 hours.  Recent Results (from the past 240 hours)  Urine Culture (for pregnant, neutropenic or urologic patients or patients with an indwelling urinary catheter)     Status: Abnormal   Collection Time: 03/19/24 12:47 PM   Specimen: Urine, Clean Catch  Result Value Ref Range Status   Specimen Description URINE, CLEAN CATCH  Final   Special Requests   Final    NONE Performed at [facility], [address], [place], [place] [address]    Culture >=100,000 COLONIES/mL ESCHERICHIA COLI (A)  Final   Report Status 03/22/2024 FINAL  Final   Organism ID, Bacteria ESCHERICHIA COLI (A)  Final      Susceptibility   Escherichia coli - MIC*    AMPICILLIN <=2 SENSITIVE Sensitive     CEFAZOLIN  <=4 SENSITIVE Sensitive     CEFEPIME <=0.12 SENSITIVE Sensitive     CEFTRIAXONE  <=0.25 SENSITIVE Sensitive     CIPROFLOXACIN >=4 RESISTANT Resistant     GENTAMICIN <=1 SENSITIVE Sensitive     IMIPENEM <=0.25 SENSITIVE Sensitive     NITROFURANTOIN <=16 SENSITIVE Sensitive     TRIMETH/SULFA <=20 SENSITIVE Sensitive     AMPICILLIN/SULBACTAM <=2 SENSITIVE Sensitive      PIP/TAZO <=4 SENSITIVE Sensitive ug/mL    * >=100,000 COLONIES/mL ESCHERICHIA COLI         Radiology Studies: No results found.         LOS: 9 days   Time spent= 35 mins    Burgess JAYSON Dare, MD [facility]  If 7PM-7AM, please contact night-coverage  03/27/2024, 10:33 AM

## 2024-03-28 LAB — RENAL FUNCTION PANEL
Albumin: 2.8 g/dL — ABNORMAL LOW (ref 3.5–5.0)
Anion gap: 10 (ref 5–15)
BUN: 48 mg/dL — ABNORMAL HIGH (ref 6–20)
CO2: 24 mmol/L (ref 22–32)
Calcium: 8.4 mg/dL — ABNORMAL LOW (ref 8.9–10.3)
Chloride: 97 mmol/L — ABNORMAL LOW (ref 98–111)
Creatinine, Ser: 4.13 mg/dL — ABNORMAL HIGH (ref 0.44–1.00)
GFR, Estimated: 13 mL/min — ABNORMAL LOW (ref >60)
Glucose, Bld: 159 mg/dL — ABNORMAL HIGH (ref 70–99)
Phosphorus: 4.6 mg/dL (ref 2.5–4.6)
Potassium: 5.1 mmol/L (ref 3.5–5.1)
Sodium: 131 mmol/L — ABNORMAL LOW (ref 135–145)

## 2024-03-28 LAB — CBC
HCT: 24.7 % — ABNORMAL LOW (ref 36.0–46.0)
Hemoglobin: 7.9 g/dL — ABNORMAL LOW (ref 12.0–15.0)
MCH: 29.2 pg (ref 26.0–34.0)
MCHC: 32 g/dL (ref 30.0–36.0)
MCV: 91.1 fL (ref 80.0–100.0)
Platelets: 241 K/uL (ref 150–400)
RBC: 2.71 MIL/uL — ABNORMAL LOW (ref 3.87–5.11)
RDW: 13.7 % (ref 11.5–15.5)
WBC: 14.7 K/uL — ABNORMAL HIGH (ref 4.0–10.5)
nRBC: 0 % (ref 0.0–0.2)

## 2024-03-28 LAB — GLUCOSE, CAPILLARY
Glucose-Capillary: 123 mg/dL — ABNORMAL HIGH (ref 70–99)
Glucose-Capillary: 219 mg/dL — ABNORMAL HIGH (ref 70–99)
Glucose-Capillary: 178 mg/dL — ABNORMAL HIGH (ref 70–99)

## 2024-03-28 LAB — PARATHYROID HORMONE, INTACT (NO CA): PTH: 101 pg/mL — ABNORMAL HIGH (ref 15–65)

## 2024-03-28 LAB — MAGNESIUM: Magnesium: 2 mg/dL (ref 1.7–2.4)

## 2024-03-28 MED ORDER — VITAMIN D 25 MCG (1000 UNIT) PO TABS
5000.0000 [IU] | ORAL_TABLET | ORAL | Status: DC
  Administered 2024-03-28: 5000 [IU] via ORAL
  Filled 2024-03-28 (×2): qty 5

## 2024-03-28 MED ORDER — HEPARIN SODIUM (PORCINE) 1000 UNIT/ML DIALYSIS: 2000.0000 [IU] | Freq: Once | INTRAMUSCULAR | Status: DC

## 2024-03-28 NOTE — Progress Notes (Signed)
   03/28/24 1116  Vitals  Temp 98.5 F (36.9 C) (Simultaneous filing. User may not have seen previous data.)  Pulse Rate 89  Resp 14  BP (!) 177/81  SpO2 95 %  O2 Device Room Air  Weight 65.5 kg  Type of Weight Post-Dialysis  Oxygen Therapy  Patient Activity (if Appropriate) In bed  Post Treatment  Dialyzer Clearance Clear  Hemodialysis Intake (mL) 0 mL  Liters Processed 54  Fluid Removed (mL) 1000 mL  Tolerated HD Treatment Yes   Received patient in bed to unit.  Alert and oriented.  Informed consent signed and in chart.   TX duration:3hrs  Patient tolerated well.  Transported back to the room  Alert, without acute distress.  Hand-off given to patient's nurse.   Access used: [facility] Access issues: none  Total UF removed: 1L Medication(s) given: none    [facility]

## 2024-03-28 NOTE — Progress Notes (Signed)
 PROGRESS NOTE    Michele Morales  FMW:[id number] DOB: 1976/06/19 DOA: 03/18/2024 PCP: Celestia Rosaline SQUIBB, NP    Brief Narrative:  48 year old with history of insulin -dependent diabetes, HTN, HLD, diabetic neuropathy comes to the ED with referral for PCP due to abnormal lab work.  Creatinine noted to be 2.5, hemoglobin 6.5.  Upon admission she was given unit PRBC transfusion, hemoglobin stabilized around 8.5.  To help with menstruation/menorrhagia she was started on Megace  per OB recommendations.  Pelvic ultrasound showed adenomyosis. Patient has continued to rise in creatinine therefore eventually temporary HD catheter was placed with plans for renal biopsy.  Antihypertensives were adjusted as mentioned below.   Assessment & Plan:  Principal Problem:   Symptomatic anemia Active Problems:   Type 2 diabetes mellitus with hyperglycemia, with long-term current use of insulin  (HCC)   Essential hypertension   Hyperkalemia   Acute kidney injury superimposed on stage 3b chronic kidney disease (HCC)    AKI on CKD stage IIIb: Metabolic Acidosis, Hyperkalemia Baseline creatinine around 2.0, around 3.5 this time.  Seen by nephrology.  Temporary HD catheter placed 7/29.  Nephrology team following. HD sessions per nephro. Will need tunneled catheter Renal Biopsy: Pending.    Acute on chronic anemia, symptomatic: Menorrhagia, related to adenomyosis -Baseline hemoglobin 10.0, admission hemoglobin 6.5.  Likely from heavy menstruation.  Received 2 units of PRBC.  B12, folate are normal.  Iron  studies overall looks okay, low ferritin.  Hemoglobin now is steady around 8.5.  1 dose of IV iron .   -TSH 3.5, pelvic ultrasound, adenomyosis - Prior provider discussed case with Dr. Barbra who recommended Megace  40 mg daily, Recommend outpatient follow-up with GYN   E. coli urinary tract infection Completed 5 days of Rocephin  7/30    Insulin -dependent diabetes: -Sliding scale and Accu-Cheks    Constipation:  -Aggressive bowel regimen.    Essential hypertension, uncontrolled Improved.  Continue hydralazine  50 mg every 8 hours, labetalol  200 mg twice daily.  Will continue to adjust as needed.   DVT prophylaxis: SCD Code Status: Full code Family Communication:FAMILY bedside Disposition Plan:  Status is: Inpatient Remains inpatient appropriate because: management of Anemia, UTI, AKI  Will discharge once cleared by nephrology, anticipating early next week    Subjective: Seen in HD, no complaints.   Examination:  General exam: Appears calm and comfortable  Respiratory system: Clear to auscultation. Respiratory effort normal. Cardiovascular system: S1 & S2 heard, RRR. No JVD, murmurs, rubs, gallops or clicks. No pedal edema. Gastrointestinal system: Abdomen is nondistended, soft and nontender. No organomegaly or masses felt. Normal bowel sounds heard. Central nervous system: Alert and oriented. No focal neurological deficits. Extremities: Symmetric 5 x 5 power. Skin: No rashes, lesions or ulcers Psychiatry: Judgement and insight appear normal. Mood & affect appropriate.                Diet Orders (From admission, onward)     Start     Ordered   03/24/24 1336  Diet renal with fluid restriction Fluid restriction: 1200 mL Fluid; Room service appropriate? Yes; Fluid consistency: Thin  Diet effective now       Question Answer Comment  Fluid restriction: 1200 mL Fluid   Room service appropriate? Yes   Fluid consistency: Thin      03/24/24 1335            Objective: Vitals:   03/28/24 0750 03/28/24 0801 03/28/24 0830 03/28/24 0900  BP: (!) 162/82 (!) 164/83 (!) 156/74 (!) 162/78  Pulse: 94  95 96 97  Resp: 16 11 15 13   Temp: 97.6 F (36.4 C)     TempSrc:      SpO2:   97%   Weight: 66.6 kg     Height:        Intake/Output Summary (Last 24 hours) at 03/28/2024 0925 Last data filed at 03/27/2024 1830 Gross per 24 hour  Intake 473 ml  Output 500 ml   Net -27 ml   Filed Weights   03/25/24 1920 03/27/24 0551 03/28/24 0750  Weight: 67.8 kg 65.1 kg 66.6 kg    Scheduled Meds:  Chlorhexidine  Gluconate Cloth  6 each Topical Q0600   cholecalciferol   5,000 Units Oral Q M,W,F   darbepoetin (ARANESP ) injection - DIALYSIS  100 mcg Subcutaneous Q Sun-1800   feeding supplement (NEPRO CARB STEADY)  237 mL Oral BID BM   heparin   2,000 Units Dialysis Once in dialysis   hydrALAZINE   50 mg Oral Q8H   insulin  aspart  0-9 Units Subcutaneous TID WC   insulin  glargine-yfgn  13 Units Subcutaneous Daily   labetalol   200 mg Oral BID   megestrol   40 mg Oral Daily   pantoprazole  (PROTONIX ) IV  40 mg Intravenous Q12H   polyethylene glycol  17 g Oral BID   rosuvastatin   10 mg Oral Daily   senna  1 tablet Oral Daily   vitamin B-12  500 mcg Oral Daily   Continuous Infusions:  Nutritional status     Body mass index is 29.66 kg/m.  Data Reviewed:   CBC: Recent Labs  Lab 03/24/24 0641 03/25/24 0703 03/26/24 0743 03/27/24 0640 03/28/24 0737  WBC 15.2* 15.4* 11.7* 14.5* 14.7*  HGB 8.2* 7.9* 7.5* 7.7* 7.9*  HCT 24.3* 23.5* 22.2* 23.4* 24.7*  MCV 86.8 87.7 88.4 90.0 91.1  PLT 213 193 199 230 241   Basic Metabolic Panel: Recent Labs  Lab 03/24/24 0641 03/25/24 0703 03/25/24 1158 03/26/24 0743 03/27/24 0640 03/28/24 0737  NA 131* 132* 130* 132* 133* 131*  K 4.2 4.8 4.8 4.6 5.6* 5.1  CL 97* 97* 99 97* 97* 97*  CO2 23 24 23 25 26 24   GLUCOSE 197* 172* 199* 150* 129* 159*  BUN 27* 35* 36* 21* 36* 48*  CREATININE 3.25* 3.83* 3.95* 3.21* 3.91* 4.13*  CALCIUM  7.7* 7.7* 7.8* 7.7* 7.8* 8.4*  MG 1.9 2.0  --  1.8 1.9 2.0  PHOS 4.6  --  5.3*  --   --  4.6   GFR: Estimated Creatinine Clearance: 13.8 mL/min (A) (by C-G formula based on SCr of 4.13 mg/dL (H)). Liver Function Tests: Recent Labs  Lab 03/25/24 1158 03/28/24 0737  ALBUMIN  2.5* 2.8*   No results for input(s): LIPASE, AMYLASE in the last 168 hours. No results for input(s):  AMMONIA in the last 168 hours. Coagulation Profile: Recent Labs  Lab 03/23/24 0557  INR 1.1   Cardiac Enzymes: No results for input(s): CKTOTAL, CKMB, CKMBINDEX, TROPONINI in the last 168 hours. BNP (last 3 results) No results for input(s): PROBNP in the last 8760 hours. HbA1C: No results for input(s): HGBA1C in the last 72 hours. CBG: Recent Labs  Lab 03/26/24 2107 03/27/24 0742 03/27/24 1211 03/27/24 1700 03/27/24 2055  GLUCAP 173* 140* 173* 129* 129*   Lipid Profile: No results for input(s): CHOL, HDL, LDLCALC, TRIG, CHOLHDL, LDLDIRECT in the last 72 hours. Thyroid Function Tests: No results for input(s): TSH, T4TOTAL, FREET4, T3FREE, THYROIDAB in the last 72 hours. Anemia Panel: No results for input(s): VITAMINB12,  FOLATE, FERRITIN, TIBC, IRON , RETICCTPCT in the last 72 hours. Sepsis Labs: No results for input(s): PROCALCITON, LATICACIDVEN in the last 168 hours.  Recent Results (from the past 240 hours)  Urine Culture (for pregnant, neutropenic or urologic patients or patients with an indwelling urinary catheter)     Status: Abnormal   Collection Time: 03/19/24 12:47 PM   Specimen: Urine, Clean Catch  Result Value Ref Range Status   Specimen Description URINE, CLEAN CATCH  Final   Special Requests   Final    NONE Performed at [facility], [address], [place], [place] [address]    Culture >=100,000 COLONIES/mL ESCHERICHIA COLI (A)  Final   Report Status 03/22/2024 FINAL  Final   Organism ID, Bacteria ESCHERICHIA COLI (A)  Final      Susceptibility   Escherichia coli - MIC*    AMPICILLIN <=2 SENSITIVE Sensitive     CEFAZOLIN  <=4 SENSITIVE Sensitive     CEFEPIME <=0.12 SENSITIVE Sensitive     CEFTRIAXONE  <=0.25 SENSITIVE Sensitive     CIPROFLOXACIN >=4 RESISTANT Resistant     GENTAMICIN <=1 SENSITIVE Sensitive     IMIPENEM <=0.25 SENSITIVE Sensitive     NITROFURANTOIN <=16 SENSITIVE Sensitive      TRIMETH/SULFA <=20 SENSITIVE Sensitive     AMPICILLIN/SULBACTAM <=2 SENSITIVE Sensitive     PIP/TAZO <=4 SENSITIVE Sensitive ug/mL    * >=100,000 COLONIES/mL ESCHERICHIA COLI         Radiology Studies: No results found.         LOS: 10 days   Time spent= 35 mins    Burgess JAYSON Dare, MD [facility]  If 7PM-7AM, please contact night-coverage  03/28/2024, 9:25 AM

## 2024-03-28 NOTE — Procedures (Signed)
 I was present at this dialysis session. I have reviewed the session and made appropriate changes.   R internal jugular Temp HD cath, UF goal 1L, 2K bath with K of 5.1.  Hb 7.9, slowly improving. BPs ok.  Pt w/o c/o or needs  Await results of biopsy, hopefully today or tomorrow. C3, C4, HIV, HBV, HCV, SFLC, SPEP negative. UA was /o hematuria but pyuria was present  Revisit vascular access after biopsy results as well.    UOP 1L, SCr inc over weekend, tentative next HD 8/6  [facility] Weights   03/25/24 1920 03/27/24 0551 03/28/24 0750  Weight: 67.8 kg 65.1 kg 66.6 kg    Recent Labs  Lab 03/28/24 0737  NA 131*  K 5.1  CL 97*  CO2 24  GLUCOSE 159*  BUN 48*  CREATININE 4.13*  CALCIUM  8.4*  PHOS 4.6    Recent Labs  Lab 03/26/24 0743 03/27/24 0640 03/28/24 0737  WBC 11.7* 14.5* 14.7*  HGB 7.5* 7.7* 7.9*  HCT 22.2* 23.4* 24.7*  MCV 88.4 90.0 91.1  PLT 199 230 241    Scheduled Meds:  Chlorhexidine  Gluconate Cloth  6 each Topical Q0600   cholecalciferol   5,000 Units Oral Q M,W,F   darbepoetin (ARANESP ) injection - DIALYSIS  100 mcg Subcutaneous Q Sun-1800   feeding supplement (NEPRO CARB STEADY)  237 mL Oral BID BM   heparin   2,000 Units Dialysis Once in dialysis   hydrALAZINE   50 mg Oral Q8H   insulin  aspart  0-9 Units Subcutaneous TID WC   insulin  glargine-yfgn  13 Units Subcutaneous Daily   labetalol   200 mg Oral BID   megestrol   40 mg Oral Daily   pantoprazole  (PROTONIX ) IV  40 mg Intravenous Q12H   polyethylene glycol  17 g Oral BID   rosuvastatin   10 mg Oral Daily   senna  1 tablet Oral Daily   vitamin B-12  500 mcg Oral Daily   Continuous Infusions: PRN Meds:.acetaminophen , guaiFENesin , hydrALAZINE , ipratropium-albuterol , labetalol , ondansetron  (ZOFRAN ) IV, ondansetron  **OR** [DISCONTINUED] ondansetron  (ZOFRAN ) IV, prochlorperazine , senna-docusate, traZODone    Bernardino Gasman  MD 03/28/2024, 8:56 AM

## 2024-03-28 NOTE — Progress Notes (Signed)
 Receive pt HD in bed. Pt is vomiting. Call button in reach. Husband is at bedside.

## 2024-03-28 NOTE — Plan of Care (Signed)
  Problem: Skin Integrity: Goal: Risk for impaired skin integrity will decrease Outcome: Progressing   Problem: Clinical Measurements: Goal: Diagnostic test results will improve Outcome: Progressing   Problem: Activity: Goal: Risk for activity intolerance will decrease Outcome: Progressing   Problem: Nutrition: Goal: Adequate nutrition will be maintained Outcome: Progressing

## 2024-03-29 ENCOUNTER — Encounter (HOSPITAL_COMMUNITY): Payer: Self-pay

## 2024-03-29 ENCOUNTER — Inpatient Hospital Stay (HOSPITAL_COMMUNITY): Payer: MEDICAID

## 2024-03-29 DIAGNOSIS — N186 End stage renal disease: Secondary | ICD-10-CM

## 2024-03-29 DIAGNOSIS — I120 Hypertensive chronic kidney disease with stage 5 chronic kidney disease or end stage renal disease: Secondary | ICD-10-CM

## 2024-03-29 DIAGNOSIS — E1122 Type 2 diabetes mellitus with diabetic chronic kidney disease: Secondary | ICD-10-CM

## 2024-03-29 DIAGNOSIS — D539 Nutritional anemia, unspecified: Secondary | ICD-10-CM

## 2024-03-29 DIAGNOSIS — Z992 Dependence on renal dialysis: Secondary | ICD-10-CM

## 2024-03-29 DIAGNOSIS — E785 Hyperlipidemia, unspecified: Secondary | ICD-10-CM

## 2024-03-29 LAB — CBC
HCT: 21.9 % — ABNORMAL LOW (ref 36.0–46.0)
Hemoglobin: 7.1 g/dL — ABNORMAL LOW (ref 12.0–15.0)
MCH: 29.6 pg (ref 26.0–34.0)
MCHC: 32.4 g/dL (ref 30.0–36.0)
MCV: 91.3 fL (ref 80.0–100.0)
Platelets: 239 K/uL (ref 150–400)
RBC: 2.4 MIL/uL — ABNORMAL LOW (ref 3.87–5.11)
RDW: 13.4 % (ref 11.5–15.5)
WBC: 12.5 K/uL — ABNORMAL HIGH (ref 4.0–10.5)
nRBC: 0 % (ref 0.0–0.2)

## 2024-03-29 LAB — BASIC METABOLIC PANEL WITH GFR
Anion gap: 8 (ref 5–15)
BUN: 27 mg/dL — ABNORMAL HIGH (ref 6–20)
CO2: 28 mmol/L (ref 22–32)
Calcium: 7.8 mg/dL — ABNORMAL LOW (ref 8.9–10.3)
Chloride: 96 mmol/L — ABNORMAL LOW (ref 98–111)
Creatinine, Ser: 2.82 mg/dL — ABNORMAL HIGH (ref 0.44–1.00)
GFR, Estimated: 20 mL/min — ABNORMAL LOW (ref >60)
Glucose, Bld: 186 mg/dL — ABNORMAL HIGH (ref 70–99)
Potassium: 4.3 mmol/L (ref 3.5–5.1)
Sodium: 132 mmol/L — ABNORMAL LOW (ref 135–145)

## 2024-03-29 LAB — GLUCOSE, CAPILLARY
Glucose-Capillary: 193 mg/dL — ABNORMAL HIGH (ref 70–99)
Glucose-Capillary: 175 mg/dL — ABNORMAL HIGH (ref 70–99)
Glucose-Capillary: 235 mg/dL — ABNORMAL HIGH (ref 70–99)
Glucose-Capillary: 170 mg/dL — ABNORMAL HIGH (ref 70–99)

## 2024-03-29 LAB — SURGICAL PATHOLOGY

## 2024-03-29 LAB — SURGICAL PCR SCREEN
MRSA, PCR: NEGATIVE
Staphylococcus aureus: NEGATIVE

## 2024-03-29 LAB — PREGNANCY, URINE: Preg Test, Ur: NEGATIVE

## 2024-03-29 LAB — MAGNESIUM: Magnesium: 1.9 mg/dL (ref 1.7–2.4)

## 2024-03-29 LAB — PREPARE RBC (CROSSMATCH)

## 2024-03-29 MED ORDER — SODIUM CHLORIDE 0.9% FLUSH
10.0000 mL | Freq: Two times a day (BID) | INTRAVENOUS | Status: DC
  Administered 2024-03-29 – 2024-03-30 (×3): 10 mL

## 2024-03-29 MED ORDER — IRON SUCROSE 200 MG IVPB - SIMPLE MED
200.0000 mg | Status: DC
  Filled 2024-03-29: qty 110

## 2024-03-29 MED ORDER — SODIUM CHLORIDE 0.9 % IV SOLN
200.0000 mg | INTRAVENOUS | Status: DC
  Administered 2024-03-29 – 2024-03-31 (×2): 200 mg via INTRAVENOUS
  Filled 2024-03-29 (×3): qty 10

## 2024-03-29 MED ORDER — SODIUM CHLORIDE 0.9% IV SOLUTION: Freq: Once | INTRAVENOUS | Status: AC

## 2024-03-29 MED ORDER — HYDROXYZINE HCL 25 MG PO TABS
25.0000 mg | ORAL_TABLET | Freq: Three times a day (TID) | ORAL | Status: DC | PRN
  Administered 2024-03-29: 25 mg via ORAL
  Filled 2024-03-29: qty 1

## 2024-03-29 MED ORDER — PANTOPRAZOLE SODIUM 40 MG PO TBEC
40.0000 mg | DELAYED_RELEASE_TABLET | Freq: Two times a day (BID) | ORAL | Status: DC
  Administered 2024-03-29 – 2024-03-31 (×3): 40 mg via ORAL
  Filled 2024-03-29 (×3): qty 1

## 2024-03-29 NOTE — Progress Notes (Addendum)
 Spoke at bedside with pt and interpreter regarding outpatient dialysis options and preferences. Pt is agreeable to referral to [facility] to local HD clinic. Pt states family will assist with transportation to HD appts. Pt referral will require financial clearance due to being uninsured.   Lavanda Fredrickson Dialysis Navigator [phone]  Addendum 12:39 Referral has been completed to [facility] admissions for review. Will continue to assist.

## 2024-03-29 NOTE — Progress Notes (Signed)
 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 forward Discussed with patient in room using video interpretor DIscussed CLIP and vascular access Pt w/o acute c/o today  HD yesterday: 1L UF using temp cath  08/04 0701 - 08/05 0700 In: 354 [P.O.:354] Out: 1510 [Urine:500; Emesis/NG output:10]  Filed Weights   03/27/24 0551 03/28/24 0750 03/28/24 1116  Weight: 65.1 kg 66.6 kg 65.5 kg    Scheduled Meds:  sodium chloride    Intravenous Once   Chlorhexidine  Gluconate Cloth  6 each Topical Q0600   cholecalciferol   5,000 Units Oral Q M,W,F   darbepoetin (ARANESP ) injection - DIALYSIS  100 mcg Subcutaneous Q Sun-1800   feeding supplement (NEPRO CARB STEADY)  237 mL Oral BID BM   hydrALAZINE   50 mg Oral Q8H   insulin  aspart  0-9 Units Subcutaneous TID WC   insulin  glargine-yfgn  13 Units Subcutaneous Daily   labetalol   200 mg Oral BID   megestrol   40 mg Oral Daily   pantoprazole   40 mg Oral BID AC   polyethylene glycol  17 g Oral BID   rosuvastatin   10 mg Oral Daily   senna  1 tablet Oral Daily   sodium chloride  flush  10-40 mL Intracatheter Q12H   vitamin B-12  500 mcg Oral Daily   Continuous Infusions: PRN Meds:.acetaminophen , guaiFENesin , hydrALAZINE , ipratropium-albuterol , labetalol , ondansetron  (ZOFRAN ) IV, ondansetron  **OR** [DISCONTINUED] ondansetron  (ZOFRAN ) IV, prochlorperazine , senna-docusate, traZODone   Current Labs: reviewed   Physical Exam:  Blood pressure (!) 161/83, pulse (!) 101, temperature 98.4 F (36.9 C), temperature source Oral, resp. rate 17, height 4' 11 (1.499 m), weight 65.5 kg, last menstrual period 03/04/2024, SpO2 97%. 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 Morphine should not be used.  Baclofen should be avoided Avoid oral sodium phosphate  and magnesium citrate based laxatives / bowel preps    Bernardino Gasman MD 03/29/2024, 11:59 AM  Recent Labs  Lab 03/24/24 9358 03/25/24 0703 03/25/24 1158 03/26/24 0743 03/27/24 0640 03/28/24 0737 03/29/24 0415  NA 131*   < > 130*   < > 133* 131* 132*  K 4.2   < > 4.8   < > 5.6* 5.1 4.3  CL 97*   < > 99   < > 97* 97* 96*  CO2 23   < > 23   < > 26 24 28   GLUCOSE 197*   < > 199*   < > 129* 159* 186*  BUN 27*   < > 36*   < > 36* 48* 27*  CREATININE 3.25*   < > 3.95*   < > 3.91* 4.13* 2.82*  CALCIUM  7.7*   < > 7.8*   < > 7.8* 8.4* 7.8*  PHOS 4.6  --  5.3*  --   --  4.6  --    < > =  values in this interval not displayed.   Recent Labs  Lab 03/27/24 0640 03/28/24 0737 03/29/24 0415  WBC 14.5* 14.7* 12.5*  HGB 7.7* 7.9* 7.1*  HCT 23.4* 24.7* 21.9*  MCV 90.0 91.1 91.3  PLT 230 241 239

## 2024-03-29 NOTE — Progress Notes (Signed)
 PROGRESS NOTE    Michele Morales  FMW:[id number] DOB: 02-Dec-1975 DOA: 03/18/2024 PCP: Celestia Rosaline SQUIBB, NP    Brief Narrative:  48 year old with history of insulin -dependent diabetes, HTN, HLD, diabetic neuropathy comes to the ED with referral for PCP due to abnormal lab work.  Creatinine noted to be 2.5, hemoglobin 6.5.  Upon admission she was given unit PRBC transfusion, hemoglobin stabilized around 8.5.  To help with menstruation/menorrhagia she was started on Megace  per OB recommendations.  Pelvic ultrasound showed adenomyosis. Patient has continued to rise in creatinine therefore eventually temporary HD catheter was placed with plans for renal biopsy.  Antihypertensives were adjusted as mentioned below.   Assessment & Plan:  Principal Problem:   Symptomatic anemia Active Problems:   Type 2 diabetes mellitus with hyperglycemia, with long-term current use of insulin  (HCC)   Essential hypertension   Hyperkalemia   Acute kidney injury superimposed on stage 3b chronic kidney disease (HCC)    AKI on CKD stage IIIb: Metabolic Acidosis, Hyperkalemia Baseline creatinine around 2.0, around 3.5 this time.  Seen by nephrology.  Temporary HD catheter placed 7/29.  Nephrology team following. HD sessions per nephro. Vascular consulted for pulmonary access placement Renal Biopsy: Pending.    Acute on chronic anemia, symptomatic: Menorrhagia, related to adenomyosis -Baseline hemoglobin 10.0, admission hemoglobin 6.5.  Likely from heavy menstruation.  Received 2 units of PRBC.  B12, folate are normal.  Iron  studies overall looks okay, low ferritin.  Hemoglobin now is teady around 7.1.  s/p 1 dose IV Iron . Will order 1U PRBC today.  -TSH 3.5, pelvic ultrasound, adenomyosis - Prior provider discussed case with Dr. Barbra who recommended Megace  40 mg daily, Recommend outpatient follow-up with GYN   E. coli urinary tract infection Completed 5 days of Rocephin  7/30    Insulin -dependent  diabetes: -Sliding scale and Accu-Cheks   Constipation:  -Aggressive bowel regimen.    Essential hypertension, uncontrolled Improved.  Continue hydralazine  50 mg every 8 hours, labetalol  200 mg twice daily.  Will continue to adjust as needed.   DVT prophylaxis: SCD Code Status: Full code Family Communication:FAMILY bedside Disposition Plan:  Status is: Inpatient Remains inpatient appropriate because: management of Anemia, UTI, AKI  Will discharge once cleared by nephrology, anticipating early next week    Subjective: Interpreter ID [phone] No complaints Husband present at bedside Examination:  General exam: Appears calm and comfortable  Respiratory system: Clear to auscultation. Respiratory effort normal. Cardiovascular system: S1 & S2 heard, RRR. No JVD, murmurs, rubs, gallops or clicks. No pedal edema. Gastrointestinal system: Abdomen is nondistended, soft and nontender. No organomegaly or masses felt. Normal bowel sounds heard. Central nervous system: Alert and oriented. No focal neurological deficits. Extremities: Symmetric 5 x 5 power. Skin: No rashes, lesions or ulcers Psychiatry: Judgement and insight appear normal. Mood & affect appropriate.                Diet Orders (From admission, onward)     Start     Ordered   03/24/24 1336  Diet renal with fluid restriction Fluid restriction: 1200 mL Fluid; Room service appropriate? Yes; Fluid consistency: Thin  Diet effective now       Question Answer Comment  Fluid restriction: 1200 mL Fluid   Room service appropriate? Yes   Fluid consistency: Thin      03/24/24 1335            Objective: Vitals:   03/28/24 2233 03/29/24 0456 03/29/24 0557 03/29/24 0823  BP: (!) 151/102 ROLLEN)  155/80 (!) 155/80 (!) 161/83  Pulse: (!) 101 98  (!) 101  Resp:  20  17  Temp:  98.7 F (37.1 C)  98.4 F (36.9 C)  TempSrc:    Oral  SpO2:  98%  97%  Weight:      Height:        Intake/Output Summary (Last 24 hours) at  03/29/2024 1121 Last data filed at 03/29/2024 0800 Gross per 24 hour  Intake 354 ml  Output 710 ml  Net -356 ml   Filed Weights   03/27/24 0551 03/28/24 0750 03/28/24 1116  Weight: 65.1 kg 66.6 kg 65.5 kg    Scheduled Meds:  sodium chloride    Intravenous Once   Chlorhexidine  Gluconate Cloth  6 each Topical Q0600   cholecalciferol   5,000 Units Oral Q M,W,F   darbepoetin (ARANESP ) injection - DIALYSIS  100 mcg Subcutaneous Q Sun-1800   feeding supplement (NEPRO CARB STEADY)  237 mL Oral BID BM   hydrALAZINE   50 mg Oral Q8H   insulin  aspart  0-9 Units Subcutaneous TID WC   insulin  glargine-yfgn  13 Units Subcutaneous Daily   labetalol   200 mg Oral BID   megestrol   40 mg Oral Daily   pantoprazole   40 mg Oral BID AC   polyethylene glycol  17 g Oral BID   rosuvastatin   10 mg Oral Daily   senna  1 tablet Oral Daily   sodium chloride  flush  10-40 mL Intracatheter Q12H   vitamin B-12  500 mcg Oral Daily   Continuous Infusions:  Nutritional status     Body mass index is 29.17 kg/m.  Data Reviewed:   CBC: Recent Labs  Lab 03/25/24 0703 03/26/24 0743 03/27/24 0640 03/28/24 0737 03/29/24 0415  WBC 15.4* 11.7* 14.5* 14.7* 12.5*  HGB 7.9* 7.5* 7.7* 7.9* 7.1*  HCT 23.5* 22.2* 23.4* 24.7* 21.9*  MCV 87.7 88.4 90.0 91.1 91.3  PLT 193 199 230 241 239   Basic Metabolic Panel: Recent Labs  Lab 03/24/24 0641 03/25/24 0703 03/25/24 1158 03/26/24 0743 03/27/24 0640 03/28/24 0737 03/29/24 0415  NA 131* 132* 130* 132* 133* 131* 132*  K 4.2 4.8 4.8 4.6 5.6* 5.1 4.3  CL 97* 97* 99 97* 97* 97* 96*  CO2 23 24 23 25 26 24 28   GLUCOSE 197* 172* 199* 150* 129* 159* 186*  BUN 27* 35* 36* 21* 36* 48* 27*  CREATININE 3.25* 3.83* 3.95* 3.21* 3.91* 4.13* 2.82*  CALCIUM  7.7* 7.7* 7.8* 7.7* 7.8* 8.4* 7.8*  MG 1.9 2.0  --  1.8 1.9 2.0 1.9  PHOS 4.6  --  5.3*  --   --  4.6  --    GFR: Estimated Creatinine Clearance: 20.1 mL/min (A) (by C-G formula based on SCr of 2.82 mg/dL (H)). Liver  Function Tests: Recent Labs  Lab 03/25/24 1158 03/28/24 0737  ALBUMIN  2.5* 2.8*   No results for input(s): LIPASE, AMYLASE in the last 168 hours. No results for input(s): AMMONIA in the last 168 hours. Coagulation Profile: Recent Labs  Lab 03/23/24 0557  INR 1.1   Cardiac Enzymes: No results for input(s): CKTOTAL, CKMB, CKMBINDEX, TROPONINI in the last 168 hours. BNP (last 3 results) No results for input(s): PROBNP in the last 8760 hours. HbA1C: No results for input(s): HGBA1C in the last 72 hours. CBG: Recent Labs  Lab 03/27/24 2055 03/28/24 1218 03/28/24 1716 03/28/24 2042 03/29/24 0821  GLUCAP 129* 123* 219* 178* 193*   Lipid Profile: No results for input(s): CHOL, HDL,  LDLCALC, TRIG, CHOLHDL, LDLDIRECT in the last 72 hours. Thyroid Function Tests: No results for input(s): TSH, T4TOTAL, FREET4, T3FREE, THYROIDAB in the last 72 hours. Anemia Panel: No results for input(s): VITAMINB12, FOLATE, FERRITIN, TIBC, IRON , RETICCTPCT in the last 72 hours. Sepsis Labs: No results for input(s): PROCALCITON, LATICACIDVEN in the last 168 hours.  Recent Results (from the past 240 hours)  Urine Culture (for pregnant, neutropenic or urologic patients or patients with an indwelling urinary catheter)     Status: Abnormal   Collection Time: 03/19/24 12:47 PM   Specimen: Urine, Clean Catch  Result Value Ref Range Status   Specimen Description URINE, CLEAN CATCH  Final   Special Requests   Final    NONE Performed at [facility], [address] [place], [place] [address]    Culture >=100,000 COLONIES/mL ESCHERICHIA COLI (A)  Final   Report Status 03/22/2024 FINAL  Final   Organism ID, Bacteria ESCHERICHIA COLI (A)  Final      Susceptibility   Escherichia coli - MIC*    AMPICILLIN <=2 SENSITIVE Sensitive     CEFAZOLIN  <=4 SENSITIVE Sensitive     CEFEPIME <=0.12 SENSITIVE Sensitive     CEFTRIAXONE  <=0.25 SENSITIVE  Sensitive     CIPROFLOXACIN >=4 RESISTANT Resistant     GENTAMICIN <=1 SENSITIVE Sensitive     IMIPENEM <=0.25 SENSITIVE Sensitive     NITROFURANTOIN <=16 SENSITIVE Sensitive     TRIMETH/SULFA <=20 SENSITIVE Sensitive     AMPICILLIN/SULBACTAM <=2 SENSITIVE Sensitive     PIP/TAZO <=4 SENSITIVE Sensitive ug/mL    * >=100,000 COLONIES/mL ESCHERICHIA COLI         Radiology Studies: No results found.         LOS: 11 days   Time spent= 35 mins    Burgess JAYSON Dare, MD [facility]  If 7PM-7AM, please contact night-coverage  03/29/2024, 11:21 AM

## 2024-03-29 NOTE — Progress Notes (Signed)
 Patient complaining of nausea, prn compazine  given. Pt reports relief.

## 2024-03-29 NOTE — Plan of Care (Signed)
  Problem: Fluid Volume: Goal: Ability to maintain a balanced intake and output will improve Outcome: Progressing   Problem: Safety: Goal: Ability to remain free from injury will improve Outcome: Progressing   

## 2024-03-29 NOTE — Consult Note (Addendum)
 Hospital Consult    Reason for Consult: Permanent dialysis access Requesting Physician: Nephrology MRN #:  [id number]  History of Present Illness: This is a 48 y.o. 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  She is being seen in consultation for evaluation for permanent dialysis access placement.  She is right arm dominant with peripheral access placement in the left arm.  She does not take a blood thinner.  She does not have a pacemaker.  It should be noted that she is receiving a unit of blood for chronic anemia.  A medical interpreter was used during today's visit.  Past Medical History:  Diagnosis Date   Diabetes mellitus without complication (HCC)    IDDM   Hypercholesteremia    Hypertension    Neuromuscular disorder (HCC)    diabetic neuropathy feet    Past Surgical History:  Procedure Laterality Date   hand procedure Right    right wrist - used sedation to reset in the ER Dept   IR FLUORO GUIDE CV LINE RIGHT  03/22/2024   IR US  GUIDE VASC ACCESS RIGHT  03/22/2024   ORIF WRIST FRACTURE Right 10/15/2022   Procedure: OPEN REDUCTION INTERNAL FIXATION (ORIF) WRIST FRACTURE;  Surgeon: Shari Easter, MD;  Location: [facility] OR;  Service: Orthopedics;  Laterality: Right;  regional with iv sedation    Allergies  Allergen Reactions   Amlodipine  Swelling    BLE edema    Prior to Admission medications   Medication Sig Start Date End Date Taking? Authorizing Provider  Dulaglutide  (TRULICITY ) 1.5 MG/0.5ML SOAJ Inject 1.5 mg into the skin once a week. 03/16/24  Yes Celestia Rosaline SQUIBB, NP  hydrochlorothiazide  (HYDRODIURIL ) 25 MG tablet Take 1 tablet (25 mg total) by mouth daily. 03/16/24  Yes Celestia Rosaline SQUIBB, NP  Insulin  Glargine (BASAGLAR  KWIKPEN) 100 UNIT/ML Inject 26 Units into the skin daily. Patient taking differently: Inject 20 Units into the skin daily. 03/16/24  Yes Celestia Rosaline SQUIBB, NP  lisinopril  (ZESTRIL ) 20 MG tablet Take 1 tablet (20 mg total) by mouth daily. 03/16/24  Yes Celestia Rosaline SQUIBB, NP  rosuvastatin  (CRESTOR ) 10 MG tablet Take 1 tablet (10 mg total) by mouth daily. 06/17/23  Yes Mayers, Cari S, PA-C  glucose blood (TRUE METRIX BLOOD GLUCOSE TEST) test strip Use to check blood sugar three times daily. 07/16/23   Newlin, Enobong, MD  Insulin  Pen Needle (PENTIPS) 32G X 4 MM MISC use as directed 06/17/23   Mayers, Cari S, PA-C  TRUEplus Lancets 28G MISC Use to check blood sugar three times daily. 07/16/23   Delbert Clam, MD    Social History   Socioeconomic History   Marital status: Single    Spouse name: Not on file   Number of children: Not on file   Years of education: Not on file   Highest education level: Not on file  Occupational History   Not on file  Tobacco Use   Smoking status: Never   Smokeless tobacco: Never  Vaping Use   Vaping status: Never Used  Substance and Sexual Activity   Alcohol use: No   Drug use: No   Sexual activity: Not Currently    Birth control/protection: Injection    Comment: Depro  Other Topics Concern   Not on file  Social History Narrative   Not on file   Social Drivers of Health   Financial Resource Strain: Not on file  Food Insecurity: No Food Insecurity (  03/19/2024)   Hunger Vital Sign    Worried About Running Out of Food in the Last Year: Never true    Ran Out of Food in the Last Year: Never true  Transportation Needs: No Transportation Needs (03/19/2024)   PRAPARE - Administrator, Civil Service (Medical): No    Lack of Transportation (Non-Medical): No  Physical Activity: Not on file  Stress: Not on file  Social Connections: Not on file  Intimate Partner Violence: Not At Risk (03/19/2024)   Humiliation, Afraid, Rape, and Kick questionnaire    Fear of Current or Ex-Partner: No    Emotionally Abused: No    Physically Abused: No    Sexually Abused: No    Family History   Problem Relation Age of Onset   Diabetes Mother     ROS: Otherwise negative unless mentioned in HPI  Physical Examination  Vitals:   03/29/24 0557 03/29/24 0823  BP: (!) 155/80 (!) 161/83  Pulse:  (!) 101  Resp:  17  Temp:  98.4 F (36.9 C)  SpO2:  97%   Body mass index is 29.17 kg/m.  General:  WDWN in NAD Gait: Not observed HENT: WNL, normocephalic Pulmonary: normal non-labored breathing Cardiac: regular Abdomen: = soft, NT/ND, no masses Skin: without rashes Vascular Exam/Pulses: symmetrical radial pulses Extremities: without ischemic changes, without Gangrene , without cellulitis; without open wounds;  Musculoskeletal: no muscle wasting or atrophy  Neurologic: A&O X 3;  No focal weakness or paresthesias are detected; speech is fluent/normal Psychiatric:  The pt has Normal affect. Lymph:  Unremarkable  CBC    Component Value Date/Time   WBC 12.5 (H) 03/29/2024 0415   RBC 2.40 (L) 03/29/2024 0415   HGB 7.1 (L) 03/29/2024 0415   HGB 7.3 (L) 03/16/2024 0958   HGB 13.4 04/09/2017 0000   HCT 21.9 (L) 03/29/2024 0415   HCT 23.1 (L) 03/16/2024 0958   HCT 41 04/09/2017 0000   PLT 239 03/29/2024 0415   PLT 392 03/16/2024 0958   PLT 370 04/09/2017 0000   MCV 91.3 03/29/2024 0415   MCV 92 03/16/2024 0958   MCH 29.6 03/29/2024 0415   MCHC 32.4 03/29/2024 0415   RDW 13.4 03/29/2024 0415   RDW 14.6 03/16/2024 0958   LYMPHSABS 1.6 03/18/2024 2015   LYMPHSABS 1.7 03/16/2024 0958   MONOABS 0.8 03/18/2024 2015   EOSABS 0.2 03/18/2024 2015   EOSABS 0.3 03/16/2024 0958   BASOSABS 0.1 03/18/2024 2015   BASOSABS 0.1 03/16/2024 0958    BMET    Component Value Date/Time   NA 132 (L) 03/29/2024 0415   NA 135 03/16/2024 0958   K 4.3 03/29/2024 0415   CL 96 (L) 03/29/2024 0415   CO2 28 03/29/2024 0415   GLUCOSE 186 (H) 03/29/2024 0415   BUN 27 (H) 03/29/2024 0415   BUN 50 (H) 03/16/2024 0958   CREATININE 2.82 (H) 03/29/2024 0415   CREATININE 0.51 01/31/2021 1454    CALCIUM  7.8 (L) 03/29/2024 0415   GFRNONAA 20 (L) 03/29/2024 0415   GFRNONAA 117 01/31/2021 1454   GFRAA 136 01/31/2021 1454    COAGS: Lab Results  Component Value Date   INR 1.1 03/23/2024   INR 1.1 01/21/2021     Non-Invasive Vascular Imaging:   Vein mapping pending    ASSESSMENT/PLAN: This is a 49 y.o. female with end-stage renal disease now on hemodialysis being seen in consultation for evaluation for permanent dialysis access  Ms. Wirthlin is a 48 year old female who has  been started on hemodialysis.  She is being seen in consultation for evaluation for permanent access placement.  She has not had any prior access surgery.  She is right arm dominant with peripheral access placement in the left arm.  Vein mapping is pending.  If at all possible she would like to proceed with access placement prior to being discharged home.  Surgery was discussed in detail with the patient and she is agreeable to proceed.  On-call vascular surgeon Dr. Sheree will evaluate the patient later today and provide further treatment plans including timing of her surgery.  She will be scheduled for left arm AV fistula creation versus graft placement as well as catheter exchange for TDC.   Donnice Sender PA-C [facility] [phone]  I have independently interviewed and examined patient and agree with PA assessment and plan above.  Plan for left arm AV fistula versus graft with [facility] tomorrow in the OR.  She will be n.p.o. past midnight.  Patsie Mccardle C. Sheree, MD [facility] [place] Office: [phone] Pager: [phone]

## 2024-03-29 NOTE — Plan of Care (Signed)

## 2024-03-29 NOTE — Progress Notes (Signed)
 BUE vein mapping is completed. Verity Gilcrest, RVT

## 2024-03-29 NOTE — H&P (View-Only) (Signed)
 Hospital Consult    Reason for Consult: Permanent dialysis access Requesting Physician: Nephrology MRN #:  [id number]  History of Present Illness: This is a 48 y.o. 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  She is being seen in consultation for evaluation for permanent dialysis access placement.  She is right arm dominant with peripheral access placement in the left arm.  She does not take a blood thinner.  She does not have a pacemaker.  It should be noted that she is receiving a unit of blood for chronic anemia.  A medical interpreter was used during today's visit.  Past Medical History:  Diagnosis Date   Diabetes mellitus without complication (HCC)    IDDM   Hypercholesteremia    Hypertension    Neuromuscular disorder (HCC)    diabetic neuropathy feet    Past Surgical History:  Procedure Laterality Date   hand procedure Right    right wrist - used sedation to reset in the ER Dept   IR FLUORO GUIDE CV LINE RIGHT  03/22/2024   IR US  GUIDE VASC ACCESS RIGHT  03/22/2024   ORIF WRIST FRACTURE Right 10/15/2022   Procedure: OPEN REDUCTION INTERNAL FIXATION (ORIF) WRIST FRACTURE;  Surgeon: Shari Easter, MD;  Location: [facility] OR;  Service: Orthopedics;  Laterality: Right;  regional with iv sedation    Allergies  Allergen Reactions   Amlodipine  Swelling    BLE edema    Prior to Admission medications   Medication Sig Start Date End Date Taking? Authorizing Provider  Dulaglutide  (TRULICITY ) 1.5 MG/0.5ML SOAJ Inject 1.5 mg into the skin once a week. 03/16/24  Yes Celestia Rosaline SQUIBB, NP  hydrochlorothiazide  (HYDRODIURIL ) 25 MG tablet Take 1 tablet (25 mg total) by mouth daily. 03/16/24  Yes Celestia Rosaline SQUIBB, NP  Insulin  Glargine (BASAGLAR  KWIKPEN) 100 UNIT/ML Inject 26 Units into the skin daily. Patient taking differently: Inject 20 Units into the skin daily. 03/16/24  Yes Celestia Rosaline SQUIBB, NP  lisinopril  (ZESTRIL ) 20 MG tablet Take 1 tablet (20 mg total) by mouth daily. 03/16/24  Yes Celestia Rosaline SQUIBB, NP  rosuvastatin  (CRESTOR ) 10 MG tablet Take 1 tablet (10 mg total) by mouth daily. 06/17/23  Yes Mayers, Cari S, PA-C  glucose blood (TRUE METRIX BLOOD GLUCOSE TEST) test strip Use to check blood sugar three times daily. 07/16/23   Newlin, Enobong, MD  Insulin  Pen Needle (PENTIPS) 32G X 4 MM MISC use as directed 06/17/23   Mayers, Cari S, PA-C  TRUEplus Lancets 28G MISC Use to check blood sugar three times daily. 07/16/23   Delbert Clam, MD    Social History   Socioeconomic History   Marital status: Single    Spouse name: Not on file   Number of children: Not on file   Years of education: Not on file   Highest education level: Not on file  Occupational History   Not on file  Tobacco Use   Smoking status: Never   Smokeless tobacco: Never  Vaping Use   Vaping status: Never Used  Substance and Sexual Activity   Alcohol use: No   Drug use: No   Sexual activity: Not Currently    Birth control/protection: Injection    Comment: Depro  Other Topics Concern   Not on file  Social History Narrative   Not on file   Social Drivers of Health   Financial Resource Strain: Not on file  Food Insecurity: No Food Insecurity (  03/19/2024)   Hunger Vital Sign    Worried About Running Out of Food in the Last Year: Never true    Ran Out of Food in the Last Year: Never true  Transportation Needs: No Transportation Needs (03/19/2024)   PRAPARE - Administrator, Civil Service (Medical): No    Lack of Transportation (Non-Medical): No  Physical Activity: Not on file  Stress: Not on file  Social Connections: Not on file  Intimate Partner Violence: Not At Risk (03/19/2024)   Humiliation, Afraid, Rape, and Kick questionnaire    Fear of Current or Ex-Partner: No    Emotionally Abused: No    Physically Abused: No    Sexually Abused: No    Family History   Problem Relation Age of Onset   Diabetes Mother     ROS: Otherwise negative unless mentioned in HPI  Physical Examination  Vitals:   03/29/24 0557 03/29/24 0823  BP: (!) 155/80 (!) 161/83  Pulse:  (!) 101  Resp:  17  Temp:  98.4 F (36.9 C)  SpO2:  97%   Body mass index is 29.17 kg/m.  General:  WDWN in NAD Gait: Not observed HENT: WNL, normocephalic Pulmonary: normal non-labored breathing Cardiac: regular Abdomen: = soft, NT/ND, no masses Skin: without rashes Vascular Exam/Pulses: symmetrical radial pulses Extremities: without ischemic changes, without Gangrene , without cellulitis; without open wounds;  Musculoskeletal: no muscle wasting or atrophy  Neurologic: A&O X 3;  No focal weakness or paresthesias are detected; speech is fluent/normal Psychiatric:  The pt has Normal affect. Lymph:  Unremarkable  CBC    Component Value Date/Time   WBC 12.5 (H) 03/29/2024 0415   RBC 2.40 (L) 03/29/2024 0415   HGB 7.1 (L) 03/29/2024 0415   HGB 7.3 (L) 03/16/2024 0958   HGB 13.4 04/09/2017 0000   HCT 21.9 (L) 03/29/2024 0415   HCT 23.1 (L) 03/16/2024 0958   HCT 41 04/09/2017 0000   PLT 239 03/29/2024 0415   PLT 392 03/16/2024 0958   PLT 370 04/09/2017 0000   MCV 91.3 03/29/2024 0415   MCV 92 03/16/2024 0958   MCH 29.6 03/29/2024 0415   MCHC 32.4 03/29/2024 0415   RDW 13.4 03/29/2024 0415   RDW 14.6 03/16/2024 0958   LYMPHSABS 1.6 03/18/2024 2015   LYMPHSABS 1.7 03/16/2024 0958   MONOABS 0.8 03/18/2024 2015   EOSABS 0.2 03/18/2024 2015   EOSABS 0.3 03/16/2024 0958   BASOSABS 0.1 03/18/2024 2015   BASOSABS 0.1 03/16/2024 0958    BMET    Component Value Date/Time   NA 132 (L) 03/29/2024 0415   NA 135 03/16/2024 0958   K 4.3 03/29/2024 0415   CL 96 (L) 03/29/2024 0415   CO2 28 03/29/2024 0415   GLUCOSE 186 (H) 03/29/2024 0415   BUN 27 (H) 03/29/2024 0415   BUN 50 (H) 03/16/2024 0958   CREATININE 2.82 (H) 03/29/2024 0415   CREATININE 0.51 01/31/2021 1454    CALCIUM  7.8 (L) 03/29/2024 0415   GFRNONAA 20 (L) 03/29/2024 0415   GFRNONAA 117 01/31/2021 1454   GFRAA 136 01/31/2021 1454    COAGS: Lab Results  Component Value Date   INR 1.1 03/23/2024   INR 1.1 01/21/2021     Non-Invasive Vascular Imaging:   Vein mapping pending    ASSESSMENT/PLAN: This is a 48 y.o. female with end-stage renal disease now on hemodialysis being seen in consultation for evaluation for permanent dialysis access  Ms. Cullin is a 48 year old female who has  been started on hemodialysis.  She is being seen in consultation for evaluation for permanent access placement.  She has not had any prior access surgery.  She is right arm dominant with peripheral access placement in the left arm.  Vein mapping is pending.  If at all possible she would like to proceed with access placement prior to being discharged home.  Surgery was discussed in detail with the patient and she is agreeable to proceed.  On-call vascular surgeon Dr. Sheree will evaluate the patient later today and provide further treatment plans including timing of her surgery.  She will be scheduled for left arm AV fistula creation versus graft placement as well as catheter exchange for TDC.   Donnice Sender PA-C [facility] [phone]  I have independently interviewed and examined patient and agree with PA assessment and plan above.  Plan for left arm AV fistula versus graft with [facility] tomorrow in the OR.  She will be n.p.o. past midnight.  Lakesha Levinson C. Sheree, MD [facility] [place] Office: [phone] Pager: [phone]

## 2024-03-30 ENCOUNTER — Inpatient Hospital Stay (HOSPITAL_COMMUNITY): Payer: MEDICAID | Admitting: Anesthesiology

## 2024-03-30 ENCOUNTER — Other Ambulatory Visit: Payer: Self-pay

## 2024-03-30 ENCOUNTER — Encounter (HOSPITAL_COMMUNITY): Payer: Self-pay | Admitting: Internal Medicine

## 2024-03-30 ENCOUNTER — Encounter (HOSPITAL_COMMUNITY)
Admission: EM | Disposition: A | Discharge status: Home / Self Care | Payer: Self-pay | Source: Home / Self Care | Attending: Internal Medicine

## 2024-03-30 ENCOUNTER — Inpatient Hospital Stay (HOSPITAL_COMMUNITY): Payer: MEDICAID

## 2024-03-30 DIAGNOSIS — E1122 Type 2 diabetes mellitus with diabetic chronic kidney disease: Secondary | ICD-10-CM | POA: Insufficient documentation

## 2024-03-30 DIAGNOSIS — E1140 Type 2 diabetes mellitus with diabetic neuropathy, unspecified: Secondary | ICD-10-CM | POA: Insufficient documentation

## 2024-03-30 DIAGNOSIS — N186 End stage renal disease: Secondary | ICD-10-CM

## 2024-03-30 DIAGNOSIS — I120 Hypertensive chronic kidney disease with stage 5 chronic kidney disease or end stage renal disease: Secondary | ICD-10-CM

## 2024-03-30 DIAGNOSIS — Z992 Dependence on renal dialysis: Secondary | ICD-10-CM

## 2024-03-30 DIAGNOSIS — E7800 Pure hypercholesterolemia, unspecified: Secondary | ICD-10-CM | POA: Insufficient documentation

## 2024-03-30 HISTORY — PX: INSERTION OF DIALYSIS CATHETER: SHX1324

## 2024-03-30 HISTORY — PX: AV FISTULA PLACEMENT: SHX1204

## 2024-03-30 LAB — BASIC METABOLIC PANEL WITH GFR
Anion gap: 8 (ref 5–15)
BUN: 35 mg/dL — ABNORMAL HIGH (ref 6–20)
CO2: 26 mmol/L (ref 22–32)
Calcium: 7.9 mg/dL — ABNORMAL LOW (ref 8.9–10.3)
Chloride: 99 mmol/L (ref 98–111)
Creatinine, Ser: 3.71 mg/dL — ABNORMAL HIGH (ref 0.44–1.00)
GFR, Estimated: 14 mL/min — ABNORMAL LOW (ref >60)
Glucose, Bld: 159 mg/dL — ABNORMAL HIGH (ref 70–99)
Potassium: 4.1 mmol/L (ref 3.5–5.1)
Sodium: 133 mmol/L — ABNORMAL LOW (ref 135–145)

## 2024-03-30 LAB — TYPE AND SCREEN
ABO/RH(D): O POS
Antibody Screen: NEGATIVE
Unit division: 0

## 2024-03-30 LAB — CBC
HCT: 25.1 % — ABNORMAL LOW (ref 36.0–46.0)
Hemoglobin: 8.3 g/dL — ABNORMAL LOW (ref 12.0–15.0)
MCH: 30 pg (ref 26.0–34.0)
MCHC: 33.1 g/dL (ref 30.0–36.0)
MCV: 90.6 fL (ref 80.0–100.0)
Platelets: 242 K/uL (ref 150–400)
RBC: 2.77 MIL/uL — ABNORMAL LOW (ref 3.87–5.11)
RDW: 13.7 % (ref 11.5–15.5)
WBC: 13.8 K/uL — ABNORMAL HIGH (ref 4.0–10.5)
nRBC: 0 % (ref 0.0–0.2)

## 2024-03-30 LAB — GLUCOSE, CAPILLARY
Glucose-Capillary: 143 mg/dL — ABNORMAL HIGH (ref 70–99)
Glucose-Capillary: 106 mg/dL — ABNORMAL HIGH (ref 70–99)
Glucose-Capillary: 94 mg/dL (ref 70–99)
Glucose-Capillary: 108 mg/dL — ABNORMAL HIGH (ref 70–99)
Glucose-Capillary: 296 mg/dL — ABNORMAL HIGH (ref 70–99)

## 2024-03-30 LAB — POCT I-STAT, CHEM 8
BUN: 18 mg/dL (ref 6–20)
Calcium, Ion: 1.04 mmol/L — ABNORMAL LOW (ref 1.15–1.40)
Chloride: 97 mmol/L — ABNORMAL LOW (ref 98–111)
Creatinine, Ser: 2.2 mg/dL — ABNORMAL HIGH (ref 0.44–1.00)
Glucose, Bld: 99 mg/dL (ref 70–99)
HCT: 27 % — ABNORMAL LOW (ref 36.0–46.0)
Hemoglobin: 9.2 g/dL — ABNORMAL LOW (ref 12.0–15.0)
Potassium: 3.7 mmol/L (ref 3.5–5.1)
Sodium: 134 mmol/L — ABNORMAL LOW (ref 135–145)
TCO2: 26 mmol/L (ref 22–32)

## 2024-03-30 LAB — BPAM RBC
Blood Product Expiration Date: 202508302359
ISSUE DATE / TIME: 202508051314
Unit Type and Rh: 5100

## 2024-03-30 LAB — MAGNESIUM: Magnesium: 2.2 mg/dL (ref 1.7–2.4)

## 2024-03-30 SURGERY — ARTERIOVENOUS (AV) FISTULA CREATION
Anesthesia: General | Site: Chest | Laterality: Right

## 2024-03-30 MED ORDER — ONDANSETRON HCL 4 MG/2ML IJ SOLN
INTRAMUSCULAR | Status: AC
  Filled 2024-03-30: qty 2

## 2024-03-30 MED ORDER — HEPARIN SODIUM (PORCINE) 1000 UNIT/ML IJ SOLN
2600.0000 [IU] | Freq: Once | INTRAMUSCULAR | Status: AC
  Administered 2024-03-30: 2600 [IU] via INTRAVENOUS

## 2024-03-30 MED ORDER — CHLORHEXIDINE GLUCONATE 0.12 % MT SOLN: 15.0000 mL | Freq: Once | OROMUCOSAL | Status: AC

## 2024-03-30 MED ORDER — HEPARIN SODIUM (PORCINE) 1000 UNIT/ML IJ SOLN
INTRAMUSCULAR | Status: AC
Start: 2024-03-30 — End: 2024-03-30
  Filled 2024-03-30: qty 3

## 2024-03-30 MED ORDER — CEFAZOLIN SODIUM-DEXTROSE 2-3 GM-%(50ML) IV SOLR
INTRAVENOUS | Status: DC | PRN
  Administered 2024-03-30: 2 g via INTRAVENOUS

## 2024-03-30 MED ORDER — FENTANYL CITRATE (PF) 250 MCG/5ML IJ SOLN
INTRAMUSCULAR | Status: AC
  Filled 2024-03-30: qty 5

## 2024-03-30 MED ORDER — ALBUMIN HUMAN 5 % IV SOLN
INTRAVENOUS | Status: DC | PRN
Start: 2024-03-30 — End: 2024-03-30

## 2024-03-30 MED ORDER — ONDANSETRON HCL 4 MG/2ML IJ SOLN
INTRAMUSCULAR | Status: DC | PRN
  Administered 2024-03-30: 4 mg via INTRAVENOUS

## 2024-03-30 MED ORDER — HEPARIN SODIUM (PORCINE) 1000 UNIT/ML IJ SOLN
INTRAMUSCULAR | Status: AC
  Filled 2024-03-30: qty 10

## 2024-03-30 MED ORDER — HYDROMORPHONE HCL 1 MG/ML IJ SOLN: 0.2500 mg | INTRAMUSCULAR | Status: DC | PRN

## 2024-03-30 MED ORDER — ORAL CARE MOUTH RINSE: 15.0000 mL | Freq: Once | OROMUCOSAL | Status: AC

## 2024-03-30 MED ORDER — PROPOFOL 10 MG/ML IV BOLUS
INTRAVENOUS | Status: DC | PRN
  Administered 2024-03-30: 30 mg via INTRAVENOUS
  Administered 2024-03-30: 120 mg via INTRAVENOUS

## 2024-03-30 MED ORDER — DEXAMETHASONE SODIUM PHOSPHATE 10 MG/ML IJ SOLN
INTRAMUSCULAR | Status: DC | PRN
  Administered 2024-03-30: 10 mg via INTRAVENOUS

## 2024-03-30 MED ORDER — FENTANYL CITRATE (PF) 250 MCG/5ML IJ SOLN
INTRAMUSCULAR | Status: DC | PRN
  Administered 2024-03-30: 50 ug via INTRAVENOUS

## 2024-03-30 MED ORDER — MIDAZOLAM HCL 2 MG/2ML IJ SOLN
INTRAMUSCULAR | Status: AC
  Filled 2024-03-30: qty 2

## 2024-03-30 MED ORDER — LIDOCAINE-EPINEPHRINE (PF) 1 %-1:200000 IJ SOLN
INTRAMUSCULAR | Status: AC
  Filled 2024-03-30: qty 30

## 2024-03-30 MED ORDER — HYDROMORPHONE HCL 1 MG/ML IJ SOLN: 0.5000 mg | INTRAMUSCULAR | Status: DC | PRN

## 2024-03-30 MED ORDER — PHENYLEPHRINE HCL-NACL 20-0.9 MG/250ML-% IV SOLN
INTRAVENOUS | Status: DC | PRN
  Administered 2024-03-30: 40 ug/min via INTRAVENOUS

## 2024-03-30 MED ORDER — CHLORHEXIDINE GLUCONATE 0.12 % MT SOLN
OROMUCOSAL | Status: AC
  Administered 2024-03-30: 15 mL via OROMUCOSAL
  Filled 2024-03-30: qty 15

## 2024-03-30 MED ORDER — HEPARIN 6000 UNIT IRRIGATION SOLUTION
Status: AC
  Filled 2024-03-30: qty 500

## 2024-03-30 MED ORDER — PROPOFOL 10 MG/ML IV BOLUS
INTRAVENOUS | Status: AC
  Filled 2024-03-30: qty 20

## 2024-03-30 MED ORDER — LIDOCAINE 2% (20 MG/ML) 5 ML SYRINGE
INTRAMUSCULAR | Status: DC | PRN
  Administered 2024-03-30: 60 mg via INTRAVENOUS

## 2024-03-30 MED ORDER — 0.9 % SODIUM CHLORIDE (POUR BTL) OPTIME
TOPICAL | Status: DC | PRN
  Administered 2024-03-30: 1000 mL

## 2024-03-30 MED ORDER — HEPARIN SODIUM (PORCINE) 1000 UNIT/ML IJ SOLN
INTRAMUSCULAR | Status: DC | PRN
  Administered 2024-03-30: 3800 [IU]

## 2024-03-30 MED ORDER — OXYCODONE HCL 5 MG PO TABS: 5.0000 mg | ORAL_TABLET | ORAL | Status: DC | PRN

## 2024-03-30 MED ORDER — ACETAMINOPHEN 325 MG PO TABS
650.0000 mg | ORAL_TABLET | Freq: Four times a day (QID) | ORAL | Status: DC | PRN
  Administered 2024-03-31: 650 mg via ORAL
  Filled 2024-03-30: qty 2

## 2024-03-30 MED ORDER — MIDAZOLAM HCL 2 MG/2ML IJ SOLN
INTRAMUSCULAR | Status: DC | PRN
  Administered 2024-03-30: 2 mg via INTRAVENOUS

## 2024-03-30 MED ORDER — SODIUM CHLORIDE 0.9 % IV SOLN: INTRAVENOUS | Status: DC

## 2024-03-30 MED ORDER — HEPARIN 6000 UNIT IRRIGATION SOLUTION
Status: DC | PRN
  Administered 2024-03-30: 1

## 2024-03-30 MED ORDER — DEXAMETHASONE SODIUM PHOSPHATE 10 MG/ML IJ SOLN
INTRAMUSCULAR | Status: AC
  Filled 2024-03-30: qty 1

## 2024-03-30 MED ORDER — HEMOSTATIC AGENTS (NO CHARGE) OPTIME
TOPICAL | Status: DC | PRN
Start: 2024-03-30 — End: 2024-03-30
  Administered 2024-03-30: 1 via TOPICAL

## 2024-03-30 MED ORDER — LIDOCAINE 2% (20 MG/ML) 5 ML SYRINGE
INTRAMUSCULAR | Status: AC
  Filled 2024-03-30: qty 5

## 2024-03-30 SURGICAL SUPPLY — 55 items
ARMBAND PINK RESTRICT EXTREMIT (MISCELLANEOUS) ×6 IMPLANT
BAG COUNTER SPONGE SURGICOUNT (BAG) ×3 IMPLANT
BAG DECANTER FOR FLEXI CONT (MISCELLANEOUS) ×3 IMPLANT
BIOPATCH RED 1 DISK 7.0 (GAUZE/BANDAGES/DRESSINGS) ×3 IMPLANT
CANISTER SUCTION 3000ML PPV (SUCTIONS) ×3 IMPLANT
CATH PALINDROME-P 19CM W/VT (CATHETERS) IMPLANT
CATH PALINDROME-P 28CM W/VT (CATHETERS) IMPLANT
CATH PALINDROME-SP 14.5FX23 (CATHETERS) ×1 IMPLANT
CLIP TI MEDIUM 6 (CLIP) ×3 IMPLANT
CLIP TI WIDE RED SMALL 6 (CLIP) ×3 IMPLANT
COVER PROBE W GEL 5X96 (DRAPES) ×3 IMPLANT
COVER SURGICAL LIGHT HANDLE (MISCELLANEOUS) ×3 IMPLANT
DERMABOND ADVANCED .7 DNX12 (GAUZE/BANDAGES/DRESSINGS) ×3 IMPLANT
DRAPE C-ARM 42X72 X-RAY (DRAPES) ×3 IMPLANT
DRAPE CHEST BREAST 15X10 FENES (DRAPES) ×3 IMPLANT
DRSG COVADERM 4X6 (GAUZE/BANDAGES/DRESSINGS) ×1 IMPLANT
ELECT CAUTERY BLADE 6.4 (BLADE) ×1 IMPLANT
ELECTRODE REM PT RTRN 9FT ADLT (ELECTROSURGICAL) ×3 IMPLANT
GAUZE 4X4 16PLY ~~LOC~~+RFID DBL (SPONGE) ×3 IMPLANT
GAUZE SPONGE 4X4 12PLY STRL (GAUZE/BANDAGES/DRESSINGS) ×1 IMPLANT
GLOVE SURG SS PI 7.5 STRL IVOR (GLOVE) ×9 IMPLANT
GOWN STRL REUS W/ TWL LRG LVL3 (GOWN DISPOSABLE) ×6 IMPLANT
GOWN STRL REUS W/ TWL XL LVL3 (GOWN DISPOSABLE) ×3 IMPLANT
HEMOSTAT SNOW SURGICEL 2X4 (HEMOSTASIS) IMPLANT
HIBICLENS CHG 4% 4OZ BTL (MISCELLANEOUS) ×1 IMPLANT
KIT BASIN OR (CUSTOM PROCEDURE TRAY) ×3 IMPLANT
KIT PALINDROME-P 55CM (CATHETERS) IMPLANT
KIT TURNOVER KIT B (KITS) ×3 IMPLANT
MARKER SKIN DUAL TIP RULER LAB (MISCELLANEOUS) ×1 IMPLANT
NDL 18GX1X1/2 (RX/OR ONLY) (NEEDLE) ×2 IMPLANT
NDL HYPO 25GX1X1/2 BEV (NEEDLE) ×2 IMPLANT
NEEDLE 18GX1X1/2 (RX/OR ONLY) (NEEDLE) ×3 IMPLANT
NEEDLE HYPO 25GX1X1/2 BEV (NEEDLE) ×3 IMPLANT
NS IRRIG 1000ML POUR BTL (IV SOLUTION) ×3 IMPLANT
PACK CV ACCESS (CUSTOM PROCEDURE TRAY) ×3 IMPLANT
PACK SRG BSC III STRL LF ECLPS (CUSTOM PROCEDURE TRAY) ×3 IMPLANT
PAD ARMBOARD POSITIONER FOAM (MISCELLANEOUS) ×6 IMPLANT
PENCIL BUTTON HOLSTER BLD 10FT (ELECTRODE) ×1 IMPLANT
SLING ARM FOAM STRAP LRG (SOFTGOODS) IMPLANT
SOAP 2 % CHG 4 OZ (WOUND CARE) ×3 IMPLANT
SURGIFLO W/THROMBIN 8M KIT (HEMOSTASIS) ×1 IMPLANT
SUT ETHILON 3 0 PS 1 (SUTURE) ×3 IMPLANT
SUT PROLENE 6 0 BV (SUTURE) ×2 IMPLANT
SUT PROLENE 6 0 CC (SUTURE) ×3 IMPLANT
SUT VIC AB 3-0 SH 27X BRD (SUTURE) ×3 IMPLANT
SUT VIC AB 4-0 PS2 18 (SUTURE) ×3 IMPLANT
SYR 10ML LL (SYRINGE) ×3 IMPLANT
SYR 20ML LL LF (SYRINGE) ×6 IMPLANT
SYR 5ML LL (SYRINGE) ×3 IMPLANT
SYR CONTROL 10ML LL (SYRINGE) ×3 IMPLANT
TOWEL GREEN STERILE (TOWEL DISPOSABLE) ×6 IMPLANT
TOWEL GREEN STERILE FF (TOWEL DISPOSABLE) ×3 IMPLANT
UNDERPAD 30X36 HEAVY ABSORB (UNDERPADS AND DIAPERS) ×3 IMPLANT
WATER STERILE IRR 1000ML POUR (IV SOLUTION) ×3 IMPLANT
WIRE BENTSON .035X145CM (WIRE) ×1 IMPLANT

## 2024-03-30 NOTE — Progress Notes (Signed)
   03/30/24 1139  Vitals  Temp 98.4 F (36.9 C)  Pulse Rate 96  Resp 16  BP (!) 174/78  SpO2 100 %  O2 Device Room Air  Weight 64.6 kg  Type of Weight Post-Dialysis  Oxygen Therapy  Patient Activity (if Appropriate) In bed  Post Treatment  Dialyzer Clearance Lightly streaked  Hemodialysis Intake (mL) 0 mL  Liters Processed 40.1  Fluid Removed (mL) 1400 mL  Tolerated HD Treatment Yes  Post-Hemodialysis Comments Pt. tolerated HD treatment without difficulties. Report call to 6N Bedside RN. Admin medication per. order.   Received patient in bed to unit.  Alert and oriented.  Informed consent signed and in chart.   TX duration: 3  Patient tolerated well.  Transported back to the room  Alert, without acute distress.  Hand-off given to patient's nurse.   Access used: Yes Access issues: No  Total UF removed: 1400 Medication(s) given: See MAR Post HD VS: See Above Grid Post HD weight: 64.6 kg   Zebedee DELENA [facility]

## 2024-03-30 NOTE — Progress Notes (Signed)
 Pt has been financially cleared and accepted to [facility], TTS, chair time 6:15.   When contacted, clinic stated she would need to come to clinic 1 day prior to 1st treatment for paperwork.   Stated that if pt were to d/c tomorrow or Friday morning, she could come to clinic between 9am-2:30pm Friday for paperwork and begin first treatment Saturday.   Will meet with pt tomorrow morning to discuss further.   Lavanda Mustaf Antonacci Dialysis Navigator [phone]

## 2024-03-30 NOTE — Progress Notes (Signed)
 Called for report on patient. Rn to call back at [phone]. Transport sent for patient.

## 2024-03-30 NOTE — Progress Notes (Signed)
 Admit: 03/18/2024 LOS: 12  7F new ESRD from [facility]  Subjective:  VVS for LUE AVF/G and TDC today About to start HD CLIP in process Pt w/o acute c/o today   08/05 0701 - 08/06 0700 In: 1229.5 [P.O.:510; I.V.:300; Blood:300; IV Piggyback:119.5] Out: 400 [Urine:400]  Filed Weights   03/28/24 0750 03/28/24 1116 03/30/24 0824  Weight: 66.6 kg 65.5 kg 66.2 kg    Scheduled Meds:  Chlorhexidine  Gluconate Cloth  6 each Topical Q0600   cholecalciferol   5,000 Units Oral Q M,W,F   darbepoetin (ARANESP ) injection - DIALYSIS  100 mcg Subcutaneous Q Sun-1800   feeding supplement (NEPRO CARB STEADY)  237 mL Oral BID BM   hydrALAZINE   50 mg Oral Q8H   insulin  aspart  0-9 Units Subcutaneous TID WC   insulin  glargine-yfgn  13 Units Subcutaneous Daily   labetalol   200 mg Oral BID   megestrol   40 mg Oral Daily   pantoprazole   40 mg Oral BID AC   polyethylene glycol  17 g Oral BID   rosuvastatin   10 mg Oral Daily   senna  1 tablet Oral Daily   sodium chloride  flush  10-40 mL Intracatheter Q12H   vitamin B-12  500 mcg Oral Daily   Continuous Infusions:  iron  sucrose 200 mg (03/29/24 1602)   PRN Meds:.acetaminophen , guaiFENesin , hydrALAZINE , hydrOXYzine , ipratropium-albuterol , labetalol , ondansetron  (ZOFRAN ) IV, ondansetron  **OR** [DISCONTINUED] ondansetron  (ZOFRAN ) IV, prochlorperazine , senna-docusate, traZODone   Current Labs: reviewed   Physical Exam:  Blood pressure (!) 173/83, pulse 94, temperature (!) 97.3 F (36.3 C), resp. rate 18, height 4' 11 (1.499 m), weight 66.2 kg, last menstrual period 03/04/2024, SpO2 98%. 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 Morphine should not be used.  Baclofen should be avoided Avoid oral sodium phosphate  and magnesium citrate based laxatives / bowel preps    Bernardino Gasman MD 03/30/2024, 8:26 AM  Recent Labs  Lab 03/24/24 9358 03/25/24 0703 03/25/24 1158 03/26/24 0743 03/28/24 0737 03/29/24 0415 03/30/24 0308  NA 131*   < > 130*   < > 131* 132* 133*  K 4.2   < > 4.8   < > 5.1 4.3 4.1  CL 97*   < > 99   < > 97* 96* 99  CO2 23   < > 23   < > 24 28 26   GLUCOSE 197*   < > 199*   < > 159* 186* 159*  BUN 27*   < > 36*   < > 48* 27* 35*  CREATININE 3.25*   < > 3.95*   < > 4.13* 2.82* 3.71*  CALCIUM  7.7*   < > 7.8*   < > 8.4* 7.8* 7.9*  PHOS 4.6  --  5.3*  --  4.6  --   --    < > = values in this interval not displayed.  Recent Labs  Lab 03/28/24 0737 03/29/24 0415 03/30/24 0308  WBC 14.7* 12.5* 13.8*  HGB 7.9* 7.1* 8.3*  HCT 24.7* 21.9* 25.1*  MCV 91.1 91.3 90.6  PLT 241 239 242

## 2024-03-30 NOTE — Anesthesia Preprocedure Evaluation (Addendum)
 Anesthesia Evaluation  Patient identified by MRN, date of birth, ID band Patient awake    Reviewed: Allergy & Precautions, H&P , NPO status , Patient's Chart, lab work & pertinent test results  Airway Mallampati: II  TM Distance: >3 FB Neck ROM: Full    Dental no notable dental hx. (+) Dental Advisory Given, Chipped   Pulmonary neg pulmonary ROS   Pulmonary exam normal breath sounds clear to auscultation       Cardiovascular hypertension, Pt. on medications  Rhythm:Regular Rate:Normal     Neuro/Psych negative neurological ROS  negative psych ROS   GI/Hepatic negative GI ROS, Neg liver ROS,,,  Endo/Other  diabetes, Insulin  Dependent    Renal/GU ESRF and DialysisRenal disease  negative genitourinary   Musculoskeletal   Abdominal   Peds  Hematology  (+) Blood dyscrasia, anemia   Anesthesia Other Findings   Reproductive/Obstetrics negative OB ROS                              Anesthesia Physical Anesthesia Plan  ASA: 3  Anesthesia Plan: General   Post-op Pain Management: Tylenol  PO (pre-op )*   Induction: Intravenous  PONV Risk Score and Plan: 4 or greater and Ondansetron , Dexamethasone  and Midazolam   Airway Management Planned: LMA  Additional Equipment:   Intra-op Plan:   Post-operative Plan: Extubation in OR  Informed Consent: I have reviewed the patients History and Physical, chart, labs and discussed the procedure including the risks, benefits and alternatives for the proposed anesthesia with the patient or authorized representative who has indicated his/her understanding and acceptance.     Dental advisory given and Interpreter used for interview  Plan Discussed with: CRNA  Anesthesia Plan Comments:          Anesthesia Quick Evaluation

## 2024-03-30 NOTE — Progress Notes (Incomplete)
 PAtient with no documented output bladder scan showed 206 ml. Dr Caleen notified new order placed to bladder scan

## 2024-03-30 NOTE — Op Note (Signed)
 Patient name: Michele Morales MRN: [id number] DOB: 06-Nov-1975 Sex: female  03/30/2024 Pre-operative Diagnosis: ESRD Post-operative diagnosis:  Same Surgeon:  Malvina New Assistants:  EMERSON Holster, PA Procedure:   #1: Removal of temporary dialysis catheter   #2: Placement of a tunneled 23 cm tip to cuff dialysis catheter under fluoroscopic visualization   #3: Left brachiobasilic fistula Anesthesia:  General Blood Loss:  minmal Specimens:  none  Findings: Existing temporary right internal jugular vein dialysis catheter was removed and exchanged for a tunneled 23 cm catheter.  A left brachial basilic fistula was created.  She had a 3 mm disease-free artery and a 3-4 mm basilic vein  Indications: This is a 48 year old female, Spanish only speaking who is a new start dialysis.  She comes in today for tunneled catheter and access in the left arm.  I discussed the details of the procedure with a Spanish interpreter.  Procedure:  The patient was identified in the holding area and taken to [facility] OR ROOM 16  The patient was then placed supine on the table. general anesthesia was administered.  The patient was prepped and draped in the usual sterile fashion.  A time out was called and antibiotics were administered.  A Bentson wire was inserted through the existing right IJ temporary catheter into the right atrium.  The catheter was removed and a peel-away sheath was placed under fluoroscopic visualization.  I then selected a skin exit site and made a skin nick below the clavicle on the right.  A subcutaneous tunnel was created and a 23 cm catheter was brought through the tunnel up to the neck incision.  This was fed through the peel-away sheath and positioned under fluoroscopic visualization.  The catheter tip was at the cavoatrial junction.  There were no kinks within the catheter.  Both ports flushed and aspirated without difficulty.  The catheter was sutured to the skin with 3-0 nylon.  The neck incision was  closed with 4-0 Vicryl and Dermabond was placed.  Attention was then turned towards the left arm.  The basilic vein was evaluated with ultrasound and felt to be adequate for fistula creation.  An oblique incision was made just proximal to the antecubital crease.  I first dissected out the brachial artery which was a 3 mm disease-free artery.  I then dissected out the basilic vein which was a 3-4 mm vein.  Side branches were ligated between silk ties.  The vein was marked for orientation and then ligated distally with a 3-0 silk tie.  The vein distended nicely with heparinized saline.  The artery was then occluded with baby Gregory clamps and a #11 blade was used to make an arteriotomy which was extended longitudinally with Potts scissors.  The vein was then cut the appropriate length and spatulated to fit the size of the arteriotomy.  A running anastomosis was performed with 6-0 Prolene.  Prior to completion, the appropriate flushing maneuvers were performed and the anastomosis was completed.  The clamps were released.  There was an excellent thrill within the catheter.  I inspected the course of the vein to make sure there were no kinks.  The patient had a palpable radial pulse.  The wound was irrigated.  Hemostasis was achieved.  Surgiflo was placed in the wound.  The incision was closed by reapproximating the deep tissue with 3-0 Vicryl and the skin with 3-0 Vicryl followed by Dermabond.  The patient was successfully extubated taken recovery in stable condition.  There were  no immediate complications.   Disposition: To PACU stable.   ALONSO Malvina New, M.D., [facility] [place] Office: [phone] Pager:  [phone]

## 2024-03-30 NOTE — Transfer of Care (Signed)
 Immediate Anesthesia Transfer of Care Note  Patient: Michele Morales  Procedure(s) Performed: BRACHIOBASILIC ARTERIOVENOUS (AV) FISTULA CREATION (Left: Arm Upper) INSERTION OF DIALYSIS CATHETER USING 23CM PALINDROME DUAL LUMEN CATHETER (Right: Chest)  Patient Location: PACU  Anesthesia Type:General  Level of Consciousness: drowsy  Airway & Oxygen Therapy: Patient Spontanous Breathing and Patient connected to face mask oxygen  Post-op Assessment: Report given to RN and Post -op Vital signs reviewed and stable  Post vital signs: Reviewed and stable  Last Vitals:  Vitals Value Taken Time  BP 164/78 03/30/24 17:00  Temp    Pulse 87 03/30/24 17:03  Resp 13 03/30/24 17:03  SpO2 99 % 03/30/24 17:03  Vitals shown include unfiled device data.  Last Pain:  Vitals:   03/30/24 1300  TempSrc:   PainSc: 0-No pain      Patients Stated Pain Goal: 2 (03/30/24 1300)  Complications: There were no known notable events for this encounter.

## 2024-03-30 NOTE — Interval H&P Note (Signed)
 History and Physical Interval Note:  03/30/2024 1:36 PM  Michele Morales  has presented today for surgery, with the diagnosis of ESRD.  The various methods of treatment have been discussed with the patient and family. After consideration of risks, benefits and other options for treatment, the patient has consented to  Procedure(s): ARTERIOVENOUS (AV) FISTULA CREATION (Left) INSERTION, GRAFT, ARTERIOVENOUS, UPPER EXTREMITY (Left) INSERTION OF DIALYSIS CATHETER (N/A) as a surgical intervention.  The patient's history has been reviewed, patient examined, no change in status, stable for surgery.  I have reviewed the patient's chart and labs.  Questions were answered to the patient's satisfaction.     Malvina New

## 2024-03-30 NOTE — Progress Notes (Signed)
 PROGRESS NOTE    Michele Morales  FMW:[id number] DOB: 1975-12-04 DOA: 03/18/2024 PCP: Celestia Rosaline SQUIBB, NP    Brief Narrative:  48 year old with history of insulin -dependent diabetes, HTN, HLD, diabetic neuropathy comes to the ED with referral for PCP due to abnormal lab work.  Creatinine noted to be 2.5, hemoglobin 6.5.  Upon admission she was given unit PRBC transfusion, hemoglobin stabilized around 8.5.  To help with menstruation/menorrhagia she was started on Megace  per OB recommendations.  Pelvic ultrasound showed adenomyosis. Patient has continued to rise in creatinine therefore eventually temporary HD catheter was placed with plans for renal biopsy.  Antihypertensives were adjusted as mentioned below.  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  Neuro was consulted for left arm AV fistula versus graft placement along with tunneled catheter in the OR.   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  Seen by nephrology.  Temporary HD catheter placed 7/29.  Nephrology team following. HD sessions per nephro. Vascular consulted for perm access placement Renal Biopsy: Advanced diabetic glomerular nephrosis, stage IV  Acute on chronic anemia, symptomatic: Menorrhagia, related to adenomyosis -Baseline hemoglobin 10.0, admission hemoglobin 6.5.  Likely from heavy menstruation.  Received 3 units of PRBC.  B12, folate are normal.  Iron  studies overall looks okay, low ferritin.  Hemoglobin 8.3 -TSH 3.5, pelvic ultrasound, adenomyosis - Prior provider discussed case with Dr. Barbra who recommended Megace   40 mg daily, Recommend outpatient follow-up with GYN   E. coli urinary tract infection Completed 5 days of Rocephin  7/30   Insulin -dependent diabetes: -Sliding scale and Accu-Cheks   Constipation:  -Aggressive bowel regimen.    Essential hypertension, uncontrolled Improved.  Continue hydralazine  50 mg every 8 hours, labetalol  200 mg twice daily.  Will continue to adjust as needed.   DVT prophylaxis: SCD Code Status: Full code Family Communication: Disposition Plan:  Status is: Inpatient Discharge once cleared by Vasc & Nephro   Subjective: No complaints in HD.  Examination:  General exam: Appears calm and comfortable  Respiratory system: Clear to auscultation. Respiratory effort normal. Cardiovascular system: S1 & S2 heard, RRR. No JVD, murmurs, rubs, gallops or clicks. No pedal edema. Gastrointestinal system: Abdomen is nondistended, soft and nontender. No organomegaly or masses felt. Normal bowel sounds heard. Central nervous system: Alert and oriented. No focal neurological deficits. Extremities: Symmetric 5 x 5 power. Skin: No rashes, lesions or ulcers Psychiatry: Judgement and insight appear normal. Mood & affect appropriate.                Diet Orders (From admission, onward)     Start     Ordered   03/30/24 0001  Diet NPO time specified  Diet effective midnight        03/29/24 1304            Objective: Vitals:   03/30/24 0900 03/30/24 0930 03/30/24 1000 03/30/24 1030  BP: (!) 169/82 (!) 178/91 (!) 185/87 (!) 184/88  Pulse: 94 95 96 95  Resp: 15 13 15 15   Temp:      TempSrc:      SpO2: 97% 98% 96% 97%  Weight:  Height:        Intake/Output Summary (Last 24 hours) at 03/30/2024 1047 Last data filed at 03/30/2024 0400 Gross per 24 hour  Intake 1109.52 ml  Output 200 ml  Net 909.52 ml   Filed Weights   03/28/24 0750 03/28/24 1116 03/30/24 0824  Weight: 66.6 kg 65.5 kg 66.2 kg    Scheduled Meds:  Chlorhexidine  Gluconate Cloth  6  each Topical Q0600   cholecalciferol   5,000 Units Oral Q M,W,F   darbepoetin (ARANESP ) injection - DIALYSIS  100 mcg Subcutaneous Q Sun-1800   feeding supplement (NEPRO CARB STEADY)  237 mL Oral BID BM   hydrALAZINE   50 mg Oral Q8H   insulin  aspart  0-9 Units Subcutaneous TID WC   insulin  glargine-yfgn  13 Units Subcutaneous Daily   labetalol   200 mg Oral BID   megestrol   40 mg Oral Daily   pantoprazole   40 mg Oral BID AC   polyethylene glycol  17 g Oral BID   rosuvastatin   10 mg Oral Daily   senna  1 tablet Oral Daily   sodium chloride  flush  10-40 mL Intracatheter Q12H   vitamin B-12  500 mcg Oral Daily   Continuous Infusions:  iron  sucrose 200 mg (03/29/24 1602)    Nutritional status     Body mass index is 29.48 kg/m.  Data Reviewed:   CBC: Recent Labs  Lab 03/26/24 0743 03/27/24 0640 03/28/24 0737 03/29/24 0415 03/30/24 0308  WBC 11.7* 14.5* 14.7* 12.5* 13.8*  HGB 7.5* 7.7* 7.9* 7.1* 8.3*  HCT 22.2* 23.4* 24.7* 21.9* 25.1*  MCV 88.4 90.0 91.1 91.3 90.6  PLT 199 230 241 239 242   Basic Metabolic Panel: Recent Labs  Lab 03/24/24 0641 03/25/24 0703 03/25/24 1158 03/26/24 0743 03/27/24 0640 03/28/24 0737 03/29/24 0415 03/30/24 0308  NA 131*   < > 130* 132* 133* 131* 132* 133*  K 4.2   < > 4.8 4.6 5.6* 5.1 4.3 4.1  CL 97*   < > 99 97* 97* 97* 96* 99  CO2 23   < > 23 25 26 24 28 26   GLUCOSE 197*   < > 199* 150* 129* 159* 186* 159*  BUN 27*   < > 36* 21* 36* 48* 27* 35*  CREATININE 3.25*   < > 3.95* 3.21* 3.91* 4.13* 2.82* 3.71*  CALCIUM  7.7*   < > 7.8* 7.7* 7.8* 8.4* 7.8* 7.9*  MG 1.9   < >  --  1.8 1.9 2.0 1.9 2.2  PHOS 4.6  --  5.3*  --   --  4.6  --   --    < > = values in this interval not displayed.   GFR: Estimated Creatinine Clearance: 15.3 mL/min (A) (by C-G formula based on SCr of 3.71 mg/dL (H)). Liver Function Tests: Recent Labs  Lab 03/25/24 1158 03/28/24 0737  ALBUMIN  2.5* 2.8*   No results for input(s): LIPASE, AMYLASE in the  last 168 hours. No results for input(s): AMMONIA in the last 168 hours. Coagulation Profile: No results for input(s): INR, PROTIME in the last 168 hours. Cardiac Enzymes: No results for input(s): CKTOTAL, CKMB, CKMBINDEX, TROPONINI in the last 168 hours. BNP (last 3 results) No results for input(s): PROBNP in the last 8760 hours. HbA1C: No results for input(s): HGBA1C in the last 72 hours. CBG: Recent Labs  Lab 03/29/24 0821 03/29/24 1143 03/29/24 1752 03/29/24 2039 03/30/24 0759  GLUCAP 193* 175* 235* 170* 143*   Lipid Profile: No results for  input(s): CHOL, HDL, LDLCALC, TRIG, CHOLHDL, LDLDIRECT in the last 72 hours. Thyroid Function Tests: No results for input(s): TSH, T4TOTAL, FREET4, T3FREE, THYROIDAB in the last 72 hours. Anemia Panel: No results for input(s): VITAMINB12, FOLATE, FERRITIN, TIBC, IRON , RETICCTPCT in the last 72 hours. Sepsis Labs: No results for input(s): PROCALCITON, LATICACIDVEN in the last 168 hours.  Recent Results (from the past 240 hours)  Surgical PCR screen     Status: None   Collection Time: 03/29/24  7:55 PM   Specimen: Nasal Mucosa; Nasal Swab  Result Value Ref Range Status   MRSA, PCR NEGATIVE NEGATIVE Final   Staphylococcus aureus NEGATIVE NEGATIVE Final    Comment: (NOTE) The Xpert SA Assay (FDA approved for NASAL specimens in patients 45 years of age and older), is one component of a comprehensive surveillance program. It is not intended to diagnose infection nor to guide or monitor treatment. Performed at [facility], [address], [place], [place] [address]          Radiology Studies: VAS US  UPPER EXT VEIN MAPPING (PRE-OP  AVF) Result Date: 03/29/2024 UPPER EXTREMITY VEIN MAPPING Patient Name:  Michele Morales  Date of Exam:   03/29/2024 Medical Rec #: [id number]        Accession #:    [id number] Date of Birth: 1976/01/22        Patient Gender: F Patient Age:   40  years Exam Location:  [facility] Procedure:      VAS US  UPPER EXT VEIN MAPPING (PRE-OP  AVF) Referring Phys: DONNICE SENDER --------------------------------------------------------------------------------  Indications: Pre op. Limitations: IVs Performing Technologist: Elmarie Lindau, RVT  Examination Guidelines: A complete evaluation includes B-mode imaging, spectral Doppler, color Doppler, and power Doppler as needed of all accessible portions of each vessel. Bilateral testing is considered an integral part of a complete examination. Limited examinations for reoccurring indications may be performed as noted. +-----------------+-------------+----------+---------------------+ Right Cephalic   Diameter (cm)Depth (cm)      Findings        +-----------------+-------------+----------+---------------------+ Prox upper arm       0.45        0.61                         +-----------------+-------------+----------+---------------------+ Mid upper arm        0.35        0.68                         +-----------------+-------------+----------+---------------------+ Dist upper arm       0.36        0.46                         +-----------------+-------------+----------+---------------------+ Antecubital fossa    0.49        0.30                         +-----------------+-------------+----------+---------------------+ Prox forearm         0.30        0.39                         +-----------------+-------------+----------+---------------------+ Mid forearm                                   thrombus        +-----------------+-------------+----------+---------------------+  Dist forearm                            IV and not visualized +-----------------+-------------+----------+---------------------+ Wrist                                            IV           +-----------------+-------------+----------+---------------------+  +-----------------+-------------+----------+---------+ Right Basilic    Diameter (cm)Depth (cm)Findings  +-----------------+-------------+----------+---------+ Prox upper arm       0.76        0.78             +-----------------+-------------+----------+---------+ Mid upper arm        0.34        1.15             +-----------------+-------------+----------+---------+ Dist upper arm       0.52        0.94             +-----------------+-------------+----------+---------+ Antecubital fossa    0.24        0.25             +-----------------+-------------+----------+---------+ Prox forearm         0.24        0.18             +-----------------+-------------+----------+---------+ Mid forearm          0.23        0.21   branching +-----------------+-------------+----------+---------+ +-----------------+-------------+----------+--------+ Left Cephalic    Diameter (cm)Depth (cm)Findings +-----------------+-------------+----------+--------+ Prox upper arm       0.35        0.71            +-----------------+-------------+----------+--------+ Mid upper arm        0.31        0.47            +-----------------+-------------+----------+--------+ Dist upper arm       0.12        0.16            +-----------------+-------------+----------+--------+ Antecubital fossa    0.26        0.36            +-----------------+-------------+----------+--------+ Prox forearm         0.25        0.35            +-----------------+-------------+----------+--------+ Mid forearm          0.26        0.59            +-----------------+-------------+----------+--------+ Dist forearm         0.25        0.43            +-----------------+-------------+----------+--------+ +--------------+-------------+----------+--------+ Left Basilic  Diameter (cm)Depth (cm)Findings +--------------+-------------+----------+--------+ Prox upper arm    0.73        1.22             +--------------+-------------+----------+--------+ Mid upper arm     0.25        0.99            +--------------+-------------+----------+--------+ Dist upper arm    0.26        0.61            +--------------+-------------+----------+--------+ Prox forearm      0.25  0.27            +--------------+-------------+----------+--------+ Mid forearm       0.26        0.27            +--------------+-------------+----------+--------+ Distal forearm    0.29        0.24            +--------------+-------------+----------+--------+ Tech error: images [id number] is annotated right, however it is the left Summary: Right: Patent right cephalic from upper arm to prox forearm.        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        The cephalic measures from 0.30 cm to 0.49 cm.        Patent right basilic vein with no evidence of intraluminal        material.        The basilic meaures from 0.23 cm to 0.76 cm. Left: Patent left cephalic and basilic veins with no evidence of       intraluminal material.       The cephalic measures from 0.12 cm to 0.35 cm.       The basilic meaures from 0.25 cm to 0.73 cm. *See table(s) above for measurements and observations.  Diagnosing physician:    Preliminary            LOS: 12 days   Time spent= 35 mins    Burgess JAYSON Dare, MD [facility]  If 7PM-7AM, please contact night-coverage  03/30/2024, 10:47 AM

## 2024-03-31 ENCOUNTER — Encounter (HOSPITAL_COMMUNITY): Payer: Self-pay | Admitting: Surgery

## 2024-03-31 ENCOUNTER — Other Ambulatory Visit (HOSPITAL_COMMUNITY): Payer: Self-pay

## 2024-03-31 ENCOUNTER — Other Ambulatory Visit: Payer: Self-pay

## 2024-03-31 DIAGNOSIS — Z95828 Presence of other vascular implants and grafts: Secondary | ICD-10-CM

## 2024-03-31 LAB — BASIC METABOLIC PANEL WITH GFR
Anion gap: 9 (ref 5–15)
BUN: 33 mg/dL — ABNORMAL HIGH (ref 6–20)
CO2: 24 mmol/L (ref 22–32)
Calcium: 7.7 mg/dL — ABNORMAL LOW (ref 8.9–10.3)
Chloride: 97 mmol/L — ABNORMAL LOW (ref 98–111)
Creatinine, Ser: 3.92 mg/dL — ABNORMAL HIGH (ref 0.44–1.00)
GFR, Estimated: 13 mL/min — ABNORMAL LOW (ref >60)
Glucose, Bld: 289 mg/dL — ABNORMAL HIGH (ref 70–99)
Potassium: 4.2 mmol/L (ref 3.5–5.1)
Sodium: 130 mmol/L — ABNORMAL LOW (ref 135–145)

## 2024-03-31 LAB — CBC
HCT: 25.9 % — ABNORMAL LOW (ref 36.0–46.0)
Hemoglobin: 8.6 g/dL — ABNORMAL LOW (ref 12.0–15.0)
MCH: 30.1 pg (ref 26.0–34.0)
MCHC: 33.2 g/dL (ref 30.0–36.0)
MCV: 90.6 fL (ref 80.0–100.0)
Platelets: 302 K/uL (ref 150–400)
RBC: 2.86 MIL/uL — ABNORMAL LOW (ref 3.87–5.11)
RDW: 13.9 % (ref 11.5–15.5)
WBC: 14.9 K/uL — ABNORMAL HIGH (ref 4.0–10.5)
nRBC: 0 % (ref 0.0–0.2)

## 2024-03-31 LAB — GLUCOSE, CAPILLARY: Glucose-Capillary: 278 mg/dL — ABNORMAL HIGH (ref 70–99)

## 2024-03-31 MED ORDER — HYDROXYZINE HCL 25 MG PO TABS
25.0000 mg | ORAL_TABLET | Freq: Three times a day (TID) | ORAL | 0 refills | Status: DC | PRN
  Filled 2024-03-31 (×2): qty 30, 10d supply, fill #0

## 2024-03-31 MED ORDER — LABETALOL HCL 300 MG PO TABS
300.0000 mg | ORAL_TABLET | Freq: Two times a day (BID) | ORAL | 0 refills | Status: AC
  Filled 2024-03-31 (×2): qty 60, 30d supply, fill #0

## 2024-03-31 MED ORDER — PANTOPRAZOLE SODIUM 40 MG PO TBEC
40.0000 mg | DELAYED_RELEASE_TABLET | Freq: Two times a day (BID) | ORAL | 0 refills | Status: AC
  Filled 2024-03-31 (×2): qty 60, 30d supply, fill #0

## 2024-03-31 MED ORDER — LABETALOL HCL 300 MG PO TABS
300.0000 mg | ORAL_TABLET | Freq: Two times a day (BID) | ORAL | Status: DC
  Administered 2024-03-31: 300 mg via ORAL
  Filled 2024-03-31: qty 1

## 2024-03-31 MED ORDER — BASAGLAR KWIKPEN 100 UNIT/ML ~~LOC~~ SOPN
13.0000 [IU] | PEN_INJECTOR | Freq: Every day | SUBCUTANEOUS | 0 refills | Status: DC
Start: 2024-03-31 — End: 2024-05-02
  Filled 2024-03-31: qty 3, 23d supply, fill #0

## 2024-03-31 MED ORDER — SENNA 8.6 MG PO TABS
1.0000 | ORAL_TABLET | Freq: Every day | ORAL | 0 refills | Status: AC
  Filled 2024-03-31 (×2): qty 120, 120d supply, fill #0

## 2024-03-31 MED ORDER — DARBEPOETIN ALFA 100 MCG/0.5ML IJ SOSY: 100.0000 ug | PREFILLED_SYRINGE | INTRAMUSCULAR | Status: AC

## 2024-03-31 MED ORDER — POLYETHYLENE GLYCOL 3350 17 GM/SCOOP PO POWD
17.0000 g | Freq: Every day | ORAL | 0 refills | Status: AC | PRN
  Filled 2024-03-31 (×2): qty 238, 14d supply, fill #0

## 2024-03-31 MED ORDER — VITAMIN D3 25 MCG PO TABS
5000.0000 [IU] | ORAL_TABLET | ORAL | 0 refills | Status: AC
  Filled 2024-03-31 (×2): qty 60, 28d supply, fill #0

## 2024-03-31 MED ORDER — MEGESTROL ACETATE 40 MG PO TABS
40.0000 mg | ORAL_TABLET | Freq: Every day | ORAL | 0 refills | Status: DC
  Filled 2024-03-31 (×2): qty 30, 30d supply, fill #0

## 2024-03-31 MED ORDER — HYDRALAZINE HCL 50 MG PO TABS
50.0000 mg | ORAL_TABLET | Freq: Three times a day (TID) | ORAL | 0 refills | Status: DC
  Filled 2024-03-31 (×2): qty 90, 30d supply, fill #0

## 2024-03-31 MED ORDER — CYANOCOBALAMIN 1000 MCG PO TABS
500.0000 ug | ORAL_TABLET | Freq: Every day | ORAL | 0 refills | Status: AC
  Filled 2024-03-31 (×2): qty 15, 30d supply, fill #0

## 2024-03-31 NOTE — Discharge Summary (Signed)
 Physician Discharge Summary  Michele Morales FMW:[id number] DOB: [birth year] DOA: 03/18/2024  PCP: Celestia Rosaline SQUIBB, NP  Admit date: 03/18/2024 Discharge date: 03/31/2024  Admitted From: Home Disposition: Home Home  Recommendations for Outpatient Follow-up:  Follow up with PCP in 1-2 weeks Please obtain BMP/CBC in one week your next doctors visit.  Outpatient HD has been arranged by nephrology service Outpatient follow-up with vascular Outpatient follow-up with GYN for pelvic adenomyosis Prescribed labetalol  and hydralazine .  Further adjust as necessary Continue PPI twice daily Bowel regimen prescribed Discontinue lisinopril  and hydrochlorothiazide  Insulin  dosage changed to 13 units.  Continue to adjust as necessary  Discharge Condition: Stable CODE STATUS: Full code Diet recommendation: Renal  Brief/Interim Summary: Brief Narrative:  48 year old with history of insulin -dependent diabetes, HTN, HLD, diabetic neuropathy comes to the ED with referral for PCP due to abnormal lab work.  Creatinine noted to be 2.5, hemoglobin 6.5.  Upon admission she was given unit PRBC transfusion, hemoglobin stabilized around 8.5.  To help with menstruation/menorrhagia she was started on Megace  per OB recommendations.  Pelvic ultrasound showed adenomyosis. Patient has continued to rise in creatinine therefore eventually temporary HD catheter was placed with plans for renal biopsy.  Antihypertensives were adjusted as mentioned below.  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  Neuro was consulted for left arm AV fistula versus graft placement along with tunneled catheter in the OR.  Patient tolerated this procedure well.  Hemoglobin remained stable and patient without any complaints.  Patient has been cleared by vascular and nephrology for discharge with outpatient follow-up.  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  Temporary dialysis catheter removed 8/6 by vascular.  Placed aVF and tunnel catheter on 8/6.  Outpatient follow-up is recommended - Cleared by nephrology for outpatient HD  Acute on chronic anemia, symptomatic: Menorrhagia, related to adenomyosis - Baseline hemoglobin 10 but drifted down to 6.5.   had borderline low ferritin.  This is likely combination of menorrhagia and chronic kidney disease.  Pelvic ultrasound showed adenomyosis.  Previous provider discussed case with GYN who recommended Megace  40 mg daily and outpatient follow-up.  During hospitalization patient has received total 3 units of PRBC and IV iron .  Closely monitor hemoglobin.  No further signs of obvious bleeding.   E. coli urinary tract infection Completed 5 days of Rocephin  7/30   Insulin -dependent diabetes: -Sliding scale and Accu-Cheks and long-acting.  Adjust as appropriate   Constipation:  -Aggressive bowel regimen.    Essential hypertension, uncontrolled Continue hydralazine  50 mg every 8 hours.  Increase labetalol  300 mg twice daily. IV as needed   DVT prophylaxis: SCD Code Status: Full code Family Communication: Disposition Plan:  Status is: Inpatient Discharge once cleared by Vasc & Nephro   Subjective: Doing well no complaints.  Husband present at bedside Examination:  General exam: Appears calm and comfortable  Respiratory system: Clear to auscultation. Respiratory effort normal. Cardiovascular system: S1 & S2 heard, RRR. No JVD, murmurs, rubs, gallops or clicks. No pedal  edema. Gastrointestinal system: Abdomen is nondistended, soft and nontender. No organomegaly or  masses felt. Normal bowel sounds heard. Central nervous system: Alert and oriented. No focal neurological deficits. Extremities: Symmetric 5 x 5 power. Skin: No rashes, lesions or ulcers Psychiatry: Judgement and insight appear normal. Mood & affect appropriate.    Discharge Diagnoses:  Principal Problem:   Symptomatic anemia Active Problems:   Type 2 diabetes mellitus with hyperglycemia, with long-term current use of insulin  [facility])   Essential hypertension   Hyperkalemia   Acute kidney injury superimposed on stage 3b chronic kidney disease (HCC)      Discharge Exam: Vitals:   03/31/24 0820 03/31/24 0930  BP: 127/66 127/66  Pulse: 94 94  Resp: 17   Temp:    SpO2: 95%    Vitals:   03/31/24 0428 03/31/24 0526 03/31/24 0820 03/31/24 0930  BP: (!) 140/66 (!) 179/67 127/66 127/66  Pulse: 99  94 94  Resp: 17  17   Temp: 98.6 F (37 C)     TempSrc: Oral     SpO2: 94%  95%   Weight:      Height:          Discharge Instructions   Allergies as of 03/31/2024       Reactions   Amlodipine  Swelling   BLE edema        Medication List     STOP taking these medications    hydrochlorothiazide  25 MG tablet Commonly known as: HYDRODIURIL    lisinopril  20 MG tablet Commonly known as: ZESTRIL        TAKE these medications    Basaglar  KwikPen 100 UNIT/ML Inject 13 Units into the skin daily. What changed: how much to take   cyanocobalamin  1000 MCG tablet Take 0.5 tablets (500 mcg total) by mouth daily. Start taking on: April 01, 2024   Darbepoetin Alfa  100 MCG/0.5ML Sosy injection Commonly known as: ARANESP  Inject 0.5 mLs (100 mcg total) into the skin every Sunday at 6pm. Start taking on: April 03, 2024   hydrALAZINE  50 MG tablet Commonly known as: APRESOLINE  Take 1 tablet (50 mg total) by mouth every 8 (eight) hours.   hydrOXYzine  25 MG tablet Commonly known  as: ATARAX  Take 1 tablet (25 mg total) by mouth 3 (three) times daily as needed for anxiety.   labetalol  300 MG tablet Commonly known as: NORMODYNE  Take 1 tablet (300 mg total) by mouth 2 (two) times daily.   megestrol  40 MG tablet Commonly known as: MEGACE  Tome 1 tableta (40 mg en total) por va oral diariamente. (Take 1 tablet (40 mg total) by mouth daily.) Start taking on: April 01, 2024   pantoprazole  40 MG tablet Commonly known as: PROTONIX  Take 1 tablet (40 mg total) by mouth 2 (two) times daily before a meal.   polyethylene glycol powder 17 GM/SCOOP powder Commonly known as: GLYCOLAX /MIRALAX  Take 17 g by mouth daily as needed.   rosuvastatin  10 MG tablet Commonly known as: CRESTOR  Tome 1 tableta (10 mg en total) por va oral diariamente. (Take 1 tablet (10 mg total) by mouth daily.)   senna 8.6 MG Tabs tablet Commonly known as: SENOKOT Take 1 tablet (8.6 mg total) by mouth daily. Start taking on: April 01, 2024   True Metrix Blood Glucose Test test strip Generic drug: glucose blood Use to check blood sugar three times daily.   TRUEplus 5-Bevel Pen Needles 32G X 4 MM Misc Generic drug: Insulin  Pen Needle Use como se indica. (use as directed)   TRUEplus Lancets 28G Misc Use to check blood sugar three times daily.  Trulicity  1.5 MG/0.5ML Soaj Generic drug: Dulaglutide  Inject 1.5 mg into the skin once a week.   vitamin D3 25 MCG tablet Commonly known as: CHOLECALCIFEROL  Take 5 tablets (5,000 Units total) by mouth every Monday, Wednesday, and Friday. Start taking on: April 01, 2024        Follow-up Information     Celestia Rosaline SQUIBB, NP. Schedule an appointment as soon as possible for a visit.   Specialty: Internal Medicine Contact information: [address] [place] [address] [phone]         [facility] Follow up in 2 week(s).   Specialty: Obstetrics and Gynecology Contact  information: [address] [place]  [address] [phone]        [facility]. Go on 04/01/2024.   Why: Schedule is Tuesday, Thursday, Saturday with 6:15 am start time.  Please go to clinic on Friday between 9:00 am to 2:30 pm to complete paperwork.  On Saturday, please arrive at 6:00 am for 6:15 am start time. Contact information: [address] [place] [address] [phone]                Allergies  Allergen Reactions   Amlodipine  Swelling    BLE edema    You were cared for by a hospitalist during your hospital stay. If you have any questions about your discharge medications or the care you received while you were in the hospital after you are discharged, you can call the unit and asked to speak with the hospitalist on call if the hospitalist that took care of you is not available. Once you are discharged, your primary care physician will handle any further medical issues. Please note that no refills for any discharge medications will be authorized once you are discharged, as it is imperative that you return to your primary care physician (or establish a relationship with a primary care physician if you do not have one) for your aftercare needs so that they can reassess your need for medications and monitor your lab values.  You were cared for by a hospitalist during your hospital stay. If you have any questions about your discharge medications or the care you received while you were in the hospital after you are discharged, you can call the unit and asked to speak with the hospitalist on call if the hospitalist that took care of you is not available. Once you are discharged, your primary care physician will handle any further medical issues. Please note that NO REFILLS for any discharge medications will be authorized once you are discharged, as it is imperative that you return to your primary care physician (or establish a relationship  with a primary care physician if you do not have one) for your aftercare needs so that they can reassess your need for medications and monitor your lab values.  Please request your Prim.MD to go over all Hospital Tests and Procedure/Radiological results at the follow up, please get all Hospital records sent to your Prim MD by signing hospital release before you go home.  Get CBC, CMP, 2 view Chest X ray checked  by Primary MD during your next visit or SNF MD in 5-7 days ( we routinely change or add medications that can affect your baseline labs and fluid status, therefore we recommend that you get the mentioned basic workup next visit with your PCP, your PCP may decide not to get them or add new tests based on  their clinical decision)  On your next visit with your primary care physician please Get Medicines reviewed and adjusted.  If you experience worsening of your admission symptoms, develop shortness of breath, life threatening emergency, suicidal or homicidal thoughts you must seek medical attention immediately by calling 911 or calling your MD immediately  if symptoms less severe.  You Must read complete instructions/literature along with all the possible adverse reactions/side effects for all the Medicines you take and that have been prescribed to you. Take any new Medicines after you have completely understood and accpet all the possible adverse reactions/side effects.   Do not drive, operate heavy machinery, perform activities at heights, swimming or participation in water activities or provide baby sitting services if your were admitted for syncope or siezures until you have seen by Primary MD or a Neurologist and advised to do so again.  Do not drive when taking Pain medications.   Procedures/Studies: DG Chest Port 1 View Result Date: 03/30/2024 CLINICAL DATA:  Dialysis catheter EXAM: PORTABLE CHEST 1 VIEW COMPARISON:  01/20/2021 FINDINGS: Right-sided central venous catheter tip at the  right atrium. Borderline cardiomegaly with vascular congestion, pulmonary edema and suspected moderate pleural effusions. Airspace disease at the left base. Small nodular focus at the left apex. IMPRESSION: 1. Right-sided central venous catheter tip at the right atrium. No pneumothorax. 2. Borderline cardiomegaly with vascular congestion, pulmonary edema and suspected moderate pleural effusions. Airspace disease at the left base may be due to atelectasis or pneumonia. 3. Small nodular focus at the left apex, possible lung nodule. Chest CT and or two-view chest radiographic follow-up suggested Electronically Signed   By: Luke Bun M.D.   On: 03/30/2024 17:40   VAS US  UPPER EXT VEIN MAPPING (PRE-OP  AVF) Result Date: 03/30/2024 UPPER EXTREMITY VEIN MAPPING Patient Name:  Michele Morales  Date of Exam:   03/29/2024 Medical Rec #: [id number]        Accession #:    [id number] Date of Birth: March 16, 1976        Patient Gender: F Patient Age:   30 years Exam Location:  [facility] Procedure:      VAS US  UPPER EXT VEIN MAPPING (PRE-OP  AVF) Referring Phys: DONNICE SENDER --------------------------------------------------------------------------------  Indications: Pre op. Limitations: IVs Performing Technologist: Elmarie Lindau, RVT  Examination Guidelines: A complete evaluation includes B-mode imaging, spectral Doppler, color Doppler, and power Doppler as needed of all accessible portions of each vessel. Bilateral testing is considered an integral part of a complete examination. Limited examinations for reoccurring indications may be performed as noted. +-----------------+-------------+----------+---------------------+ Right Cephalic   Diameter (cm)Depth (cm)      Findings        +-----------------+-------------+----------+---------------------+ Prox upper arm       0.45        0.61                         +-----------------+-------------+----------+---------------------+ Mid upper arm        0.35         0.68                         +-----------------+-------------+----------+---------------------+ Dist upper arm       0.36        0.46                         +-----------------+-------------+----------+---------------------+ Antecubital fossa    0.49  0.30                         +-----------------+-------------+----------+---------------------+ Prox forearm         0.30        0.39                         +-----------------+-------------+----------+---------------------+ Mid forearm                                   thrombus        +-----------------+-------------+----------+---------------------+ Dist forearm                            IV and not visualized +-----------------+-------------+----------+---------------------+ Wrist                                            IV           +-----------------+-------------+----------+---------------------+ +-----------------+-------------+----------+---------+ Right Basilic    Diameter (cm)Depth (cm)Findings  +-----------------+-------------+----------+---------+ Prox upper arm       0.76        0.78             +-----------------+-------------+----------+---------+ Mid upper arm        0.34        1.15             +-----------------+-------------+----------+---------+ Dist upper arm       0.52        0.94             +-----------------+-------------+----------+---------+ Antecubital fossa    0.24        0.25             +-----------------+-------------+----------+---------+ Prox forearm         0.24        0.18             +-----------------+-------------+----------+---------+ Mid forearm          0.23        0.21   branching +-----------------+-------------+----------+---------+ +-----------------+-------------+----------+--------+ Left Cephalic    Diameter (cm)Depth (cm)Findings +-----------------+-------------+----------+--------+ Prox upper arm       0.35        0.71             +-----------------+-------------+----------+--------+ Mid upper arm        0.31        0.47            +-----------------+-------------+----------+--------+ Dist upper arm       0.12        0.16            +-----------------+-------------+----------+--------+ Antecubital fossa    0.26        0.36            +-----------------+-------------+----------+--------+ Prox forearm         0.25        0.35            +-----------------+-------------+----------+--------+ Mid forearm          0.26        0.59            +-----------------+-------------+----------+--------+ Dist forearm         0.25  0.43            +-----------------+-------------+----------+--------+ +--------------+-------------+----------+--------+ Left Basilic  Diameter (cm)Depth (cm)Findings +--------------+-------------+----------+--------+ Prox upper arm    0.73        1.22            +--------------+-------------+----------+--------+ Mid upper arm     0.25        0.99            +--------------+-------------+----------+--------+ Dist upper arm    0.26        0.61            +--------------+-------------+----------+--------+ Prox forearm      0.25        0.27            +--------------+-------------+----------+--------+ Mid forearm       0.26        0.27            +--------------+-------------+----------+--------+ Distal forearm    0.29        0.24            +--------------+-------------+----------+--------+ Tech error: images [id number] is annotated right, however it is the left Summary: Right: Patent right cephalic from upper arm to prox forearm.        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        The cephalic measures from 0.30 cm to 0.49 cm.        Patent right basilic vein with no evidence of intraluminal        material.        The basilic meaures from 0.23 cm to 0.76 cm. Left: Patent left cephalic and basilic veins with no  evidence of       intraluminal material.       The cephalic measures from 0.12 cm to 0.35 cm.       The basilic meaures from 0.25 cm to 0.73 cm. *See table(s) above for measurements and observations.  Diagnosing physician: Debby Robertson Electronically signed by Debby Robertson on 03/30/2024 at 3:04:35 PM.    Final    HYBRID OR IMAGING ([facility] ONLY) Result Date: 03/30/2024 There is no interpretation for this exam.  This order is for images obtained during a surgical procedure.  Please See Surgeries Tab for more information regarding the procedure.   US  BIOPSY (KIDNEY) Result Date: 03/24/2024 INDICATION: 48 year old female with history of acute kidney failure. EXAM: ULTRASOUND GUIDED RENAL BIOPSY COMPARISON:  None Available. MEDICATIONS: None. ANESTHESIA/SEDATION: Fentanyl  75 mcg IV; Versed  1 mg IV Total Moderate Sedation time: 10 minutes; The patient was continuously monitored during the procedure by the interventional radiology nurse under my direct supervision. COMPLICATIONS: None immediate. PROCEDURE: Informed written consent was obtained from the patient after a discussion of the risks, benefits and alternatives to treatment. The patient understands and consents the procedure. A timeout was performed prior to the initiation of the procedure. Ultrasound scanning was performed of the bilateral flanks. The inferior pole of the right kidney was selected for biopsy due to location and sonographic window. The procedure was planned. The operative site was prepped and draped in the usual sterile fashion. The overlying soft tissues were anesthetized with 1% lidocaine  with epinephrine . A 17 gauge coaxial introducer needle was advanced into the inferior cortex of the right kidney and 2 core biopsies were obtained with an 18 gauge biopsy device under direct ultrasound guidance. Images were saved for documentation purposes. The biopsy device  was removed and Gel-Foam slurry was injected under fluoroscopic guidance while  removing the introducer needle. Hemostasis was obtained with manual compression. Post procedural scanning was negative for significant post procedural hemorrhage or additional complication. A dressing was placed. The patient tolerated the procedure well without immediate post procedural complication. IMPRESSION: Technically successful ultrasound guided right renal biopsy. Ester Sides, MD [facility] Electronically Signed   By: Ester Sides M.D.   On: 03/24/2024 22:51   IR Fluoro Guide CV Line Right Result Date: 03/22/2024 INDICATION: [id number] Acute kidney injury (HCC) [id number] EXAM: ULTRASOUND GUIDANCE FOR VASCULAR ACCESS RIGHT IJ TEMPORARY DIALYSIS TYPE CATHETER MEDICATIONS: 1% lidocaine  local ANESTHESIA/SEDATION: None. FLUOROSCOPY: Radiation Exposure Index (as provided by the fluoroscopic device): 2.0 mGy Kerma COMPLICATIONS: None immediate. PROCEDURE: Informed written consent was obtained from the patient after a thorough discussion of the procedural risks, benefits and alternatives. All questions were addressed. Maximal Sterile Barrier Technique was utilized including caps, mask, sterile gowns, sterile gloves, sterile drape, hand hygiene and skin antiseptic. A timeout was performed prior to the initiation of the procedure. Under sterile conditions and local anesthesia, ultrasound micropuncture access performed the right internal jugular vein. Images obtained for documentation of the patent right internal jugular vein. 018 guidewire advanced to the IVC. Micro dilator set advanced. Over an Amplatz guidewire, a 16 cm Mahurkar type temporary dialysis catheter was advanced. Tip position in the SVC RA junction. Guidewire removed. Blood aspirated easily followed by saline and heparin  flushes. Appropriate volume strength of heparin  instilled in all lumens followed by external caps. Catheter secured with retention sutures and a sterile dressing. No immediate  complication. Patient tolerated the procedure well. IMPRESSION: Successful ultrasound fluoroscopic right IJ temporary dialysis type catheter. Ready for use. Electronically Signed   By: CHRISTELLA.  Shick M.D.   On: 03/22/2024 12:58   IR US  Guide Vasc Access Right Result Date: 03/22/2024 INDICATION: [id number] Acute kidney injury (HCC) 402455 EXAM: ULTRASOUND GUIDANCE FOR VASCULAR ACCESS RIGHT IJ TEMPORARY DIALYSIS TYPE CATHETER MEDICATIONS: 1% lidocaine  local ANESTHESIA/SEDATION: None. FLUOROSCOPY: Radiation Exposure Index (as provided by the fluoroscopic device): 2.0 mGy Kerma COMPLICATIONS: None immediate. PROCEDURE: Informed written consent was obtained from the patient after a thorough discussion of the procedural risks, benefits and alternatives. All questions were addressed. Maximal Sterile Barrier Technique was utilized including caps, mask, sterile gowns, sterile gloves, sterile drape, hand hygiene and skin antiseptic. A timeout was performed prior to the initiation of the procedure. Under sterile conditions and local anesthesia, ultrasound micropuncture access performed the right internal jugular vein. Images obtained for documentation of the patent right internal jugular vein. 018 guidewire advanced to the IVC. Micro dilator set advanced. Over an Amplatz guidewire, a 16 cm Mahurkar type temporary dialysis catheter was advanced. Tip position in the SVC RA junction. Guidewire removed. Blood aspirated easily followed by saline and heparin  flushes. Appropriate volume strength of heparin  instilled in all lumens followed by external caps. Catheter secured with retention sutures and a sterile dressing. No immediate complication. Patient tolerated the procedure well. IMPRESSION: Successful ultrasound fluoroscopic right IJ temporary dialysis type catheter. Ready for use. Electronically Signed   By: CHRISTELLA.  Shick M.D.   On: 03/22/2024 12:58   DG Abd 1 View Result Date: 03/21/2024 CLINICAL DATA:  Nausea and vomiting. EXAM:  ABDOMEN - 1 VIEW COMPARISON:  None Available. FINDINGS: The bowel gas pattern is normal. A large stool burden is seen throughout the colon. No radio-opaque calculi or other significant radiographic abnormality are seen. IMPRESSION: Negative.  Electronically Signed   By: Suzen Dials M.D.   On: 03/21/2024 13:24   US  PELVIS (TRANSABDOMINAL ONLY) Result Date: 03/19/2024 CLINICAL DATA:  Abnormal uterine bleeding. EXAM: TRANSABDOMINAL ULTRASOUND OF PELVIS TECHNIQUE: Transabdominal ultrasound examination of the pelvis was performed including evaluation of the uterus, ovaries, adnexal regions, and pelvic cul-de-sac. COMPARISON:  None Available. FINDINGS: Uterus Measurements: 10.6 x 4.7 x 5.3 cm = volume: 138 mL. The uterus demonstrates a heterogeneous echotexture with findings suspicious for adenomyosis. Endometrium Thickness: 14 mm. No focal abnormality visualized. There is indistinct junctional zone. Right ovary Measurements: 2.8 x 1.6 x 2.5 cm = volume: 6.1 mL. There is a 2 cm cyst in the right ovary. Left ovary Measurements: 4.1 x 2.6 x 3.4 cm = volume: 19 mL. Normal appearance/no adnexal mass. No free fluid in the pelvis. Faint echogenic floating debris in the urinary bladder. Correlation with urinalysis recommended to exclude UTI. IMPRESSION: 1. Heterogeneous uterus with findings suggestive of adenomyosis. 2. Unremarkable ovaries. Electronically Signed   By: Vanetta Chou M.D.   On: 03/19/2024 10:20   US  RENAL Result Date: 03/19/2024 CLINICAL DATA:  Acute renal injury EXAM: RENAL / URINARY TRACT ULTRASOUND COMPLETE COMPARISON:  None Available. FINDINGS: Right Kidney: Renal measurements: 9.8 x 4.6 x 5.2 cm. = volume: 125 mL. Echogenicity within normal limits. No mass or hydronephrosis visualized. Left Kidney: Renal measurements: 10.9 x 5.9 x 5.8 cm. = volume: 194 mL. Minimal fullness of the left collecting system is noted. This persists following voiding although significant decompression of the bladder  was not seen. Bladder: Appears normal for degree of bladder distention. Other: None. IMPRESSION: Minimal fullness of the left collecting system likely related to the distended bladder. Electronically Signed   By: Oneil Devonshire M.D.   On: 03/19/2024 01:44     The results of significant diagnostics from this hospitalization (including imaging, microbiology, ancillary and laboratory) are listed below for reference.     Microbiology: Recent Results (from the past 240 hours)  Surgical PCR screen     Status: None   Collection Time: 03/29/24  7:55 PM   Specimen: Nasal Mucosa; Nasal Swab  Result Value Ref Range Status   MRSA, PCR NEGATIVE NEGATIVE Final   Staphylococcus aureus NEGATIVE NEGATIVE Final    Comment: (NOTE) The Xpert SA Assay (FDA approved for NASAL specimens in patients 66 years of age and older), is one component of a comprehensive surveillance program. It is not intended to diagnose infection nor to guide or monitor treatment. Performed at [facility], [address] [place], [place] [address]      Labs: BNP (last 3 results) No results for input(s): BNP in the last 8760 hours. Basic Metabolic Panel: Recent Labs  Lab 03/25/24 1158 03/26/24 0743 03/27/24 0640 03/28/24 0737 03/29/24 0415 03/30/24 0308 03/30/24 1323 03/31/24 0836  NA 130* 132* 133* 131* 132* 133* 134* 130*  K 4.8 4.6 5.6* 5.1 4.3 4.1 3.7 4.2  CL 99 97* 97* 97* 96* 99 97* 97*  CO2 23 25 26 24 28 26   --  24  GLUCOSE 199* 150* 129* 159* 186* 159* 99 289*  BUN 36* 21* 36* 48* 27* 35* 18 33*  CREATININE 3.95* 3.21* 3.91* 4.13* 2.82* 3.71* 2.20* 3.92*  CALCIUM  7.8* 7.7* 7.8* 8.4* 7.8* 7.9*  --  7.7*  MG  --  1.8 1.9 2.0 1.9 2.2  --   --   PHOS 5.3*  --   --  4.6  --   --   --   --  Liver Function Tests: Recent Labs  Lab 03/25/24 1158 03/28/24 0737  ALBUMIN  2.5* 2.8*   No results for input(s): LIPASE, AMYLASE in the last 168 hours. No results for input(s): AMMONIA in the last 168  hours. CBC: Recent Labs  Lab 03/27/24 0640 03/28/24 0737 03/29/24 0415 03/30/24 0308 03/30/24 1323 03/31/24 0836  WBC 14.5* 14.7* 12.5* 13.8*  --  14.9*  HGB 7.7* 7.9* 7.1* 8.3* 9.2* 8.6*  HCT 23.4* 24.7* 21.9* 25.1* 27.0* 25.9*  MCV 90.0 91.1 91.3 90.6  --  90.6  PLT 230 241 239 242  --  302   Cardiac Enzymes: No results for input(s): CKTOTAL, CKMB, CKMBINDEX, TROPONINI in the last 168 hours. BNP: Invalid input(s): POCBNP CBG: Recent Labs  Lab 03/30/24 1218 03/30/24 1703 03/30/24 1753 03/30/24 2120 03/31/24 0817  GLUCAP 106* 94 108* 296* 278*   D-Dimer No results for input(s): DDIMER in the last 72 hours. Hgb A1c No results for input(s): HGBA1C in the last 72 hours. Lipid Profile No results for input(s): CHOL, HDL, LDLCALC, TRIG, CHOLHDL, LDLDIRECT in the last 72 hours. Thyroid function studies No results for input(s): TSH, T4TOTAL, T3FREE, THYROIDAB in the last 72 hours.  Invalid input(s): FREET3 Anemia work up No results for input(s): VITAMINB12, FOLATE, FERRITIN, TIBC, IRON , RETICCTPCT in the last 72 hours. Urinalysis    Component Value Date/Time   COLORURINE YELLOW 03/19/2024 0859   APPEARANCEUR CLOUDY (A) 03/19/2024 0859   LABSPEC 1.006 03/19/2024 0859   PHURINE 6.0 03/19/2024 0859   GLUCOSEU NEGATIVE 03/19/2024 0859   HGBUR NEGATIVE 03/19/2024 0859   BILIRUBINUR NEGATIVE 03/19/2024 0859   KETONESUR NEGATIVE 03/19/2024 0859   PROTEINUR 100 (A) 03/19/2024 0859   UROBILINOGEN 1.0 09/02/2017 1328   NITRITE NEGATIVE 03/19/2024 0859   LEUKOCYTESUR LARGE (A) 03/19/2024 0859   Sepsis Labs Recent Labs  Lab 03/28/24 0737 03/29/24 0415 03/30/24 0308 03/31/24 0836  WBC 14.7* 12.5* 13.8* 14.9*   Microbiology Recent Results (from the past 240 hours)  Surgical PCR screen     Status: None   Collection Time: 03/29/24  7:55 PM   Specimen: Nasal Mucosa; Nasal Swab  Result Value Ref Range Status   MRSA, PCR  NEGATIVE NEGATIVE Final   Staphylococcus aureus NEGATIVE NEGATIVE Final    Comment: (NOTE) The Xpert SA Assay (FDA approved for NASAL specimens in patients 64 years of age and older), is one component of a comprehensive surveillance program. It is not intended to diagnose infection nor to guide or monitor treatment. Performed at [facility], [address], [place], [place] [address]      Time coordinating discharge:  I have spent 35 minutes face to face with the patient and on the ward discussing the patients care, assessment, plan and disposition with other care givers. >50% of the time was devoted counseling the patient about the risks and benefits of treatment/Discharge disposition and coordinating care.   SIGNED:   Burgess JAYSON Dare, MD  [facility] 03/31/2024, 11:05 AM   If 7PM-7AM, please contact night-coverage

## 2024-03-31 NOTE — Discharge Instructions (Signed)
   [facility]  Discharge Instructions  AV Fistula or Graft Surgery for Dialysis Access  Please refer to the following instructions for your post-procedure care. Your surgeon or physician assistant will discuss any changes with you.  Activity  You may drive the day following your surgery, if you are comfortable and no longer taking prescription pain medication. Resume full activity as the soreness in your incision resolves.  Bathing/Showering  You may shower after you go home. Keep your incision dry for 48 hours. Do not soak in a bathtub, hot tub, or swim until the incision heals completely. You may not shower if you have a hemodialysis catheter.  Incision Care  Clean your incision with mild soap and water after 48 hours. Pat the area dry with a clean towel. You do not need a bandage unless otherwise instructed. Do not apply any ointments or creams to your incision. You may have skin glue on your incision. Do not peel it off. It will come off on its own in about one week. Your arm may swell a bit after surgery. To reduce swelling use pillows to elevate your arm so it is above your heart. Your doctor will tell you if you need to lightly wrap your arm with an ACE bandage.  Diet  Resume your normal diet. There are not special food restrictions following this procedure. In order to heal from your surgery, it is CRITICAL to get adequate nutrition. Your body requires vitamins, minerals, and protein. Vegetables are the best source of vitamins and minerals. Vegetables also provide the perfect balance of protein. Processed food has little nutritional value, so try to avoid this.  Medications  Resume taking all of your medications. If your incision is causing pain, you may take over-the counter pain relievers such as acetaminophen  (Tylenol ). If you were prescribed a stronger pain medication, please be aware these medications can cause nausea and constipation. Prevent  nausea by taking the medication with a snack or meal. Avoid constipation by drinking plenty of fluids and eating foods with high amount of fiber, such as fruits, vegetables, and grains.  Do not take Tylenol  if you are taking prescription pain medications.  Follow up Your surgeon may want to see you in the office following your access surgery. If so, this will be arranged at the time of your surgery.  Please call us  immediately for any of the following conditions:  Increased pain, redness, drainage (pus) from your incision site Fever of 101 degrees or higher Severe or worsening pain at your incision site Hand pain or numbness.  Reduce your risk of vascular disease:  Stop smoking. If you would like help, call [facility] at 1-800-QUIT-NOW ((628)092-7678) or [place] at 364-586-7154  Manage your cholesterol Maintain a desired weight Control your diabetes Keep your blood pressure down  Dialysis  It will take several weeks to several months for your new dialysis access to be ready for use. Your surgeon will determine when it is okay to use it. Your nephrologist will continue to direct your dialysis. You can continue to use your Permcath until your new access is ready for use.   03/31/2024 Michele Morales [id number] Jan 28, 1976  Surgeon(s): Serene Gaile ORN, MD  Procedure(s): BRACHIOBASILIC ARTERIOVENOUS (AV) FISTULA CREATION INSERTION OF DIALYSIS CATHETER USING 23CM PALINDROME DUAL LUMEN CATHETER  x Do not stick fistula for 12 weeks    If you have any questions, please call the office at [phone].

## 2024-03-31 NOTE — Progress Notes (Signed)
 Admit: 03/18/2024 LOS: 13  86F new ESRD from [facility]  Subjective:  S/p LUE BBF and TDC placement yesterday HD yesterday: 1.4L UF CLIP to GOC THS completed On RA, BPs stable, afebrile  08/06 0701 - 08/07 0700 In: 1090 [P.O.:240; I.V.:600; IV Piggyback:250] Out: 2075 [Urine:650; Blood:25]  Filed Weights   03/30/24 0824 03/30/24 1139 03/30/24 1240  Weight: 66.2 kg 64.6 kg 64.6 kg    Scheduled Meds:  Chlorhexidine  Gluconate Cloth  6 each Topical Q0600   cholecalciferol   5,000 Units Oral Q M,W,F   darbepoetin (ARANESP ) injection - DIALYSIS  100 mcg Subcutaneous Q Sun-1800   feeding supplement (NEPRO CARB STEADY)  237 mL Oral BID BM   hydrALAZINE   50 mg Oral Q8H   insulin  aspart  0-9 Units Subcutaneous TID WC   insulin  glargine-yfgn  13 Units Subcutaneous Daily   labetalol   300 mg Oral BID   megestrol   40 mg Oral Daily   pantoprazole   40 mg Oral BID AC   polyethylene glycol  17 g Oral BID   rosuvastatin   10 mg Oral Daily   senna  1 tablet Oral Daily   sodium chloride  flush  10-40 mL Intracatheter Q12H   vitamin B-12  500 mcg Oral Daily   Continuous Infusions:  iron  sucrose 200 mg (03/29/24 1602)   PRN Meds:.acetaminophen , guaiFENesin , hydrALAZINE , HYDROmorphone  (DILAUDID ) injection, hydrOXYzine , ipratropium-albuterol , labetalol , ondansetron  (ZOFRAN ) IV, ondansetron  **OR** [DISCONTINUED] ondansetron  (ZOFRAN ) IV, oxyCODONE , prochlorperazine , senna-docusate, traZODone   Current Labs: reviewed   Physical Exam:  Blood pressure 127/66, pulse 94, temperature 98.6 F (37 C), temperature source Oral, resp. rate 17, height 4' 11 (1.499 m), weight 64.6 kg, last menstrual period 03/04/2024, SpO2 95%. 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 Morphine should not be used.  Baclofen should be avoided Avoid oral sodium phosphate  and magnesium citrate based laxatives / bowel preps    Bernardino Gasman MD 03/31/2024, 9:19 AM  Recent Labs  Lab 03/25/24 1158 03/26/24 0743 03/28/24 0737 03/29/24 0415 03/30/24 0308 03/30/24 1323  NA 130*   < > 131* 132* 133* 134*  K 4.8   < > 5.1 4.3 4.1 3.7  CL 99   < > 97* 96* 99 97*  CO2 23   < > 24 28 26   --   GLUCOSE 199*   < > 159* 186* 159* 99  BUN 36*   < > 48* 27* 35* 18  CREATININE 3.95*   < > 4.13* 2.82* 3.71* 2.20*  CALCIUM  7.8*   < > 8.4* 7.8* 7.9*  --   PHOS 5.3*  --  4.6  --   --   --    < > = values in this interval not displayed.   Recent Labs  Lab 03/28/24 0737 03/29/24 0415 03/30/24 0308 03/30/24 1323  WBC 14.7* 12.5* 13.8*  --   HGB 7.9* 7.1* 8.3* 9.2*  HCT 24.7* 21.9* 25.1* 27.0*  MCV 91.1 91.3 90.6  --   PLT 241 239 242  --

## 2024-03-31 NOTE — Anesthesia Postprocedure Evaluation (Signed)
 Anesthesia Post Note  Patient: Michele Morales  Procedure(s) Performed: BRACHIOBASILIC ARTERIOVENOUS (AV) FISTULA CREATION (Left: Arm Upper) INSERTION OF DIALYSIS CATHETER USING 23CM PALINDROME DUAL LUMEN CATHETER (Right: Chest)     Patient location during evaluation: PACU Anesthesia Type: General Level of consciousness: awake and alert Pain management: pain level controlled Vital Signs Assessment: post-procedure vital signs reviewed and stable Respiratory status: spontaneous breathing, nonlabored ventilation, respiratory function stable and patient connected to nasal cannula oxygen Cardiovascular status: blood pressure returned to baseline and stable Postop Assessment: no apparent nausea or vomiting Anesthetic complications: no   There were no known notable events for this encounter.  Last Vitals:  Vitals:   03/31/24 0820 03/31/24 0930  BP: 127/66 127/66  Pulse: 94 94  Resp: 17   Temp:    SpO2: 95%     Last Pain:  Vitals:   03/31/24 0859  TempSrc:   PainSc: 4                  [place] MARLA Cornea

## 2024-03-31 NOTE — Progress Notes (Addendum)
 Se habl con el paciente/cuidador sobre las instrucciones para el alta (incluyendo medicamentos) y se le proporcion una copia  Patient husband in the room and spoke english and daughter speaks english.  Called and went over the patients discharge instructions with him and with her because patient did not want to wait for interpretor.

## 2024-03-31 NOTE — Progress Notes (Addendum)
 Met with pt and pt's husband at bedside with on-site Spanish interpreter this am. Discussed pt's acceptance at [facility] TTS 6:15 am chair time. Also advised pt and pt's spouse that pt can start at clinic on Saturday if pt completes paperwork at clinic tomorrow. Pt and pt's spouse agreeable to HD arrangements and completing paperwork tomorrow. Schedule letter provided to pt's spouse and HD info added to AVS as well. Update provided to attending, nephrologist, pt's RN,and case manager. Contacted renal NP to request that orders be faxed to clinic. Will assist as needed.   Randine Mungo Dialysis Navigator [phone]  Addendum at 10:17 am: Clinic advised pt to d/c today and should come to clinic tomorrow for paperwork.

## 2024-03-31 NOTE — Progress Notes (Signed)
 New Dialysis Start    Patient identified as new dialysis start. Kidney Education packet assembled and given. Discussed the following items with patient with on site interpreter Raquel:     Current medications and possible changes once started:  Discussed that patient's medications may change over time.  Ex; hypertension medications and diabetes medication.  Nephrologists will adjust as needed.   Fluid restrictions reviewed:  32 oz daily goal:  All liquids count; soups, ice, jello, fruits. Will also refer dietitian.   Phosphorus and potassium: Handout given showing high potassium and phosphorus foods.  Alternative food and drink options given. Will also refer dietitian.   Family support:  Husband at bedside   Outpatient Clinic Resources:  Discussed roles of Outpatient clinic staff and advised to make a list of needs, if any, to talk with outpatient staff if needed.   Care plan schedule: Informed patient of Care Plans in outpatient setting and to participate in the care plan.  An invitation would be given from outpatient clinic.    Dialysis Access Options:  Reviewed access options with patients. Discussed in detail about care at home with new AVG & AVF. Reviewed checking bruit and thrill. If dialysis catheter present, educated that patient could not take showers.  Catheter dressing changes were to be done by outpatient clinic staff only.    Patient verbalized understanding. Will continue to round on patient during admission.    Alfonso Sar Dialysis Nurse Coordinator [phone]

## 2024-03-31 NOTE — Discharge Planning (Signed)
 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 Underwent  first HD 7/29, 2nd HD 8/1, 3rd HD 8/4, and 4th HD 8/6 without complications  DM2: longstanding history, poorly controlled, A1c 7.1 (03/19/24). Highest A1c 15 on 06/17/23   HD ORDERS for NEW Start EDW: 64.5 kg - continue to reassess EDW and continue to challenge patient's EDW Qb: 400 DFR: 600 Dialyzer: 160 Time: 4 hours Access: TDC - can dwell with 2600 u of heparin  post tx  Bath: 2K/2.5Ca Bicarb: 39 Temp: 37 ESA: Mircera 50 mcg due at 1st HD Heparin : None given in hospital. Please reassess needs for heparin  during HD.    ANEMIA MANAGEMENT: Aranesp : Given: no    ESA dose for discharge: mircera 50 mcg IV q 2 weeks, to start on 04/01/24 IV Iron  dose at discharge: per protocol Transfusion:  not given  BONE/MINERAL MEDICATIONS: Hectorol/Calcitriol change: per protocol Sensipar/Parsabiv change: per protocol  ACCESS INTERVENTION/CHANGE: no Details: Using [facility] - New LUE access placed 03/30/24 by Dr. Army   RECENT LABS: Recent Labs  Lab 03/28/24 0737 03/29/24 0415 03/31/24 0836  HGB 7.9*   < > 8.6*  NA 131*   < > 130*  K 5.1   < > 4.2  CALCIUM  8.4*   < > 7.7*  PHOS 4.6  --   --   ALBUMIN  2.8*  --   --    < > = values in this interval not displayed.    IV ANTIBIOTICS: no Details:    OTHER/APPTS/LAB ORDERS: please collect updated labs at 1st HD treatment.    D/C Meds to be reconciled by nurse after every discharge.  Completed By:   Reviewed by: MD:______ RN_______

## 2024-03-31 NOTE — Progress Notes (Signed)
  Postoperative hemodialysis access     Date of Surgery:  03/30/2024 Surgeon: Serene Bloomer interpreter Tawni used this morning.  Subjective:  having some pain around incision.  Denies hand pain  PHYSICAL EXAMINATION:  Vitals:   03/31/24 0428 03/31/24 0526  BP: (!) 140/66 (!) 179/67  Pulse: 99   Resp: 17   Temp: 98.6 F (37 C)   SpO2: 94%     Incision is clean and dry with some ecchymosis Sensation in digits is intact;  There is  Thrill  The fistula is palpable  The left radial pulse is palpable   ASSESSMENT/PLAN:  Michele Morales is a 48 y.o. year old female who is s/p [facility] placement and left 1st stage BVT on 03/30/2024 by Dr. Serene.  - fistula is patent -pt does not have evidence of steal sx -f/u with [facility] in 6 weeks to check maturation of AVF.  Discussed with pt that if matures, will need 2nd operation to superficialize it. Discussed fistula may need further interventions and she expressed good understanding.  -will sign off-call as needed.   Lucie Apt, PA-C [facility] [phone]

## 2024-04-01 ENCOUNTER — Other Ambulatory Visit: Payer: Self-pay

## 2024-04-01 ENCOUNTER — Telehealth: Payer: Self-pay | Admitting: *Deleted

## 2024-04-01 DIAGNOSIS — N2581 Secondary hyperparathyroidism of renal origin: Secondary | ICD-10-CM | POA: Insufficient documentation

## 2024-04-01 DIAGNOSIS — R0602 Shortness of breath: Secondary | ICD-10-CM | POA: Insufficient documentation

## 2024-04-01 DIAGNOSIS — L299 Pruritus, unspecified: Secondary | ICD-10-CM | POA: Insufficient documentation

## 2024-04-01 NOTE — Transitions of Care (Post Inpatient/ED Visit) (Signed)
 04/01/2024  Name: Michele Morales MRN: [id number] DOB: 1976-07-24  Today's TOC FU Call Status: Today's TOC FU Call Status:: Successful TOC FU Call Completed TOC FU Call Complete Date: 04/01/24 Patient's Name and Date of Birth confirmed.  Transition Care Management Follow-up Telephone Call Date of Discharge: 03/31/24 Discharge Facility: [facility]) Type of Discharge: Inpatient Admission Primary Inpatient Discharge Diagnosis:: Symptomatic anemia How have you been since you were released from the hospital?: Better Any questions or concerns?: No  Items Reviewed: Did you receive and understand the discharge instructions provided?: Yes Medications obtained,verified, and reconciled?: Yes (Medications Reviewed) Any new allergies since your discharge?: No Dietary orders reviewed?: Yes Type of Diet Ordered:: Renal/Carb modified Do you have support at home?: Yes People in Home [RPT]: spouse Name of Support/Comfort Primary Source: Spouse/Juan  Medications Reviewed Today: Medications Reviewed Today     Reviewed by Lucky Andrea LABOR, RN (Registered Nurse) on 04/01/24 at 1002  Med List Status: <None>   Medication Order Taking? Sig Documenting Provider Last Dose Status Informant  cyanocobalamin  1000 MCG tablet [id number]  Take 0.5 tablets (500 mcg total) by mouth daily.  Patient not taking: Reported on 04/01/2024   Caleen Burgess BROCKS, MD  Active   Darbepoetin Alfa  (ARANESP ) 100 MCG/0.5ML SOSY injection [id number]  Inject 0.5 mLs (100 mcg total) into the skin every Sunday at 6pm.  Patient not taking: Reported on 04/01/2024   Caleen Burgess BROCKS, MD  Active   Dulaglutide  (TRULICITY ) 1.5 MG/0.5ML EMMANUEL [id number] Yes Inject 1.5 mg into the skin once a week. Celestia Rosaline SQUIBB, NP  Active Self, Pharmacy Records           Med Note [facility], ERIN T   Sat Mar 19, 2024  8:09 AM) Pt receives this injection on Mondays   glucose blood (TRUE METRIX BLOOD GLUCOSE TEST) test strip [id number] Yes Use to check blood sugar  three times daily. Newlin, Enobong, MD  Active Self, Pharmacy Records  hydrALAZINE  (APRESOLINE ) 50 MG tablet [id number]  Take 1 tablet (50 mg total) by mouth every 8 (eight) hours.  Patient not taking: Reported on 04/01/2024   Caleen Burgess BROCKS, MD  Active   hydrOXYzine  (ATARAX ) 25 MG tablet [id number]  Take 1 tablet (25 mg total) by mouth 3 (three) times daily as needed for anxiety.  Patient not taking: Reported on 04/01/2024   Caleen Burgess BROCKS, MD  Active   Insulin  Glargine (BASAGLAR  [facility]) 100 UNIT/ML [id number] Yes Inject 13 Units into the skin daily. Caleen Burgess BROCKS, MD  Active   Insulin  Pen Needle (PENTIPS) 32G X 4 MM MISC [id number]  use as directed Mayers, Cari S, PA-C  Active Self, Pharmacy Records  labetalol  (NORMODYNE ) 300 MG tablet [id number]  Take 1 tablet (300 mg total) by mouth 2 (two) times daily.  Patient not taking: Reported on 04/01/2024   Caleen Burgess BROCKS, MD  Active   megestrol  (MEGACE ) 40 MG tablet [id number]  Take 1 tablet (40 mg total) by mouth daily.  Patient not taking: Reported on 04/01/2024   Caleen Burgess BROCKS, MD  Active   pantoprazole  (PROTONIX ) 40 MG tablet [id number]  Take 1 tablet (40 mg total) by mouth 2 (two) times daily before a meal.  Patient not taking: Reported on 04/01/2024   Caleen Burgess BROCKS, MD  Active   polyethylene glycol powder (GLYCOLAX /MIRALAX ) 17 GM/SCOOP powder [id number]  Take 17 g by mouth daily as needed.  Patient not taking: Reported on 04/01/2024   Caleen Burgess BROCKS, MD  Active  rosuvastatin  (CRESTOR ) 10 MG tablet [id number] Yes Take 1 tablet (10 mg total) by mouth daily. Mayers, Kirk RAMAN, PA-C  Active Self, Pharmacy Records           Med Note (MARROW, ERIN T   Sat Mar 19, 2024  8:12 AM) Unable to confirm recent fill history; however, pt reports still taking  senna (SENOKOT) 8.6 MG TABS tablet [id number]  Take 1 tablet (8.6 mg total) by mouth daily.  Patient not taking: Reported on 04/01/2024   Caleen Burgess BROCKS, MD  Active   TRUEplus Lancets 28G MISC [id number] Yes Use to check blood  sugar three times daily. Newlin, Enobong, MD  Active Self, Pharmacy Records  vitamin D3 (CHOLECALCIFEROL ) 25 MCG tablet [id number]  Take 5 tablets (5,000 Units total) by mouth every Monday, Wednesday, and Friday.  Patient not taking: Reported on 04/01/2024   Caleen Burgess BROCKS, MD  Active             Home Care and Equipment/Supplies: Were Home Health Services Ordered?: No Any new equipment or medical supplies ordered?: No  Functional Questionnaire: Do you need assistance with bathing/showering or dressing?: No Do you need assistance with meal preparation?: No Do you need assistance with eating?: No Do you have difficulty maintaining continence: No Do you need assistance with getting out of bed/getting out of a chair/moving?: No Do you have difficulty managing or taking your medications?: No  Follow up appointments reviewed: PCP Follow-up appointment confirmed?: No (Collaborate with Care Guide for scheduling) MD Provider Line [phone] Given: No Specialist Hospital Follow-up appointment confirmed?: Yes Date of Specialist follow-up appointment?: 04/01/24 Follow-Up Specialty Provider:: [facility]-patient will go today to complete paperwork and start dialysis on Saturday Do you need transportation to your follow-up appointment?: No Do you understand care options if your condition(s) worsen?: Yes-patient verbalized understanding  SDOH Interventions Today    Flowsheet Row Most Recent Value  SDOH Interventions   Food Insecurity Interventions Intervention Not Indicated  Housing Interventions Intervention Not Indicated  Transportation Interventions Intervention Not Indicated  Utilities Interventions Intervention Not Indicated    Goals Addressed             This Visit's Progress    VBCI Transitions of Care (TOC) Care Plan       Problems:  Recent Hospitalization for treatment of Symptomatic Anemia, newly diagnosed ESRD Knowledge Deficit Related to language  barrier and No Hospital Follow Up Provider appointment    Goal:  Over the next 30 days, the patient will not experience hospital readmission  Interventions:  Transitions of Care: Doctor Visits  - discussed the importance of doctor visits Communication with Care Guide re: scheduling PCP hospital follow up appointment-scheduled for first available on 04/20/24 at 1:50pm Communication with [facility] and assisted with scheduling for first available with GYN on 05/20/24 Utilized Spanish Interpreter via [employer] Medication review-discussed insulin  dose change, new medications, and instructed patient to pick up new prescriptions from [facility] today, ensured she had address to pharmacy Discussed the importance of compliance with Dialysis, advised patient to go to [facility] to complete paperwork and begin dialysis on Saturday Provided education on HTN, patient will purchase a BP monitor  Patient Self Care Activities:  Attend all scheduled provider appointments Call provider office for new concerns or questions  Notify RN Care Manager of [facility] call rescheduling needs Participate in Transition of Care Program/Attend TOC scheduled calls Take medications as prescribed    Plan:  Telephone  follow up appointment with care management team member scheduled for:  04/11/24 at 2pm        Andrea Dimes RN, BSN [place]  [facility] Institute [facility] Health RN Care Manager [phone]

## 2024-04-08 ENCOUNTER — Encounter (HOSPITAL_COMMUNITY): Payer: Self-pay

## 2024-04-11 ENCOUNTER — Other Ambulatory Visit: Payer: Self-pay | Admitting: *Deleted

## 2024-04-11 NOTE — Transitions of Care (Post Inpatient/ED Visit) (Signed)
 Transition of Care week 2  Visit Note  04/11/2024  Name: Michele Morales MRN: [id number]          DOB: 1976/06/02  Situation: Patient enrolled in [facility] 30-day program. Visit completed with Ms. Strutz by telephone.   Background:   Initial Transition Care Management Follow-up Telephone Call    Past Medical History:  Diagnosis Date   Diabetes mellitus without complication (HCC)    IDDM   Hypercholesteremia    Hypertension    Neuromuscular disorder (HCC)    diabetic neuropathy feet    Assessment: Patient Reported Symptoms: Cognitive Cognitive Status: Able to follow simple commands, Alert and oriented to person, place, and time, Normal speech and language skills (speaks spanish, interpreter utilized)      Neurological Neurological Review of Symptoms: Vision changes Neurological Self-Management Outcome: 3 (uncertain) Neurological Comment: Blurred vision at times. Patient feels vision changes are related to elevated BS.  HEENT HEENT Symptoms Reported: No symptoms reported      Cardiovascular Cardiovascular Symptoms Reported: No symptoms reported    Respiratory Respiratory Symptoms Reported: No symptoms reported    Endocrine Endocrine Symptoms Reported: Blurry vision Is patient diabetic?: Yes Is patient checking blood sugars at home?: Yes List most recent blood sugar readings, include date and time of day: Fasting this am was 244 Endocrine Self-Management Outcome: 4 (good) Endocrine Comment: Patient feels she had better BS management when she took 20 units of insulin  versus taking 13 units as directed  Gastrointestinal Gastrointestinal Symptoms Reported: No symptoms reported      Genitourinary Genitourinary Symptoms Reported: No symptoms reported Genitourinary Management Strategies: Hemodialysis Hemodialysis Schedule: TTS Hemodialysis Last Treatment: 04/09/24 Genitourinary Self-Management Outcome: 4 (good)  Integumentary Integumentary Symptoms Reported: Not assessed     Musculoskeletal Musculoskelatal Symptoms Reviewed: No symptoms reported        Psychosocial Psychosocial Symptoms Reported: Not assessed         Vitals:   04/11/24 1442  BP: (!) 147/92    Medications Reviewed Today     Reviewed by Lucky Andrea LABOR, RN (Registered Nurse) on 04/11/24 at 1420  Med List Status: <None>   Medication Order Taking? Sig Documenting Provider Last Dose Status Informant  cyanocobalamin  1000 MCG tablet [id number] Yes Take 0.5 tablets (500 mcg total) by mouth daily. Caleen Burgess BROCKS, MD  Active   Darbepoetin Alfa  (ARANESP ) 100 MCG/0.5ML SOSY injection [id number] Yes Inject 0.5 mLs (100 mcg total) into the skin every Sunday at 6pm. Amin, Ankit C, MD  Active   Dulaglutide  (TRULICITY ) 1.5 MG/0.5ML EMMANUEL [id number] Yes Inject 1.5 mg into the skin once a week. Celestia Rosaline SQUIBB, NP  Active Self, Pharmacy Records           Med Note [facility], ERIN T   Sat Mar 19, 2024  8:09 AM) Pt receives this injection on Mondays   glucose blood (TRUE METRIX BLOOD GLUCOSE TEST) test strip [id number] Yes Use to check blood sugar three times daily. Newlin, Enobong, MD  Active Self, Pharmacy Records  hydrALAZINE  (APRESOLINE ) 50 MG tablet [id number] Yes Take 1 tablet (50 mg total) by mouth every 8 (eight) hours. Caleen Burgess BROCKS, MD  Active   hydrOXYzine  (ATARAX ) 25 MG tablet [id number] Yes Take 1 tablet (25 mg total) by mouth 3 (three) times daily as needed for anxiety. Caleen Burgess BROCKS, MD  Active   Insulin  Glargine (BASAGLAR  KWIKPEN) 100 UNIT/ML [id number] Yes Inject 13 Units into the skin daily. Caleen Burgess BROCKS, MD  Active   Insulin  Pen Needle (PENTIPS) 32G  X 4 MM MISC [id number] Yes use as directed Mayers, Cari S, PA-C  Active Self, Pharmacy Records  labetalol  (NORMODYNE ) 300 MG tablet [id number] Yes Take 1 tablet (300 mg total) by mouth 2 (two) times daily. Caleen Burgess BROCKS, MD  Active   megestrol  (MEGACE ) 40 MG tablet [id number] Yes Take 1 tablet (40 mg total) by mouth daily. Caleen Burgess BROCKS, MD  Active    pantoprazole  (PROTONIX ) 40 MG tablet [id number] Yes Take 1 tablet (40 mg total) by mouth 2 (two) times daily before a meal. Amin, Ankit C, MD  Active   polyethylene glycol powder (GLYCOLAX /MIRALAX ) 17 GM/SCOOP powder [id number] Yes Take 17 g by mouth daily as needed. Caleen Burgess BROCKS, MD  Active   rosuvastatin  (CRESTOR ) 10 MG tablet [id number] Yes Take 1 tablet (10 mg total) by mouth daily. Mayers, Kirk RAMAN, PA-C  Active Self, Pharmacy Records           Med Note (MARROW, ERIN T   Sat Mar 19, 2024  8:12 AM) Unable to confirm recent fill history; however, pt reports still taking  senna (SENOKOT) 8.6 MG TABS tablet [id number] Yes Take 1 tablet (8.6 mg total) by mouth daily. Caleen Burgess BROCKS, MD  Active   TRUEplus Lancets 28G MISC [id number] Yes Use to check blood sugar three times daily. Newlin, Enobong, MD  Active Self, Pharmacy Records  vitamin D3 (CHOLECALCIFEROL ) 25 MCG tablet [id number] Yes Take 5 tablets (5,000 Units total) by mouth every Monday, Wednesday, and Friday. Caleen Burgess BROCKS, MD  Active             Recommendation:   Continue Current Plan of Care  Follow Up Plan:   Telephone follow-up in 1 week  Andrea Dimes RN, BSN [place]  Value[facility] Care Institute [facility] Health RN Care Manager [phone]

## 2024-04-11 NOTE — Patient Instructions (Signed)
 Visit Information  Thank you for taking time to visit with me today. Please don't hesitate to contact me if I can be of assistance to you before our next scheduled telephone appointment.   Following is a copy of your care plan:   Goals Addressed             This Visit's Progress    VBCI Transitions of Care (TOC) Care Plan       Problems:  Recent Hospitalization for treatment of Symptomatic Anemia, newly diagnosed ESRD Knowledge Deficit Related to language barrier and No Hospital Follow Up Provider appointment -scheduled on 04/20/24  Goal:  Over the next 30 days, the patient will not experience hospital readmission  Interventions:  Transitions of Care: Doctor Visits  - discussed the importance of doctor visits Utilized Spanish Interpreter via [employer] Medication review-discussed insulin  dose change,  Discussed patient is attending dialysis on TTS Provided education on HTN, patient checking BP daily, advised to write down readings and take to PCP visit on 04/20/24 Discussed BS readings, patient checking fasting, advised to record and take to PCP visit on 04/20/24 Provided verbal education on diet-heart healthy, carb modified  Patient Self Care Activities:  Attend all scheduled provider appointments Call provider office for new concerns or questions  Notify RN Care Manager of [facility] call rescheduling needs Participate in Transition of Care Program/Attend TOC scheduled calls Take medications as prescribed    Plan:  Telephone follow up appointment with care management team member scheduled for:  04/20/24 at 3:30pm        Patient verbalizes understanding of instructions and care plan provided today and agrees to view in MyChart. Active MyChart status and patient understanding of how to access instructions and care plan via MyChart confirmed with patient.     Telephone follow up appointment with care management team member scheduled for:04/20/24 at 3:30pm  Please call  the care guide team at [phone] if you need to cancel or reschedule your appointment.   Please call [phone]-TALK (toll free, 24 hour hotline) go to [facility] [address] [place] [phone]) call 911 if you are experiencing a Mental Health or Behavioral Health Crisis or need someone to talk to.  Andrea Dimes RN, BSN [place]  Value[facility]Based Care Institute [facility] RN Care Manager [phone]

## 2024-04-19 ENCOUNTER — Telehealth (INDEPENDENT_AMBULATORY_CARE_PROVIDER_SITE_OTHER): Payer: Self-pay | Admitting: Primary Care

## 2024-04-19 NOTE — Telephone Encounter (Signed)
 Called pt to confirm appt. Pt did not answer. LVM

## 2024-04-20 ENCOUNTER — Other Ambulatory Visit: Payer: Self-pay

## 2024-04-20 ENCOUNTER — Encounter (INDEPENDENT_AMBULATORY_CARE_PROVIDER_SITE_OTHER): Payer: Self-pay | Admitting: Primary Care

## 2024-04-20 ENCOUNTER — Ambulatory Visit (INDEPENDENT_AMBULATORY_CARE_PROVIDER_SITE_OTHER): Payer: Self-pay | Admitting: Primary Care

## 2024-04-20 ENCOUNTER — Other Ambulatory Visit: Payer: Self-pay | Admitting: *Deleted

## 2024-04-20 VITALS — BP 151/72 | HR 87 | Temp 97.9°F | Resp 14 | Ht 62.0 in | Wt 131.6 lb

## 2024-04-20 DIAGNOSIS — Z758 Other problems related to medical facilities and other health care: Secondary | ICD-10-CM

## 2024-04-20 DIAGNOSIS — I10 Essential (primary) hypertension: Secondary | ICD-10-CM

## 2024-04-20 DIAGNOSIS — E1165 Type 2 diabetes mellitus with hyperglycemia: Secondary | ICD-10-CM

## 2024-04-20 DIAGNOSIS — Z603 Acculturation difficulty: Secondary | ICD-10-CM

## 2024-04-20 DIAGNOSIS — I129 Hypertensive chronic kidney disease with stage 1 through stage 4 chronic kidney disease, or unspecified chronic kidney disease: Secondary | ICD-10-CM

## 2024-04-20 DIAGNOSIS — D649 Anemia, unspecified: Secondary | ICD-10-CM

## 2024-04-20 DIAGNOSIS — Z09 Encounter for follow-up examination after completed treatment for conditions other than malignant neoplasm: Secondary | ICD-10-CM

## 2024-04-20 DIAGNOSIS — N179 Acute kidney failure, unspecified: Secondary | ICD-10-CM

## 2024-04-20 DIAGNOSIS — Z794 Long term (current) use of insulin: Secondary | ICD-10-CM

## 2024-04-20 DIAGNOSIS — N1832 Chronic kidney disease, stage 3b: Secondary | ICD-10-CM

## 2024-04-20 DIAGNOSIS — F411 Generalized anxiety disorder: Secondary | ICD-10-CM

## 2024-04-20 MED ORDER — HYDROXYZINE HCL 25 MG PO TABS
25.0000 mg | ORAL_TABLET | Freq: Three times a day (TID) | ORAL | 1 refills | Status: AC | PRN
  Filled 2024-04-20: qty 90, 30d supply, fill #0

## 2024-04-20 NOTE — Progress Notes (Signed)
 Subjective:   Michele Morales is a 48 y.o. Hispanic female presents for hospital follow up .  Patient has given her husband  long [place] permission to be at her appointment and provide information if needed and interpretation.  Admit date to the hospital was 03/18/24, patient was discharged from the hospital on 03/31/24, patient was admitted for:  Symptomatic anemia, Type 2 diabetes mellitus with hyperglycemia, with long-term current use of insulin     Essential hypertension, Hyperkalemia and Acute kidney injury superimposed on stage 3b chronic kidney disease.  Patient was seen by nephrology on 04/01/2024 for end-stage renal disease secondary to advanced diabetic neuropathy.  On 03/30/2024 she had on her left on BRACHIOBASILIC ARTERIOVENOUS (AV) FISTULA and insertion dialysis catheter using dual-lumen cath on her right arm.  She had dialysis inpatient. Reschedule GYN 05/20/24 GYN for pelvic adenomyosis . She is feeling a little better other than staying cold. Menstrual cycle remains but litter taking Megace . Past Medical History:  Diagnosis Date   Diabetes mellitus without complication (HCC)    IDDM   Hypercholesteremia    Hypertension    Neuromuscular disorder (HCC)    diabetic neuropathy feet     Allergies  Allergen Reactions   Amlodipine  Swelling    BLE edema    Current Outpatient Medications on File Prior to Visit  Medication Sig Dispense Refill   cyanocobalamin  1000 MCG tablet Take 0.5 tablets (500 mcg total) by mouth daily. 15 tablet 0   Darbepoetin Alfa  (ARANESP ) 100 MCG/0.5ML SOSY injection Inject 0.5 mLs (100 mcg total) into the skin every Sunday at 6pm.     Dulaglutide  (TRULICITY ) 1.5 MG/0.5ML SOAJ Inject 1.5 mg into the skin once a week. 6 mL 1   glucose blood (TRUE METRIX BLOOD GLUCOSE TEST) test strip Use to check blood sugar three times daily. 100 each 2   hydrALAZINE  (APRESOLINE ) 50 MG tablet Take 1 tablet (50 mg total) by mouth every 8 (eight) hours. 90 tablet 0   hydrOXYzine   (ATARAX ) 25 MG tablet Take 1 tablet (25 mg total) by mouth 3 (three) times daily as needed for anxiety. 30 tablet 0   Insulin  Glargine (BASAGLAR  KWIKPEN) 100 UNIT/ML Inject 13 Units into the skin daily. 3 mL 0   Insulin  Pen Needle (PENTIPS) 32G X 4 MM MISC use as directed 200 each 3   labetalol  (NORMODYNE ) 300 MG tablet Take 1 tablet (300 mg total) by mouth 2 (two) times daily. 60 tablet 0   megestrol  (MEGACE ) 40 MG tablet Take 1 tablet (40 mg total) by mouth daily. 30 tablet 0   pantoprazole  (PROTONIX ) 40 MG tablet Take 1 tablet (40 mg total) by mouth 2 (two) times daily before a meal. 60 tablet 0   polyethylene glycol powder (GLYCOLAX /MIRALAX ) 17 GM/SCOOP powder Take 17 g by mouth daily as needed. 238 g 0   rosuvastatin  (CRESTOR ) 10 MG tablet Take 1 tablet (10 mg total) by mouth daily. 90 tablet 2   senna (SENOKOT) 8.6 MG TABS tablet Take 1 tablet (8.6 mg total) by mouth daily. 120 tablet 0   TRUEplus Lancets 28G MISC Use to check blood sugar three times daily. 100 each 2   vitamin D3 (CHOLECALCIFEROL ) 25 MCG tablet Take 5 tablets (5,000 Units total) by mouth every Monday, Wednesday, and Friday. 60 tablet 0   No current facility-administered medications on file prior to visit.    Review of System: ROS Comprehensive ROS Pertinent positive and negative noted in HPI   Objective:  BP (!) 151/72 (BP  Location: Right Arm, Patient Position: Sitting, Cuff Size: Normal)   Pulse 87   Temp 97.9 F (36.6 C) (Oral)   Resp 14   Ht 5' 2 (1.575 m)   Wt 131 lb 9.6 oz (59.7 kg)   LMP 03/04/2024   SpO2 97%   BMI 24.07 kg/m   Filed Weights   04/20/24 1343  Weight: 131 lb 9.6 oz (59.7 kg)    Physical Exam Vitals reviewed.  Constitutional:      Appearance: She is ill-appearing.  HENT:     Head: Normocephalic.     Right Ear: External ear normal.     Left Ear: Ear canal and external ear normal.     Nose: Nose normal.     Mouth/Throat:     Mouth: Mucous membranes are moist.  Eyes:      Extraocular Movements: Extraocular movements intact.  Cardiovascular:     Rate and Rhythm: Normal rate and regular rhythm.  Pulmonary:     Effort: Pulmonary effort is normal.     Breath sounds: Normal breath sounds.  Abdominal:     General: Bowel sounds are normal.     Palpations: Abdomen is soft.  Musculoskeletal:        General: Normal range of motion.     Cervical back: Normal range of motion.     Comments: Port ru chest and fistula left upper arm both bruits . Thrill in fistula  Skin:    General: Skin is warm and dry.  Neurological:     Mental Status: She is alert and oriented to person, place, and time.  Psychiatric:        Mood and Affect: Mood normal.        Behavior: Behavior normal.        Thought Content: Thought content normal.      Assessment:  ,Alfred was seen today for hospitalization follow-up.  Diagnoses and all orders for this visit:  Hospital discharge follow-up  Type 2 diabetes mellitus with hyperglycemia, with long-term current use of insulin  (HCC) A1C 7.1 You are prediabetic .  monitor carbohydrates -rice, potatoes, tortillas, [place] Corn Masa Flour, breads, pasta, sweets, sodas.  Increase exercising to help maintain appropriate weight.   Symptomatic anemia -     CBC with Differential  Essential hypertension 2/2 Acute kidney injury superimposed on stage 3b chronic kidney disease (HCC BP goal - < 130/80 Explained that having normal blood pressure is the goal and medications are helping to get to goal and maintain normal blood pressure. DIET: Limit salt intake, read nutrition labels to check salt content, limit fried and high fatty foods  Avoid using multisymptom OTC cold preparations that generally contain sudafed which can rise BP. Consult with pharmacist on best cold relief products to use for persons with HTN EXERCISE Discussed incorporating exercise such as walking - 30 minutes most days of the week and can do in 10 minute intervals    -      CMP14+EGFR  Language barrier I talked to Melissa  Generalized anxiety disorder -     hydrOXYzine  (ATARAX ) 25 MG tablet; Take 1 tablet (25 mg total) by mouth 3 (three) times daily as needed.     This note has been created with Education officer, environmental. Any transcriptional errors are unintentional.    Rosaline SHAUNNA Bohr, NP 04/20/2024, 1:49 PM

## 2024-04-20 NOTE — Patient Instructions (Signed)
 Visit Information  Thank you for taking time to visit with me today. Please don't hesitate to contact me if I can be of assistance to you before our next scheduled telephone appointment.   Following is a copy of your care plan:   Goals Addressed             This Visit's Progress    VBCI Transitions of Care (TOC) Care Plan       Problems:  Recent Hospitalization for treatment of Symptomatic Anemia, newly diagnosed ESRD Knowledge Deficit Related to language barrier and No Hospital Follow Up Provider appointment -scheduled on 04/20/24  Goal:  Over the next 30 days, the patient will not experience hospital readmission  Interventions:  Transitions of Care: Doctor Visits  - discussed the importance of doctor visits Utilized Spanish Interpreter [phone], via [employer] Medication review-discussed insulin  dose change  Discussed patient is attending dialysis on TTS Provided education on HTN, patient checking BP daily, advised to write down readings and share with provider Discussed BS readings, patient checking fasting, fasting today was 204, advised to record and share with provider Advised patient to exercise 30 minutes a day for 5 days a week Collaborated with PCP regarding recommendation for insulin  dose(patient was taking 26 units prior to admission, taking 13 units since discharge)  Patient Self Care Activities:  Attend all scheduled provider appointments Call provider office for new concerns or questions  Notify RN Care Manager of TOC call rescheduling needs Participate in Transition of Care Program/Attend TOC scheduled calls Take medications as prescribed    Plan:  Telephone follow up appointment with care management team member scheduled for:  04/29/24 at 12:45pm        Patient verbalizes understanding of instructions and care plan provided today and agrees to view in MyChart. Active MyChart status and patient understanding of how to access instructions and care  plan via MyChart confirmed with patient.     Telephone follow up appointment with care management team member scheduled for:04/29/24 at 12:45pm  Please call the care guide team at [phone] if you need to cancel or reschedule your appointment.   Please call 1-800-273-TALK (toll free, 24 hour hotline) go to [facility] Urgent [facility] [address], [place] [phone]) call 911 if you are experiencing a Mental Health or Behavioral Health Crisis or need someone to talk to.  Andrea Dimes RN, BSN [place]  [facility] RN Care Manager [phone]

## 2024-04-20 NOTE — Patient Instructions (Signed)
 Center for [facility] at [facility] Follow up in 2 week(s).   Specialty: Obstetrics and Gynecology Contact information: [address] [place]  [address] [phone]

## 2024-04-20 NOTE — Transitions of Care (Post Inpatient/ED Visit) (Signed)
 Transition of Care week 3  Visit Note  04/20/2024  Name: Michele Morales MRN: [id number]          DOB: May 10, 1976  Situation: Patient enrolled in [facility] 30-day program. Visit completed with Ms. Matson by telephone.   Background:   Initial Transition Care Management Follow-up Telephone Call    Past Medical History:  Diagnosis Date   Diabetes mellitus without complication (HCC)    IDDM   Hypercholesteremia    Hypertension    Neuromuscular disorder (HCC)    diabetic neuropathy feet    Assessment: Patient Reported Symptoms: Cognitive Cognitive Status: Able to follow simple commands, Alert and oriented to person, place, and time, Normal speech and language skills (Spanish interpreter utilized)      Neurological Neurological Review of Symptoms: No symptoms reported    HEENT HEENT Symptoms Reported: Not assessed      Cardiovascular Cardiovascular Symptoms Reported: Swelling in legs or feet Does patient have uncontrolled Hypertension?: Yes Is patient checking Blood Pressure at home?: Yes Patient's Recent BP reading at home: BP today in PCP office 151/72. Patient reports slight swelling in feet. Cardiovascular Management Strategies: Routine screening, Medication therapy Cardiovascular Self-Management Outcome: 3 (uncertain)  Respiratory Respiratory Symptoms Reported: No symptoms reported    Endocrine Endocrine Symptoms Reported: No symptoms reported Is patient diabetic?: Yes Is patient checking blood sugars at home?: Yes List most recent blood sugar readings, include date and time of day: Fasting this am was 204 Endocrine Self-Management Outcome: 3 (uncertain) Endocrine Comment: PCP office visit today. No changes to insulin  dose.  Gastrointestinal Gastrointestinal Symptoms Reported: No symptoms reported      Genitourinary Genitourinary Symptoms Reported: No symptoms reported Genitourinary Management Strategies: Hemodialysis Hemodialysis Schedule: TTS Hemodialysis Last  Treatment: 04/19/24 Genitourinary Self-Management Outcome: 4 (good)  Integumentary Integumentary Symptoms Reported: No symptoms reported    Musculoskeletal Musculoskelatal Symptoms Reviewed: No symptoms reported        Psychosocial Psychosocial Symptoms Reported: Not assessed         There were no vitals filed for this visit.  Medications Reviewed Today     Reviewed by Lucky Andrea LABOR, RN (Registered Nurse) on 04/20/24 at 1537  Med List Status: <None>   Medication Order Taking? Sig Documenting Provider Last Dose Status Informant  cyanocobalamin  1000 MCG tablet [id number]  Take 0.5 tablets (500 mcg total) by mouth daily.  Patient not taking: Reported on 04/20/2024   Caleen Burgess BROCKS, MD  Active   Darbepoetin Alfa  (ARANESP ) 100 MCG/0.5ML SOSY injection [id number] Yes Inject 0.5 mLs (100 mcg total) into the skin every Sunday at 6pm. Amin, Ankit C, MD  Active   Dulaglutide  (TRULICITY ) 1.5 MG/0.5ML EMMANUEL [id number] Yes Inject 1.5 mg into the skin once a week. Celestia Rosaline SQUIBB, NP  Active Self, Pharmacy Records           Med Note [facility], ERIN T   Sat Mar 19, 2024  8:09 AM) Pt receives this injection on Mondays   glucose blood (TRUE METRIX BLOOD GLUCOSE TEST) test strip [id number] Yes Use to check blood sugar three times daily. Newlin, Enobong, MD  Active Self, Pharmacy Records  hydrALAZINE  (APRESOLINE ) 50 MG tablet [id number] Yes Take 1 tablet (50 mg total) by mouth every 8 (eight) hours. Caleen Burgess BROCKS, MD  Active   hydrOXYzine  (ATARAX ) 25 MG tablet [id number] Yes Take 1 tablet (25 mg total) by mouth 3 (three) times daily as needed. Celestia Rosaline SQUIBB, NP  Active   Insulin  Glargine (BASAGLAR  KWIKPEN) 100 UNIT/ML [id number] Yes  Inject 13 Units into the skin daily. Caleen Burgess BROCKS, MD  Active   Insulin  Pen Needle (PENTIPS) 32G X 4 MM MISC [id number] Yes use as directed Mayers, Cari S, PA-C  Active Self, Pharmacy Records  labetalol  (NORMODYNE ) 300 MG tablet [id number] Yes Take 1 tablet (300 mg total) by  mouth 2 (two) times daily. Caleen Burgess BROCKS, MD  Active   megestrol  (MEGACE ) 40 MG tablet [id number] Yes Take 1 tablet (40 mg total) by mouth daily. Caleen Burgess BROCKS, MD  Active   pantoprazole  (PROTONIX ) 40 MG tablet [id number] Yes Take 1 tablet (40 mg total) by mouth 2 (two) times daily before a meal. Amin, Ankit C, MD  Active   polyethylene glycol powder (GLYCOLAX /MIRALAX ) 17 GM/SCOOP powder [id number] Yes Take 17 g by mouth daily as needed. Caleen Burgess BROCKS, MD  Active   rosuvastatin  (CRESTOR ) 10 MG tablet [id number] Yes Take 1 tablet (10 mg total) by mouth daily. Mayers, Kirk RAMAN, PA-C  Active Self, Pharmacy Records           Med Note (MARROW, ERIN T   Sat Mar 19, 2024  8:12 AM) Unable to confirm recent fill history; however, pt reports still taking  senna (SENOKOT) 8.6 MG TABS tablet [id number] Yes Take 1 tablet (8.6 mg total) by mouth daily. Caleen Burgess BROCKS, MD  Active   TRUEplus Lancets 28G MISC [id number] Yes Use to check blood sugar three times daily. Newlin, Enobong, MD  Active Self, Pharmacy Records  vitamin D3 (CHOLECALCIFEROL ) 25 MCG tablet [id number] Yes Take 5 tablets (5,000 Units total) by mouth every Monday, Wednesday, and Friday. Caleen Burgess BROCKS, MD  Active             Recommendation:   Secure Communication to PCP regarding insulin  dose  Follow Up Plan:   Telephone follow-up in 1 week  Andrea Dimes RN, BSN [place]  [facility] RN Care Manager [phone]

## 2024-04-21 ENCOUNTER — Other Ambulatory Visit: Payer: Self-pay

## 2024-04-21 LAB — CMP14+EGFR
ALT: 29 IU/L (ref 0–32)
AST: 18 IU/L (ref 0–40)
Albumin: 4.3 g/dL (ref 3.9–4.9)
Alkaline Phosphatase: 202 IU/L — ABNORMAL HIGH (ref 44–121)
BUN/Creatinine Ratio: 12 (ref 9–23)
BUN: 44 mg/dL — ABNORMAL HIGH (ref 6–24)
Bilirubin Total: 0.4 mg/dL (ref 0.0–1.2)
CO2: 26 mmol/L (ref 20–29)
Calcium: 9.3 mg/dL (ref 8.7–10.2)
Chloride: 95 mmol/L — ABNORMAL LOW (ref 96–106)
Creatinine, Ser: 3.74 mg/dL — ABNORMAL HIGH (ref 0.57–1.00)
Globulin, Total: 2.6 g/dL (ref 1.5–4.5)
Glucose: 230 mg/dL — ABNORMAL HIGH (ref 70–99)
Potassium: 4.2 mmol/L (ref 3.5–5.2)
Sodium: 138 mmol/L (ref 134–144)
Total Protein: 6.9 g/dL (ref 6.0–8.5)
eGFR: 14 mL/min/1.73 — ABNORMAL LOW (ref >59)

## 2024-04-21 LAB — CBC WITH DIFFERENTIAL/PLATELET
Basophils Absolute: 0.1 x10E3/uL (ref 0.0–0.2)
Basos: 1 %
EOS (ABSOLUTE): 0.2 x10E3/uL (ref 0.0–0.4)
Eos: 2 %
Hematocrit: 32.1 % — ABNORMAL LOW (ref 34.0–46.6)
Hemoglobin: 10.2 g/dL — ABNORMAL LOW (ref 11.1–15.9)
Immature Grans (Abs): 0 x10E3/uL (ref 0.0–0.1)
Immature Granulocytes: 0 %
Lymphocytes Absolute: 1.3 x10E3/uL (ref 0.7–3.1)
Lymphs: 14 %
MCH: 29.9 pg (ref 26.6–33.0)
MCHC: 31.8 g/dL (ref 31.5–35.7)
MCV: 94 fL (ref 79–97)
Monocytes Absolute: 0.8 x10E3/uL (ref 0.1–0.9)
Monocytes: 9 %
Neutrophils Absolute: 6.4 x10E3/uL (ref 1.4–7.0)
Neutrophils: 73 %
Platelets: 219 x10E3/uL (ref 150–450)
RBC: 3.41 x10E6/uL — ABNORMAL LOW (ref 3.77–5.28)
RDW: 13.9 % (ref 11.7–15.4)
WBC: 8.9 x10E3/uL (ref 3.4–10.8)

## 2024-04-26 ENCOUNTER — Ambulatory Visit (INDEPENDENT_AMBULATORY_CARE_PROVIDER_SITE_OTHER): Payer: Self-pay | Admitting: Primary Care

## 2024-04-29 ENCOUNTER — Encounter: Payer: Self-pay | Admitting: *Deleted

## 2024-04-29 ENCOUNTER — Telehealth: Payer: Self-pay | Admitting: *Deleted

## 2024-05-02 ENCOUNTER — Telehealth: Payer: Self-pay | Admitting: *Deleted

## 2024-05-02 ENCOUNTER — Other Ambulatory Visit: Payer: Self-pay

## 2024-05-02 ENCOUNTER — Other Ambulatory Visit (INDEPENDENT_AMBULATORY_CARE_PROVIDER_SITE_OTHER): Payer: Self-pay | Admitting: Primary Care

## 2024-05-02 DIAGNOSIS — E1165 Type 2 diabetes mellitus with hyperglycemia: Secondary | ICD-10-CM

## 2024-05-02 MED ORDER — LANTUS SOLOSTAR 100 UNIT/ML ~~LOC~~ SOPN
26.0000 [IU] | PEN_INJECTOR | Freq: Every day | SUBCUTANEOUS | 2 refills | Status: AC
  Filled 2024-05-02: qty 6, 23d supply, fill #0

## 2024-05-02 NOTE — Patient Instructions (Signed)
 Visit Information  Thank you for taking time to visit with me today. Please don't hesitate to contact me if I can be of assistance to you before our next scheduled telephone appointment.   Following is a copy of your care plan:   Goals Addressed             This Visit's Progress    VBCI Transitions of Care (TOC) Care Plan       Problems:  Recent Hospitalization for treatment of Symptomatic Anemia, newly diagnosed ESRD Knowledge Deficit Related to language barrier and No Hospital Follow Up Provider appointment -scheduled on 04/20/24  Goal:  Over the next 30 days, the patient will not experience hospital readmission  Interventions:  Transitions of Care: Doctor Visits  - discussed the importance of doctor visits Utilized Spanish Interpreter [phone], via [employer] Medication review-reviewed increasing insulin  slowly as advised by Pharmacist, Herlene Discussed patient is attending dialysis on TTS Provided education on HTN, patient checking BP daily, advised to write down readings and share with provider Discussed BS readings, patient checking fasting, fasting today was 163, advised to record and share with provider Advised patient to exercise 30 minutes a day for 5 days a week Collaborated with PCP regarding patient needing refills for Hydralazine , Labetalol  and Megace   Patient Self Care Activities:  Attend all scheduled provider appointments Call provider office for new concerns or questions  Notify RN Care Manager of TOC call rescheduling needs Participate in Transition of Care Program/Attend TOC scheduled calls Take medications as prescribed    Plan:  Telephone follow up appointment with care management team member scheduled for:  05/05/24 at 2pm        Patient verbalizes understanding of instructions and care plan provided today and agrees to view in MyChart. Active MyChart status and patient understanding of how to access instructions and care plan via MyChart  confirmed with patient.     Telephone follow up appointment with care management team member scheduled for:05/05/24 at 2pm  Please call the care guide team at [phone] if you need to cancel or reschedule your appointment.   Please call 1-800-273-TALK (toll free, 24 hour hotline) go to [facility] Urgent [facility] [address], [place] [phone]) call 911 if you are experiencing a Mental Health or Behavioral Health Crisis or need someone to talk to.  Andrea Dimes RN, BSN [place]  [facility] RN Care Manager [phone]

## 2024-05-02 NOTE — Transitions of Care (Post Inpatient/ED Visit) (Signed)
 Transition of Care week 4  Visit Note  05/02/2024  Name: Michele Morales MRN: [id number]          DOB: November 23, 1975  Situation: Patient enrolled in [facility] 30-day program. Visit completed with Ms. Petropoulos by telephone.   Background:   Initial Transition Care Management Follow-up Telephone Call    Past Medical History:  Diagnosis Date   Diabetes mellitus without complication (HCC)    IDDM   Hypercholesteremia    Hypertension    Neuromuscular disorder (HCC)    diabetic neuropathy feet    Assessment: Patient Reported Symptoms: Cognitive Cognitive Status: Able to follow simple commands, Alert and oriented to person, place, and time, Normal speech and language skills (Spanish interpreter utilized)      Neurological Neurological Review of Symptoms: Vision changes Neurological Self-Management Outcome: 3 (uncertain) Neurological Comment: Patient reports being told to follow up with specialist, but has not followed up.  HEENT HEENT Symptoms Reported: Not assessed      Cardiovascular Does patient have uncontrolled Hypertension?: Yes Is patient checking Blood Pressure at home?: Yes Patient's Recent BP reading at home: BP at dialysis has been normal. Patient unable to provide BP readings Cardiovascular Self-Management Outcome: 3 (uncertain)  Respiratory Respiratory Symptoms Reported: No symptoms reported    Endocrine Endocrine Symptoms Reported: No symptoms reported Is patient diabetic?: Yes Is patient checking blood sugars at home?: Yes List most recent blood sugar readings, include date and time of day: Fasting this am was 163 Endocrine Self-Management Outcome: 3 (uncertain) Endocrine Comment: Discussed increasing Basaglar  as directed by Pharmacist to 20 units for one week and Morales BS. If BS readings continue to be high >140, can increase to 26 units.  Gastrointestinal Gastrointestinal Symptoms Reported: No symptoms reported      Genitourinary Genitourinary Symptoms Reported: No  symptoms reported Genitourinary Comment: Patient reports vaginal bleeding has improved with Megace . Scheduled with GYN on 05/20/24.  Integumentary Integumentary Symptoms Reported: No symptoms reported    Musculoskeletal Musculoskelatal Symptoms Reviewed: No symptoms reported        Psychosocial Psychosocial Symptoms Reported: Not assessed         There were no vitals filed for this visit.  Medications Reviewed Today     Reviewed by Lucky Andrea LABOR, RN (Registered Nurse) on 05/02/24 at 1555  Med List Status: <None>   Medication Order Taking? Sig Documenting Provider Last Dose Status Informant  cyanocobalamin  1000 MCG tablet [id number]  Take 0.5 tablets (500 mcg total) by mouth daily.  Patient not taking: Reported on 05/02/2024   Caleen Burgess BROCKS, MD  Active   Darbepoetin Alfa  (ARANESP ) 100 MCG/0.5ML SOSY injection [id number] Yes Inject 0.5 mLs (100 mcg total) into the skin every Sunday at 6pm. Amin, Ankit C, MD  Active   Dulaglutide  (TRULICITY ) 1.5 MG/0.5ML EMMANUEL [id number] Yes Inject 1.5 mg into the skin once a week. Celestia Rosaline SQUIBB, NP  Active Self, Pharmacy Records           Med Note [facility] ERIN T   Sat Mar 19, 2024  8:09 AM) Pt receives this injection on Mondays   glucose blood (TRUE METRIX BLOOD GLUCOSE TEST) test strip [id number] Yes Use to check blood sugar three times daily. Newlin, Enobong, MD  Active Self, Pharmacy Records  hydrALAZINE  (APRESOLINE ) 50 MG tablet [id number] Yes Take 1 tablet (50 mg total) by mouth every 8 (eight) hours. Caleen Burgess BROCKS, MD  Active   hydrOXYzine  (ATARAX ) 25 MG tablet [id number] Yes Take 1 tablet (25 mg total) by mouth  3 (three) times daily as needed. Celestia Rosaline SQUIBB, NP  Active   Insulin  Glargine (BASAGLAR  KWIKPEN) 100 UNIT/ML [id number] Yes Inject 13 Units into the skin daily. Caleen Burgess BROCKS, MD  Active   Insulin  Pen Needle (PENTIPS) 32G X 4 MM MISC [id number] Yes use as directed Mayers, Cari S, PA-C  Active Self, Pharmacy Records  labetalol   (NORMODYNE ) 300 MG tablet [id number] Yes Take 1 tablet (300 mg total) by mouth 2 (two) times daily. Caleen Burgess BROCKS, MD  Active   megestrol  (MEGACE ) 40 MG tablet [id number] Yes Take 1 tablet (40 mg total) by mouth daily. Caleen Burgess BROCKS, MD  Active   pantoprazole  (PROTONIX ) 40 MG tablet [id number] Yes Take 1 tablet (40 mg total) by mouth 2 (two) times daily before a meal. Amin, Ankit C, MD  Active   polyethylene glycol powder (GLYCOLAX /MIRALAX ) 17 GM/SCOOP powder [id number] Yes Take 17 g by mouth daily as needed. Caleen Burgess BROCKS, MD  Active   rosuvastatin  (CRESTOR ) 10 MG tablet [id number] Yes Take 1 tablet (10 mg total) by mouth daily. Mayers, Kirk RAMAN, PA-C  Active Self, Pharmacy Records           Med Note (MARROW, ERIN T   Sat Mar 19, 2024  8:12 AM) Unable to confirm recent fill history; however, pt reports still taking  senna (SENOKOT) 8.6 MG TABS tablet [id number] Yes Take 1 tablet (8.6 mg total) by mouth daily. Caleen Burgess BROCKS, MD  Active   TRUEplus Lancets 28G MISC [id number] Yes Use to check blood sugar three times daily. Newlin, Enobong, MD  Active Self, Pharmacy Records  vitamin D3 (CHOLECALCIFEROL ) 25 MCG tablet [id number] Yes Take 5 tablets (5,000 Units total) by mouth every Monday, Wednesday, and Friday. Caleen Burgess BROCKS, MD  Active             Recommendation:   Continue Current Plan of Care  Follow Up Plan:   Telephone follow-up in 1 week  Andrea Dimes RN, BSN [place]  [facility] RN Care Manager [phone]

## 2024-05-05 ENCOUNTER — Encounter: Payer: Self-pay | Admitting: *Deleted

## 2024-05-05 ENCOUNTER — Telehealth: Payer: Self-pay | Admitting: *Deleted

## 2024-05-06 ENCOUNTER — Ambulatory Visit: Payer: Self-pay

## 2024-05-06 ENCOUNTER — Encounter: Payer: Self-pay | Admitting: *Deleted

## 2024-05-06 ENCOUNTER — Other Ambulatory Visit (INDEPENDENT_AMBULATORY_CARE_PROVIDER_SITE_OTHER): Payer: Self-pay | Admitting: Primary Care

## 2024-05-06 NOTE — Telephone Encounter (Signed)
 FYI Only or Action Required?: Action required by provider: medication refill request.  Patient was last seen in primary care on 04/20/2024 by Celestia Rosaline SQUIBB, NP.  Called Nurse Triage reporting Heart Problem and Medication Refill.  Symptoms began today.  Interventions attempted: Nothing.  Symptoms are: gradually improving.  Triage Disposition: Call PCP Now  Patient/caregiver understands and will follow disposition?: Unsure      Copied from CRM [id number]. Topic: Clinical - Red Word Triage >> May 06, 2024  9:02 AM Mia F wrote: Red Word that prompted transfer to Nurse Triage: Pt daughter says Pt says she is feeling like her heart is about to pound out of her chest. May have mentioned a headache. Not sure if pt has checked her bp. Daughter is not with pt but called in asking for a refill on pt medication since tried getting meds from pharmacy but was told to contact pcp office. She is now contacting pcp office after being without the meds for one or two days. Reason for Disposition  [1] Prescription refill request for ESSENTIAL medicine (i.e., likelihood of harm to patient if not taken) AND [2] triager unable to refill per department policy  Answer Assessment - Initial Assessment Questions 1. BLOOD PRESSURE: What is your blood pressure? Did you take at least two measurements 5 minutes apart?     Reports around 148/90 2. ONSET: When did you take your blood pressure?     Today  3. HOW: How did you take your blood pressure? (e.g., automatic home BP monitor, visiting nurse)     auto 4. HISTORY: Do you have a history of high blood pressure?     yes 5. MEDICINES: Are you taking any medicines for blood pressure? Have you missed any doses recently?     Yes, but unknown name - triager confirmed hydrALAZINE  (APRESOLINE ) 50 MG tablet Daughter reports that pt only has 1 pill left and needs refill 6. OTHER SYMPTOMS: Do you have any symptoms? (e.g., blurred vision, chest pain,  difficulty breathing, headache, weakness)     Lightheaded - improving 7. PREGNANCY: Is there any chance you are pregnant? When was your last menstrual period?     N/a  Answer Assessment - Initial Assessment Questions 1. DRUG NAME: What medicine do you need to have refilled?     hydrALAZINE  (APRESOLINE ) 50 MG tablet 2. REFILLS REMAINING: How many refills are remaining? Notes: The label on the medicine or pill bottle will show how many refills are remaining. If there are no refills remaining, then a renewal may be needed.     0 3. EXPIRATION DATE: What is the expiration date? Note: The label states when the prescription will expire, and thus can no longer be refilled.)     N/a 4. PRESCRIBER: Who prescribed it? Note: The prescribing doctor or group is responsible for refill approvals.SABRA Dare, Burgess BROCKS, MD 5. PHARMACY: Have you contacted your pharmacy (drugstore)? Note: Some pharmacies will contact the doctor (or NP/PA).      [facility] [address], [address], [place] [address] Phone: [phone]  Fax: [phone]   6. SYMPTOMS: Do you have any symptoms?     Was previously lightheaded, reports improving 7. PREGNANCY: Is there any chance that you are pregnant? When was your last menstrual period?     N/a    Patient daughter, Alfonse calling initially reporting lightheadedness. Upon further investigation, Alfonse was able to facetime mother and clarify  that mother needs a refill on HTN medication. Pt was unable to identify medication, but triager reviewed med list and confirmed with pt that she needed hydrALAZINE  (APRESOLINE ) 50 MG tablet. Pt reports she only has 1 pill left.  Triager strongly advised close monitoring of BP and to call back for any worsening sx. Caregiver Patient verbalized understanding and to call back/go to UC with worsening symptoms.  Triager will request Rx refill in separate  encounter.  Protocols used: Blood Pressure - High-A-AH, Medication Refill and Renewal Call-A-AH

## 2024-05-06 NOTE — Telephone Encounter (Signed)
 Unable to reach. No voicemail set up. No answer.  Did not see the phone number listed to reach Alfonse, patient's daughter.

## 2024-05-09 ENCOUNTER — Encounter: Payer: Self-pay | Admitting: *Deleted

## 2024-05-09 ENCOUNTER — Other Ambulatory Visit (INDEPENDENT_AMBULATORY_CARE_PROVIDER_SITE_OTHER): Payer: Self-pay | Admitting: Primary Care

## 2024-05-09 ENCOUNTER — Other Ambulatory Visit: Payer: Self-pay

## 2024-05-09 MED ORDER — HYDRALAZINE HCL 50 MG PO TABS
50.0000 mg | ORAL_TABLET | Freq: Three times a day (TID) | ORAL | 0 refills | Status: AC
  Filled 2024-05-09: qty 90, 30d supply, fill #0

## 2024-05-10 ENCOUNTER — Other Ambulatory Visit: Payer: Self-pay

## 2024-05-11 ENCOUNTER — Other Ambulatory Visit: Payer: Self-pay

## 2024-05-11 NOTE — Telephone Encounter (Signed)
 Patient was sent refills to pharmacy 05/09/2024

## 2024-05-17 ENCOUNTER — Other Ambulatory Visit: Payer: Self-pay

## 2024-05-17 MED ORDER — LOSARTAN POTASSIUM 25 MG PO TABS
25.0000 mg | ORAL_TABLET | Freq: Every day | ORAL | 3 refills | Status: AC
  Filled 2024-05-17: qty 90, 90d supply, fill #0

## 2024-05-20 ENCOUNTER — Ambulatory Visit: Payer: Self-pay | Admitting: Obstetrics & Gynecology

## 2024-05-20 ENCOUNTER — Other Ambulatory Visit: Payer: Self-pay

## 2024-05-20 ENCOUNTER — Encounter: Payer: Self-pay | Admitting: Obstetrics & Gynecology

## 2024-05-20 VITALS — BP 123/65 | HR 109 | Wt 126.7 lb

## 2024-05-20 DIAGNOSIS — I120 Hypertensive chronic kidney disease with stage 5 chronic kidney disease or end stage renal disease: Secondary | ICD-10-CM

## 2024-05-20 DIAGNOSIS — N8003 Adenomyosis of the uterus: Secondary | ICD-10-CM | POA: Diagnosis not present

## 2024-05-20 DIAGNOSIS — N939 Abnormal uterine and vaginal bleeding, unspecified: Secondary | ICD-10-CM | POA: Diagnosis not present

## 2024-05-20 DIAGNOSIS — Z1331 Encounter for screening for depression: Secondary | ICD-10-CM

## 2024-05-20 DIAGNOSIS — Z1211 Encounter for screening for malignant neoplasm of colon: Secondary | ICD-10-CM | POA: Diagnosis not present

## 2024-05-20 DIAGNOSIS — Z1231 Encounter for screening mammogram for malignant neoplasm of breast: Secondary | ICD-10-CM | POA: Diagnosis not present

## 2024-05-20 MED ORDER — MEGESTROL ACETATE 40 MG PO TABS
40.0000 mg | ORAL_TABLET | Freq: Every day | ORAL | 2 refills | Status: AC
  Filled 2024-05-20: qty 30, 30d supply, fill #0

## 2024-05-20 NOTE — Progress Notes (Signed)
 Patient is Spanish-speaking only, interpreter present for this encounter.  GYNECOLOGY OFFICE VISIT NOTE  History:  Michele Morales is a 48 y.o. 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 Was hospitalized last month for her ESRD, and dialysis was initiated.  She had AUB, which was treated successfully with Megace .  Pelvic ultrasound done 03/19/24 showed 15 mm endometrial thickness and adenomyosis.  She was told to follow up here for further management.  She reports that she ran out of the Megace , and she is having light bleeding now.  Recent hemoglobin as per her dialysis notes in [facility] was 10.0 on 05/12/24; she gets Venofer  during her dialysis sessions.  She denies any abnormal vaginal discharge, pelvic pain or other concerns.  Past Medical History:  Diagnosis Date   Chronic kidney disease on chronic dialysis (HCC)    Closed fracture of distal end of right radius 10/15/2022   Diabetes mellitus without complication (HCC)    IDDM   End stage renal disease (HCC) 03/18/2024   Hypercholesteremia    Hypertension    Neuromuscular disorder (HCC)    diabetic neuropathy feet   Shortness of breath 04/01/2024   Type 2 diabetes mellitus with diabetic chronic kidney disease (HCC) 03/30/2024    Past Surgical History:  Procedure Laterality Date   AV FISTULA PLACEMENT Left 03/30/2024   Procedure: BRACHIOBASILIC ARTERIOVENOUS (AV) FISTULA CREATION;  Surgeon: Serene Gaile ORN, MD;  Location: [facility] OR;  Service: Vascular;  Laterality: Left;   hand procedure Right    right wrist - used sedation to reset in the ER Dept   INSERTION OF DIALYSIS CATHETER Right 03/30/2024   Procedure: INSERTION OF DIALYSIS CATHETER USING 23CM PALINDROME DUAL LUMEN CATHETER;  Surgeon: Serene Gaile ORN, MD;  Location: [facility] OR;  Service: Vascular;  Laterality: Right;   IR FLUORO GUIDE CV LINE RIGHT   03/22/2024   IR US  GUIDE VASC ACCESS RIGHT  03/22/2024   ORIF WRIST FRACTURE Right 10/15/2022   Procedure: OPEN REDUCTION INTERNAL FIXATION (ORIF) WRIST FRACTURE;  Surgeon: Shari Easter, MD;  Location: [facility] OR;  Service: Orthopedics;  Laterality: Right;  regional with iv sedation    The following portions of the patient's history were reviewed and updated as appropriate: allergies, current medications, past family history, past medical history, past social history, past surgical history and problem list.   Health Maintenance:  Normal pap at [facility].  Never had a mammogram .   Review of Systems:  Pertinent items noted in HPI and remainder of comprehensive ROS otherwise negative.  Physical Exam:  BP 123/65   Pulse (!) 109   Wt 126 lb 11.2 oz (57.5 kg)   BMI 23.17 kg/m  CONSTITUTIONAL: Well-developed, well-nourished female in no acute distress.  HEENT:  Normocephalic, atraumatic. External right and left ear normal. No scleral icterus.  NECK: Normal range of motion, supple, no masses noted on observation SKIN: No rash noted. Not diaphoretic. No erythema. No pallor. MUSCULOSKELETAL: Normal range of motion. No edema noted. NEUROLOGIC: Alert and oriented to person, place, and time. Normal muscle tone coordination. No cranial nerve deficit noted on observation. PSYCHIATRIC: Normal mood and affect. Normal behavior. Normal judgment and thought content. CARDIOVASCULAR: Normal heart rate noted RESPIRATORY: Effort and breath sounds normal, no problems with respiration noted ABDOMEN: No masses or other overt distention noted on observation. No tenderness.   PELVIC: Deferred  Labs and Imaging US  PELVIS (  TRANSABDOMINAL ONLY) Result Date: 03/19/2024 CLINICAL DATA:  Abnormal uterine bleeding. EXAM: TRANSABDOMINAL ULTRASOUND OF PELVIS TECHNIQUE: Transabdominal ultrasound examination of the pelvis was performed including evaluation of the uterus, ovaries, adnexal regions, and pelvic cul-de-sac. COMPARISON:   None Available. FINDINGS: Uterus Measurements: 10.6 x 4.7 x 5.3 cm = volume: 138 mL. The uterus demonstrates a heterogeneous echotexture with findings suspicious for adenomyosis. Endometrium Thickness: 14 mm. No focal abnormality visualized. There is indistinct junctional zone. Right ovary Measurements: 2.8 x 1.6 x 2.5 cm = volume: 6.1 mL. There is a 2 cm cyst in the right ovary. Left ovary Measurements: 4.1 x 2.6 x 3.4 cm = volume: 19 mL. Normal appearance/no adnexal mass. No free fluid in the pelvis. Faint echogenic floating debris in the urinary bladder. Correlation with urinalysis recommended to exclude UTI. IMPRESSION: 1. Heterogeneous uterus with findings suggestive of adenomyosis. 2. Unremarkable ovaries. Electronically Signed   By: Vanetta Chou M.D.   On: 03/19/2024 10:20   Assessment and Plan:    1. Colon cancer screening Colon cancer screening discussed, she wants Cologuard. This was ordered. She is aware that abnormal results will need to be followed by colonoscopy. - Cologuard  2. Breast cancer screening by mammogram Screening mammogram scheduled - [facility] 3D SCREENING MAMMOGRAM BILATERAL BREAST; Future  3. Adenomyosis 4. Abnormal uterine bleeding (AUB) (Primary) Reviewed labs and ultrasound, also reviewed adenomyosis diagnosis, all questions answered.  Recommended pap, endometrial biopsy today; discussed details of procedure. Patient declined this today, wants to come back and premedicate prior to procedure. Also offered concurrent progestin IUD placement for management of AUB/adenomyosis; she is not a good candidate for surgical management at this point due to her co-morbidities.  She will continue Megace  for now, and will decide if she wants IUD at next visit.  - megestrol  (MEGACE ) 40 MG tablet; Take 1 tablet (40 mg total) by mouth daily.  Dispense: 30 tablet; Refill: 2   Routine preventative health maintenance measures emphasized. Please refer to After Visit Summary for other  counseling recommendations.   Return in about 2 weeks (around 06/03/2024) for Pap, endometrial biopsy, progestin IUD placement.    I spent 50 minutes dedicated to the care of this patient including pre-visit review of records, face to face time with the patient discussing her conditions and treatments, post visit ordering of medications and appropriate tests or procedures, coordinating care and documenting this visit encounter.    GLORIS HUGGER, MD, FACOG Obstetrician & Gynecologist, [facility] for [employer], [facility]

## 2024-06-06 ENCOUNTER — Other Ambulatory Visit: Payer: Self-pay

## 2024-06-06 ENCOUNTER — Encounter: Payer: Self-pay | Admitting: Primary Care

## 2024-06-06 MED ORDER — FUROSEMIDE 80 MG PO TABS
ORAL_TABLET | ORAL | 3 refills | Status: AC
  Filled 2024-06-06: qty 50, 88d supply, fill #0

## 2024-06-07 ENCOUNTER — Other Ambulatory Visit: Payer: Self-pay

## 2024-06-21 ENCOUNTER — Other Ambulatory Visit: Payer: Self-pay

## 2024-06-21 MED ORDER — HYDRALAZINE HCL 100 MG PO TABS
100.0000 mg | ORAL_TABLET | Freq: Three times a day (TID) | ORAL | 3 refills | Status: AC
  Filled 2024-06-21: qty 270, 90d supply, fill #0

## 2024-06-23 ENCOUNTER — Other Ambulatory Visit: Payer: Self-pay

## 2024-07-04 ENCOUNTER — Other Ambulatory Visit: Payer: Self-pay

## 2024-07-04 DIAGNOSIS — N186 End stage renal disease: Secondary | ICD-10-CM

## 2024-07-08 ENCOUNTER — Other Ambulatory Visit (HOSPITAL_COMMUNITY)
Admission: RE | Admit: 2024-07-08 | Discharge: 2024-07-08 | Disposition: A | Discharge status: Home / Self Care | Source: Ambulatory Visit | Attending: Obstetrics & Gynecology | Admitting: Obstetrics & Gynecology

## 2024-07-08 ENCOUNTER — Encounter: Payer: Self-pay | Admitting: Obstetrics & Gynecology

## 2024-07-08 ENCOUNTER — Ambulatory Visit (INDEPENDENT_AMBULATORY_CARE_PROVIDER_SITE_OTHER): Admitting: Obstetrics & Gynecology

## 2024-07-08 ENCOUNTER — Other Ambulatory Visit: Payer: Self-pay

## 2024-07-08 ENCOUNTER — Other Ambulatory Visit (HOSPITAL_COMMUNITY)
Admission: RE | Admit: 2024-07-08 | Discharge: 2024-07-08 | Disposition: A | Source: Ambulatory Visit | Attending: Obstetrics & Gynecology | Admitting: Obstetrics & Gynecology

## 2024-07-08 VITALS — BP 180/78 | HR 114 | Wt 127.2 lb

## 2024-07-08 DIAGNOSIS — Z603 Acculturation difficulty: Secondary | ICD-10-CM

## 2024-07-08 DIAGNOSIS — N924 Excessive bleeding in the premenopausal period: Secondary | ICD-10-CM | POA: Insufficient documentation

## 2024-07-08 DIAGNOSIS — Z3202 Encounter for pregnancy test, result negative: Secondary | ICD-10-CM | POA: Diagnosis not present

## 2024-07-08 LAB — POCT PREGNANCY, URINE: Preg Test, Ur: NEGATIVE

## 2024-07-08 MED ORDER — LEVONORGESTREL 20 MCG/DAY IU IUD
1.0000 | INTRAUTERINE_SYSTEM | Freq: Once | INTRAUTERINE | Status: AC
  Administered 2024-07-08: 1 via INTRAUTERINE

## 2024-07-08 NOTE — Progress Notes (Signed)
 See note for IUD insertion.    Patient given informed consent, signed copy in the chart, time out was performed. Appropriate time out taken. . The patient was placed in the lithotomy position and the cervix brought into view with sterile speculum.  Pap test was obtained. Portio of cervix cleansed x 2 with betadine  swabs.  A tenaculum was placed in the anterior lip of the cervix.  The uterus was sounded for depth of 8 cm. A pipelle was introduced to into the uterus, suction created,  and an endometrial sample was obtained. All equipment was removed and accounted for.  The patient tolerated the procedure well.    Patient given post procedure instructions.   Eveline Lynwood MATSU, MD 07/08/2024

## 2024-07-08 NOTE — Progress Notes (Signed)
    GYNECOLOGY OFFICE PROCEDURE NOTE  Trianna Lupien is a 48 y.o. [phone] here for MIrena IUD insertion. She finished taking megace  one week ago. Plan for EMB and pap today as well  IUD Insertion Procedure Note Patient identified, informed consent performed, consent signed.   Discussed risks of irregular bleeding, cramping, infection, malpositioning or misplacement of the IUD outside the uterus which may require further procedure such as laparoscopy. Also discussed >99% contraception efficacy, increased risk of ectopic pregnancy with failure of method.   Emphasized that this did not protect against STIs, condoms recommended during all sexual encounters. Time out was performed.  Chaperone present.  Urine pregnancy test negative.  Speculum placed in the vagina.  Cervix visualized.  Cleaned with Betadine  x 2.  Grasped anteriorly with a single tooth tenaculum.  Uterus sounded to 8 cm.  Mirena IUD placed per manufacturer's recommendations.  Strings trimmed to 3-4 cm. Tenaculum was removed, good hemostasis noted.  Patient tolerated procedure well.   Patient was given post-procedure instructions. Patient was also asked to check IUD strings periodically and follow up in 4 weeks for IUD check.   Eveline Lynwood MATSU, MD  Obstetrician & Gynecologist, [facility] for [facility], [facility]

## 2024-07-11 LAB — SURGICAL PATHOLOGY

## 2024-07-12 LAB — CYTOLOGY - PAP
Comment: NEGATIVE
Comment: NEGATIVE
Comment: NEGATIVE
HPV 16: NEGATIVE
HPV 18 / 45: NEGATIVE
High risk HPV: POSITIVE — AB

## 2024-07-18 ENCOUNTER — Telehealth: Payer: Self-pay | Admitting: Family Medicine

## 2024-07-18 ENCOUNTER — Ambulatory Visit: Payer: Self-pay | Admitting: Obstetrics & Gynecology

## 2024-07-18 NOTE — Telephone Encounter (Signed)
 Called patient to schedule colpo, message was sent in inbasket, patient did not answer I left VM to call back for more information but went ahead and scheduled appt because we are scheduled out a bit. Also sent letter via mail with appt information.

## 2024-07-29 NOTE — Progress Notes (Unsigned)
    Postoperative Access Visit   History of Present Illness   Parrish Daddario is a 48 y.o. year old female who presents for postoperative follow-up for: removal of temporary dialysis catheter, placement of right internal jugular TDC and left brachiobasilic AV fistula creation on 03/30/24 by Dr. Serene. The patient's wounds are *** healed.  The patient notes *** steal symptoms.  The patient is *** able to complete their activities of daily living.  She dialyzes on *** at ***   Physical Examination  There were no vitals filed for this visit. There is no height or weight on file to calculate BMI.  left arm Incision is *** healed, *** radial pulse, hand grip is ***/5, sensation in digits is *** intact, ***palpable thrill, bruit can *** be auscultated     Non Invasive Vascular lab evaluation   VAS US  Duplex Dialysis Access (AVF, AVG): ***  Medical Decision Making   Koralynn Greenspan is a 48 y.o. year old female who presents s/p  removal of temporary dialysis catheter, placement of right internal jugular TDC and left brachiobasilic AV fistula creation on 03/30/24 by Dr. Serene  Patent is without signs or symptoms of steal syndrome Duplex shows *** She will need scheduled for ***   Teretha Damme, PA-C [facility] Office: [phone]  Clinic MD: Serene

## 2024-08-01 ENCOUNTER — Ambulatory Visit (HOSPITAL_COMMUNITY)
Admission: RE | Admit: 2024-08-01 | Discharge: 2024-08-01 | Disposition: A | Source: Ambulatory Visit | Attending: Surgery | Admitting: Surgery

## 2024-08-01 ENCOUNTER — Ambulatory Visit: Admitting: Physician Assistant

## 2024-08-01 VITALS — BP 178/80 | HR 106 | Temp 97.7°F | Wt 127.2 lb

## 2024-08-01 DIAGNOSIS — N186 End stage renal disease: Secondary | ICD-10-CM

## 2024-08-02 ENCOUNTER — Other Ambulatory Visit: Payer: Self-pay

## 2024-08-02 DIAGNOSIS — N186 End stage renal disease: Secondary | ICD-10-CM

## 2024-08-29 ENCOUNTER — Telehealth: Payer: Self-pay | Admitting: Primary Care

## 2024-08-29 NOTE — Telephone Encounter (Signed)
 Called patient for a DM office visit with PCP per Jesslyn Katz RN request, unable to get in contact with patient but voicemail was left.

## 2024-09-06 ENCOUNTER — Other Ambulatory Visit: Payer: Self-pay

## 2024-09-06 NOTE — Progress Notes (Signed)
 PCP -   Celestia Rosaline SQUIBB, NP       Cardiologist -   PPM/ICD - denies Device Orders - n/a Rep Notified - n/a  Chest x-ray - 03-30-24 EKG - 03-18-24 Stress Test - denies ECHO - denies Cardiac Cath - denies  CPAP - denies  GLP-1 -Dulaglutide  (TRULICITY ) Per patient she has not had medication this month  Fasting Blood Sugar - Per patient blood sugar ranges between 130-160 Checks Blood Sugar BID  Blood Thinner Instructions: denies Aspirin  Instructions: denies  ERAS Protcol - NPO  COVID TEST- n/a  Anesthesia review: no  Patient verbally denies any shortness of breath, fever, cough and chest pain during phone call   -------------  SDW INSTRUCTIONS given:  Your procedure is scheduled on September 10, 2023.  Report to [facility] Main Entrance A at 5:30 A.M., and check in at the Admitting office.  Call this number if you have problems the morning of surgery:  [phone]   Remember:  Do not eat or drink after midnight the night before your surgery      Take these medicines the morning of surgery with A SIP OF WATER  hydrALAZINE  (APRESOLINE )  hydrOXYzine  (ATARAX )     WHAT DO I DO ABOUT MY DIABETES MEDICATION?   Do not take oral diabetes medicines (pills) the morning of surgery.        THE MORNING OF SURGERY, take 10 units of LANTUS  insulin .  The day of surgery, do not take other diabetes injectables, including Byetta (exenatide), Bydureon (exenatide ER), Victoza (liraglutide), or Trulicity  (dulaglutide ).  Dulaglutide  (TRULICITY ) Last dose per patient she has not had medication this month  If your CBG is greater than 220 mg/dL, you may take  of your sliding scale (correction) dose of insulin .   HOW TO MANAGE YOUR DIABETES BEFORE AND AFTER SURGERY  Why is it important to control my blood sugar before and after surgery? Improving blood sugar levels before and after surgery helps healing and can limit problems. A way of improving blood sugar control is  eating a healthy diet by:  Eating less sugar and carbohydrates  Increasing activity/exercise  Talking with your doctor about reaching your blood sugar goals High blood sugars (greater than 180 mg/dL) can raise your risk of infections and slow your recovery, so you will need to focus on controlling your diabetes during the weeks before surgery. Make sure that the doctor who takes care of your diabetes knows about your planned surgery including the date and location.  How do I manage my blood sugar before surgery? Check your blood sugar at least 4 times a day, starting 2 days before surgery, to make sure that the level is not too high or low.  Check your blood sugar the morning of your surgery when you wake up and every 2 hours until you get to the Short Stay unit.  If your blood sugar is less than 70 mg/dL, you will need to treat for low blood sugar: Do not take insulin . Treat a low blood sugar (less than 70 mg/dL) with  cup of clear juice (cranberry or apple), 4 glucose tablets, OR glucose gel. Recheck blood sugar in 15 minutes after treatment (to make sure it is greater than 70 mg/dL). If your blood sugar is not greater than 70 mg/dL on recheck, call [phone] for further instructions. Report your blood sugar to the short stay nurse when you get to Short Stay.  If you are admitted to the hospital after surgery: Your  blood sugar will be checked by the staff and you will probably be given insulin  after surgery (instead of oral diabetes medicines) to make sure you have good blood sugar levels. The goal for blood sugar control after surgery is 80-180 mg/dL.   As of today, STOP taking any Aspirin  (unless otherwise instructed by your surgeon) Aleve, Naproxen, Ibuprofen , Motrin , Advil , Goody's, BC's, all herbal medications, fish oil, and all vitamins.                      Do not wear jewelry, make up, or nail polish            Do not wear lotions, powders, perfumes/colognes, or deodorant.             Do not shave 48 hours prior to surgery.  Men may shave face and neck.            Do not bring valuables to the hospital.            [facility] is not responsible for any belongings or valuables.  Do NOT Smoke (Tobacco/Vaping) 24 hours prior to your procedure If you use a CPAP at night, you may bring all equipment for your overnight stay.   Contacts, glasses, dentures or bridgework may not be worn into surgery.      For patients admitted to the hospital, discharge time will be determined by your treatment team.   Patients discharged the day of surgery will not be allowed to drive home, and someone needs to stay with them for 24 hours.    Special instructions:   [facility]- Preparing For Surgery  Before surgery, you can play an important role. Because skin is not sterile, your skin needs to be as free of germs as possible. You can reduce the number of germs on your skin by washing with CHG (chlorahexidine gluconate) Soap before surgery.  CHG is an antiseptic cleaner which kills germs and bonds with the skin to continue killing germs even after washing.    Oral Hygiene is also important to reduce your risk of infection.  Remember - BRUSH YOUR TEETH THE MORNING OF SURGERY WITH YOUR REGULAR TOOTHPASTE  Please do not use if you have an allergy to CHG or antibacterial soaps. If your skin becomes reddened/irritated stop using the CHG.  Do not shave (including legs and underarms) for at least 48 hours prior to first CHG shower. It is OK to shave your face.  Please follow these instructions carefully.   Shower the NIGHT BEFORE SURGERY and the MORNING OF SURGERY with DIAL Soap.   Pat yourself dry with a CLEAN TOWEL.  Wear CLEAN PAJAMAS to bed the night before surgery  Place CLEAN SHEETS on your bed the night of your first shower and DO NOT SLEEP WITH PETS.   Day of Surgery: Please shower morning of surgery  Wear Clean/Comfortable clothing the morning of surgery Do not apply any  deodorants/lotions.   Remember to brush your teeth WITH YOUR REGULAR TOOTHPASTE.   Questions were answered. Patient verbalized understanding of instructions.

## 2024-09-08 NOTE — Anesthesia Preprocedure Evaluation (Addendum)
"                                    Anesthesia Evaluation  Patient identified by MRN, date of birth, ID band Patient awake    Reviewed: Allergy & Precautions, H&P , NPO status , Patient's Chart, lab work & pertinent test results  Airway Mallampati: II  TM Distance: >3 FB Neck ROM: Full    Dental  (+) Chipped, Dental Advisory Given   Pulmonary neg pulmonary ROS   Pulmonary exam normal        Cardiovascular hypertension, Pt. on medications Normal cardiovascular exam     Neuro/Psych negative neurological ROS  negative psych ROS   GI/Hepatic negative GI ROS, Neg liver ROS,,,  Endo/Other  diabetes, Type 2, Insulin  Dependent    Renal/GU ESRF and DialysisRenal disease     Musculoskeletal negative musculoskeletal ROS (+)    Abdominal   Peds  Hematology  (+) Blood dyscrasia, anemia   Anesthesia Other Findings   Reproductive/Obstetrics                              Anesthesia Physical Anesthesia Plan  ASA: 3  Anesthesia Plan: General   Post-op Pain Management: Tylenol  PO (pre-op )*   Induction: Intravenous  PONV Risk Score and Plan: 4 or greater and Ondansetron , Dexamethasone , Midazolam  and Treatment may vary due to age or medical condition  Airway Management Planned: LMA  Additional Equipment: None  Intra-op Plan:   Post-operative Plan: Extubation in OR  Informed Consent: I have reviewed the patients History and Physical, chart, labs and discussed the procedure including the risks, benefits and alternatives for the proposed anesthesia with the patient or authorized representative who has indicated his/her understanding and acceptance.     Dental advisory given and Interpreter used for interview  Plan Discussed with: CRNA and Anesthesiologist  Anesthesia Plan Comments:          Anesthesia Quick Evaluation  "

## 2024-09-09 ENCOUNTER — Ambulatory Visit (HOSPITAL_COMMUNITY): Payer: Self-pay | Admitting: Anesthesiology

## 2024-09-09 ENCOUNTER — Encounter (HOSPITAL_COMMUNITY): Payer: Self-pay | Admitting: Surgery

## 2024-09-09 ENCOUNTER — Ambulatory Visit: Admitting: Obstetrics & Gynecology

## 2024-09-09 ENCOUNTER — Ambulatory Visit (HOSPITAL_COMMUNITY)
Admission: RE | Admit: 2024-09-09 | Discharge: 2024-09-09 | Disposition: A | Discharge status: Home / Self Care | Payer: Self-pay | Attending: Surgery | Admitting: Surgery

## 2024-09-09 ENCOUNTER — Other Ambulatory Visit: Payer: Self-pay

## 2024-09-09 ENCOUNTER — Other Ambulatory Visit (HOSPITAL_COMMUNITY): Payer: Self-pay

## 2024-09-09 ENCOUNTER — Encounter (HOSPITAL_COMMUNITY): Admission: RE | Disposition: A | Payer: Self-pay | Source: Home / Self Care | Attending: Surgery

## 2024-09-09 DIAGNOSIS — Z992 Dependence on renal dialysis: Secondary | ICD-10-CM | POA: Insufficient documentation

## 2024-09-09 DIAGNOSIS — N186 End stage renal disease: Secondary | ICD-10-CM | POA: Insufficient documentation

## 2024-09-09 DIAGNOSIS — I120 Hypertensive chronic kidney disease with stage 5 chronic kidney disease or end stage renal disease: Secondary | ICD-10-CM

## 2024-09-09 DIAGNOSIS — D631 Anemia in chronic kidney disease: Secondary | ICD-10-CM | POA: Insufficient documentation

## 2024-09-09 DIAGNOSIS — Z79899 Other long term (current) drug therapy: Secondary | ICD-10-CM | POA: Insufficient documentation

## 2024-09-09 DIAGNOSIS — E1122 Type 2 diabetes mellitus with diabetic chronic kidney disease: Secondary | ICD-10-CM | POA: Insufficient documentation

## 2024-09-09 DIAGNOSIS — Z794 Long term (current) use of insulin: Secondary | ICD-10-CM | POA: Insufficient documentation

## 2024-09-09 HISTORY — PX: BASCILIC VEIN TRANSPOSITION: SHX5742

## 2024-09-09 LAB — POCT I-STAT, CHEM 8
BUN: 32 mg/dL — ABNORMAL HIGH (ref 6–20)
Calcium, Ion: 1.02 mmol/L — ABNORMAL LOW (ref 1.15–1.40)
Chloride: 95 mmol/L — ABNORMAL LOW (ref 98–111)
Creatinine, Ser: 5.5 mg/dL — ABNORMAL HIGH (ref 0.44–1.00)
Glucose, Bld: 168 mg/dL — ABNORMAL HIGH (ref 70–99)
HCT: 34 % — ABNORMAL LOW (ref 36.0–46.0)
Hemoglobin: 11.6 g/dL — ABNORMAL LOW (ref 12.0–15.0)
Potassium: 4.6 mmol/L (ref 3.5–5.1)
Sodium: 136 mmol/L (ref 135–145)
TCO2: 27 mmol/L (ref 22–32)

## 2024-09-09 LAB — GLUCOSE, CAPILLARY
Glucose-Capillary: 169 mg/dL — ABNORMAL HIGH (ref 70–99)
Glucose-Capillary: 164 mg/dL — ABNORMAL HIGH (ref 70–99)

## 2024-09-09 LAB — HCG, SERUM, QUALITATIVE: Preg, Serum: NEGATIVE

## 2024-09-09 MED ORDER — PHENYLEPHRINE 80 MCG/ML (10ML) SYRINGE FOR IV PUSH (FOR BLOOD PRESSURE SUPPORT)
PREFILLED_SYRINGE | INTRAVENOUS | Status: DC | PRN
  Administered 2024-09-09 (×3): 80 ug via INTRAVENOUS

## 2024-09-09 MED ORDER — CHLORHEXIDINE GLUCONATE 0.12 % MT SOLN
15.0000 mL | Freq: Once | OROMUCOSAL | Status: AC
  Administered 2024-09-09: 15 mL via OROMUCOSAL
  Filled 2024-09-09: qty 15

## 2024-09-09 MED ORDER — HEPARIN 6000 UNIT IRRIGATION SOLUTION
Status: DC | PRN
  Administered 2024-09-09: 1

## 2024-09-09 MED ORDER — HEMOSTATIC AGENTS (NO CHARGE) OPTIME
TOPICAL | Status: DC | PRN
  Administered 2024-09-09: 1 via TOPICAL

## 2024-09-09 MED ORDER — MIDAZOLAM HCL (PF) 2 MG/2ML IJ SOLN
INTRAMUSCULAR | Status: DC | PRN
  Administered 2024-09-09: 2 mg via INTRAVENOUS

## 2024-09-09 MED ORDER — ONDANSETRON HCL 4 MG/2ML IJ SOLN: 4.0000 mg | Freq: Once | INTRAMUSCULAR | Status: DC | PRN

## 2024-09-09 MED ORDER — SODIUM CHLORIDE (PF) 0.9 % IJ SOLN
INTRAMUSCULAR | Status: AC
  Filled 2024-09-09: qty 50

## 2024-09-09 MED ORDER — OXYCODONE-ACETAMINOPHEN 5-325 MG PO TABS
1.0000 | ORAL_TABLET | Freq: Four times a day (QID) | ORAL | 0 refills | Status: AC | PRN
  Filled 2024-09-09: qty 20, 5d supply, fill #0

## 2024-09-09 MED ORDER — LIDOCAINE 2% (20 MG/ML) 5 ML SYRINGE
INTRAMUSCULAR | Status: DC | PRN
  Administered 2024-09-09: 60 mg via INTRAVENOUS

## 2024-09-09 MED ORDER — MIDAZOLAM HCL 2 MG/2ML IJ SOLN
INTRAMUSCULAR | Status: AC
  Filled 2024-09-09: qty 2

## 2024-09-09 MED ORDER — BUPIVACAINE HCL (PF) 0.5 % IJ SOLN
INTRAMUSCULAR | Status: AC
  Filled 2024-09-09: qty 30

## 2024-09-09 MED ORDER — SODIUM CHLORIDE FLUSH 0.9 % IV SOLN
INTRAVENOUS | Status: DC | PRN
  Administered 2024-09-09: 75 mL

## 2024-09-09 MED ORDER — CEFAZOLIN SODIUM-DEXTROSE 2-4 GM/100ML-% IV SOLN
2.0000 g | INTRAVENOUS | Status: AC
  Administered 2024-09-09: 2 g via INTRAVENOUS
  Filled 2024-09-09: qty 100

## 2024-09-09 MED ORDER — AMISULPRIDE (ANTIEMETIC) 5 MG/2ML IV SOLN
INTRAVENOUS | Status: AC
  Filled 2024-09-09: qty 4

## 2024-09-09 MED ORDER — FENTANYL CITRATE (PF) 100 MCG/2ML IJ SOLN
INTRAMUSCULAR | Status: AC
  Filled 2024-09-09: qty 2

## 2024-09-09 MED ORDER — AMISULPRIDE (ANTIEMETIC) 5 MG/2ML IV SOLN
10.0000 mg | Freq: Once | INTRAVENOUS | Status: AC
  Administered 2024-09-09: 10 mg via INTRAVENOUS

## 2024-09-09 MED ORDER — INSULIN ASPART 100 UNIT/ML IJ SOLN: 0.0000 [IU] | INTRAMUSCULAR | Status: DC | PRN

## 2024-09-09 MED ORDER — CHLORHEXIDINE GLUCONATE 4 % EX SOLN: 60.0000 mL | Freq: Once | CUTANEOUS | Status: DC

## 2024-09-09 MED ORDER — GLYCOPYRROLATE PF 0.2 MG/ML IJ SOSY
PREFILLED_SYRINGE | INTRAMUSCULAR | Status: DC | PRN
  Administered 2024-09-09: .1 mg via INTRAVENOUS

## 2024-09-09 MED ORDER — FENTANYL CITRATE (PF) 100 MCG/2ML IJ SOLN: 25.0000 ug | INTRAMUSCULAR | Status: DC | PRN

## 2024-09-09 MED ORDER — SODIUM CHLORIDE 0.9 % IV SOLN: INTRAVENOUS | Status: DC

## 2024-09-09 MED ORDER — LIDOCAINE 2% (20 MG/ML) 5 ML SYRINGE
INTRAMUSCULAR | Status: AC
  Filled 2024-09-09: qty 5

## 2024-09-09 MED ORDER — OXYCODONE HCL 5 MG PO TABS: 5.0000 mg | ORAL_TABLET | Freq: Once | ORAL | Status: DC | PRN

## 2024-09-09 MED ORDER — PROPOFOL 10 MG/ML IV BOLUS
INTRAVENOUS | Status: AC
  Filled 2024-09-09: qty 20

## 2024-09-09 MED ORDER — ONDANSETRON HCL 4 MG/2ML IJ SOLN
INTRAMUSCULAR | Status: DC | PRN
  Administered 2024-09-09: 4 mg via INTRAVENOUS

## 2024-09-09 MED ORDER — 0.9 % SODIUM CHLORIDE (POUR BTL) OPTIME
TOPICAL | Status: DC | PRN
  Administered 2024-09-09: 1000 mL

## 2024-09-09 MED ORDER — ORAL CARE MOUTH RINSE: 15.0000 mL | Freq: Once | OROMUCOSAL | Status: AC

## 2024-09-09 MED ORDER — ONDANSETRON HCL 4 MG/2ML IJ SOLN
INTRAMUSCULAR | Status: AC
  Filled 2024-09-09: qty 2

## 2024-09-09 MED ORDER — GLYCOPYRROLATE PF 0.2 MG/ML IJ SOSY
PREFILLED_SYRINGE | INTRAMUSCULAR | Status: AC
  Filled 2024-09-09: qty 1

## 2024-09-09 MED ORDER — HEPARIN 6000 UNIT IRRIGATION SOLUTION
Status: AC
  Filled 2024-09-09: qty 500

## 2024-09-09 MED ORDER — BUPIVACAINE LIPOSOME 1.3 % IJ SUSP
INTRAMUSCULAR | Status: AC
  Filled 2024-09-09: qty 20

## 2024-09-09 MED ORDER — PROPOFOL 10 MG/ML IV BOLUS
INTRAVENOUS | Status: DC | PRN
  Administered 2024-09-09: 120 mg via INTRAVENOUS

## 2024-09-09 MED ORDER — OXYCODONE HCL 5 MG/5ML PO SOLN: 5.0000 mg | Freq: Once | ORAL | Status: DC | PRN

## 2024-09-09 MED ORDER — PHENYLEPHRINE HCL-NACL 20-0.9 MG/250ML-% IV SOLN
INTRAVENOUS | Status: DC | PRN
  Administered 2024-09-09: 40 ug/min via INTRAVENOUS

## 2024-09-09 MED ORDER — FENTANYL CITRATE (PF) 250 MCG/5ML IJ SOLN
INTRAMUSCULAR | Status: DC | PRN
  Administered 2024-09-09 (×4): 25 ug via INTRAVENOUS

## 2024-09-09 MED ORDER — ACETAMINOPHEN 500 MG PO TABS
1000.0000 mg | ORAL_TABLET | Freq: Once | ORAL | Status: AC
  Administered 2024-09-09: 1000 mg via ORAL
  Filled 2024-09-09: qty 2

## 2024-09-09 MED ORDER — PHENYLEPHRINE 80 MCG/ML (10ML) SYRINGE FOR IV PUSH (FOR BLOOD PRESSURE SUPPORT)
PREFILLED_SYRINGE | INTRAVENOUS | Status: AC
  Filled 2024-09-09: qty 10

## 2024-09-09 NOTE — Transfer of Care (Signed)
 Immediate Anesthesia Transfer of Care Note  Patient: Michele Morales  Procedure(s) Performed: TRANSPOSITION, VEIN, BASILIC (Left)  Patient Location: PACU  Anesthesia Type:General  Level of Consciousness: awake and patient cooperative  Airway & Oxygen Therapy: Patient connected to face mask oxygen  Post-op Assessment: Report given to RN and Post -op Vital signs reviewed and stable  Post vital signs: Reviewed and stable  Last Vitals:  Vitals Value Taken Time  BP 158/76 09/09/24 09:53  Temp    Pulse 87 09/09/24 09:54  Resp 18 09/09/24 09:53  SpO2 100 % 09/09/24 09:54  Vitals shown include unfiled device data.  Last Pain:  Vitals:   09/09/24 0953  TempSrc:   PainSc: 0-No pain         Complications: There were no known notable events for this encounter.

## 2024-09-09 NOTE — Progress Notes (Signed)
 Mariel [phone], surgical spanish interpreter. Available for PACU.   Joesph LOISE Stake, RN

## 2024-09-09 NOTE — Anesthesia Procedure Notes (Signed)
 Procedure Name: LMA Insertion Date/Time: 09/09/2024 7:43 AM  Performed by: Christopher Comings, CRNAPre-anesthesia Checklist: Patient identified, Emergency Drugs available, Suction available and Patient being monitored Patient Re-evaluated:Patient Re-evaluated prior to induction Oxygen Delivery Method: Circle system utilized Preoxygenation: Pre-oxygenation with 100% oxygen Induction Type: IV induction Ventilation: Mask ventilation without difficulty LMA: LMA inserted LMA Size: 4.0 Number of attempts: 1 Placement Confirmation: positive ETCO2 and breath sounds checked- equal and bilateral Tube secured with: Tape Dental Injury: Teeth and Oropharynx as per pre-operative assessment

## 2024-09-09 NOTE — Discharge Instructions (Addendum)
" ° °  [facility]  Discharge Instructions  AV Fistula or Graft Surgery for Dialysis Access  Please refer to the following instructions for your post-procedure care. Your surgeon or physician assistant will discuss any changes with you.  Activity  You may drive the day following your surgery, if you are comfortable and no longer taking prescription pain medication. Resume full activity as the soreness in your incision resolves.  Bathing/Showering  You may shower after you go home. Keep your incision dry for 48 hours. Do not soak in a bathtub, hot tub, or swim until the incision heals completely. You may not shower if you have a hemodialysis catheter.  Incision Care  Clean your incision with mild soap and water after 48 hours. Pat the area dry with a clean towel. You do not need a bandage unless otherwise instructed. Do not apply any ointments or creams to your incision. You may have skin glue on your incision. Do not peel it off. It will come off on its own in about one week. Your arm may swell a bit after surgery. To reduce swelling use pillows to elevate your arm so it is above your heart. Your doctor will tell you if you need to lightly wrap your arm with an ACE bandage.  Diet  Resume your normal diet. There are not special food restrictions following this procedure. In order to heal from your surgery, it is CRITICAL to get adequate nutrition. Your body requires vitamins, minerals, and protein. Vegetables are the best source of vitamins and minerals. Vegetables also provide the perfect balance of protein. Processed food has little nutritional value, so try to avoid this.  Medications  Resume taking all of your medications. If your incision is causing pain, you may take over-the counter pain relievers such as acetaminophen  (Tylenol ). If you were prescribed a stronger pain medication, please be aware these medications can cause nausea and constipation. Prevent  nausea by taking the medication with a snack or meal. Avoid constipation by drinking plenty of fluids and eating foods with high amount of fiber, such as fruits, vegetables, and grains.  Do not take Tylenol  if you are taking prescription pain medications.  Follow up Your surgeon may want to see you in the office following your access surgery. If so, this will be arranged at the time of your surgery.  Please call us  immediately for any of the following conditions:  Increased pain, redness, drainage (pus) from your incision site Fever of 101 degrees or higher Severe or worsening pain at your incision site Hand pain or numbness.  Reduce your risk of vascular disease:  Stop smoking. If you would like help, call [facility] at 1-800-QUIT-NOW ((319)802-4969) or [place] at [phone]  Manage your cholesterol Maintain a desired weight Control your diabetes Keep your blood pressure down  Dialysis  It will take several weeks to several months for your new dialysis access to be ready for use. Your surgeon will determine when it is okay to use it. Your nephrologist will continue to direct your dialysis. You can continue to use your Permcath until your new access is ready for use.   09/09/2024 Michele Morales [id number] 16-Oct-1975  Surgeon(s): Serene Gaile ORN, MD  Procedures: TRANSPOSITION, VEIN, BASILIC  x Do not stick fistula for 5 weeks    If you have any questions, please call the office at [phone].  "

## 2024-09-09 NOTE — Anesthesia Postprocedure Evaluation (Signed)
"   Anesthesia Post Note  Patient: Michele Morales  Procedure(s) Performed: TRANSPOSITION, VEIN, BASILIC (Left)     Patient location during evaluation: PACU Anesthesia Type: General Level of consciousness: awake and alert Pain management: pain level controlled Vital Signs Assessment: post-procedure vital signs reviewed and stable Respiratory status: spontaneous breathing, nonlabored ventilation and respiratory function stable Cardiovascular status: stable and blood pressure returned to baseline Anesthetic complications: no   There were no known notable events for this encounter.  Last Vitals:  Vitals:   09/09/24 1030 09/09/24 1045  BP: (!) 159/75 132/61  Pulse: 95 88  Resp: 14 13  Temp:  36.8 C  SpO2: 98% 97%    Last Pain:  Vitals:   09/09/24 0953  TempSrc:   PainSc: 0-No pain                 Debby FORBES Like      "

## 2024-09-09 NOTE — H&P (Signed)
 "     Postoperative Access Visit     History of Present Illness    Michele Morales is a 49 y.o. year old female who presents for postoperative follow-up for: removal of temporary dialysis catheter, placement of right internal jugular TDC and left brachiobasilic AV fistula creation on 03/30/24 by Dr. Serene. A video interpreter was used to obtain patient history today as she is Spanish speaking only. The patient's wounds are well healed.  The patient notes no steal symptoms.  The patient is able to complete their activities of daily living.  She dialyzes via a right internal jugular TDC on TTS at the [facility].     Physical Examination       Vitals:    08/01/24 1120  BP: (!) 178/80  Pulse: (!) 106  Temp: 97.7 F (36.5 C)  TempSrc: Temporal  Weight: 127 lb 3.2 oz (57.7 kg)    Body mass index is 23.27 kg/m.   left arm Incision is well healed, 2+ radial pulse, hand grip is 5/5, sensation in digits is intact,palpable thrill, bruit can  be auscultated       Non Invasive Vascular lab evaluation    VAS US  Duplex Dialysis Access (AVF, AVG): Findings:  +--------------------+----------+-----------------+--------+  AVF                PSV (cm/s)Flow Vol (mL/min)Comments  +--------------------+----------+-----------------+--------+  Native artery inflow   258          1573                 +--------------------+----------+-----------------+--------+  AVF Anastomosis        522          1393                 +--------------------+----------+-----------------+--------+    +-------------+----------+-------------+----------+----------------+  OUTFLOW VEIN PSV (cm/s)Diameter (cm)Depth (cm)    Describe      +-------------+----------+-------------+----------+----------------+  Axillary vein   189                                             +-------------+----------+-------------+----------+----------------+  Prox UA          81        0.58         1.16   competing branch  +-------------+----------+-------------+----------+----------------+  Mid UA          137        0.70        1.17   competing branch  +-------------+----------+-------------+----------+----------------+  Dist UA         598        0.70        0.77                     +-------------+----------+-------------+----------+----------------+   Summary:  Patent arteriovenous fistula.      Medical Decision Making    Michele Morales is a 49 y.o. year old female who presents s/p  removal of temporary dialysis catheter, placement of right internal jugular TDC and left brachiobasilic AV fistula creation on 03/30/24 by Dr. Serene. Her incision has healed well. Patent is without signs or symptoms of steal syndrome. Duplex shows patent AV fistula that has matured well. Adequate volume flow. Vein is deep in the left upper arm She is not on any blood thinners She will need scheduled  for left 2nd stage Basilic Vein Transposition with Dr. Serene in the near future on a non dialysis day     Teretha Damme, PA-C [facility] Office: [phone]   Clinic MD: Serene    Discussed procedure again with interpreter. All questions answered  Wells Ramel Tobon "

## 2024-09-10 ENCOUNTER — Encounter (HOSPITAL_COMMUNITY): Payer: Self-pay | Admitting: Surgery

## 2024-09-11 NOTE — Op Note (Signed)
" ° ° °  Patient name: Michele Morales MRN: [id number] DOB: 1976-05-01 Sex: female  09/09/2024 Pre-operative Diagnosis: ESRD Post-operative diagnosis:  Same Surgeon:  Malvina New Assistants:  Lucie Apt, PA Procedure:   Revision of left basilic vein fistula (second stage, branch ligation and elevation) Anesthesia:  General Blood Loss:  minimal Specimens:  none  Findings: Excellent caliber vein  Indications: This is a 49 year old female who comes in today for revision of her fistula  Procedure:  The patient was identified in the holding area and taken to [facility] OR ROOM 16  The patient was then placed supine on the table. general anesthesia was administered.  The patient was prepped and draped in the usual sterile fashion.  A time out was called and antibiotics were administered.  A PA was necessary to expedite the procedure and assist with technical details.  She helped with exposure by providing suction and retraction.  She helped with the anastomosis by following the suture.  She helped with wound closure  Ultrasound was used to mark the course of the vein in the left upper arm.  It was of excellent caliber.  2 longitudinal incisions were made over top of the fistula in the upper arm.  The fistula was fully mobilized from the antecubital crease up to the axilla.  There were several branches that were ligated between silk ties.  The nerves were protected.  Once the vein was fully mobilized, it was marked for orientation and then divided near the antecubital crease.  I marked out the anticipated location of the tunnel on the skin and then infiltrated this area with Exparel .  A curved tunneler was used to create a tunnel and the vein was brought through the tunnel making sure to maintain proper orientation.  A end to end anastomosis was then performed to the antecubital crease with running 6-0 Prolene.  Prior to completion the appropriate flushing maneuvers were performed and the anastomosis was  completed.  The clamps were released.  There was an excellent thrill within the fistula.  I inspected the course of the vein to make sure there were no kinks.  Wound was irrigated.  Hemostasis was achieved.  The deep tissue was reapproximated with 3-0 Vicryl and the skin was closed with running Monocryl followed by Dermabond and compression wrap.  There were no immediate complications.  She was successfully extubated and taken recovery in stable condition.   Disposition: To PACU stable.   ALONSO Malvina New, M.D., [facility] [place] Office: [phone] Pager:  [phone]  "

## 2024-09-26 ENCOUNTER — Ambulatory Visit: Payer: Self-pay | Admitting: Physician Assistant

## 2024-09-26 ENCOUNTER — Encounter: Payer: Self-pay | Admitting: Physician Assistant

## 2024-09-26 VITALS — BP 201/98 | Ht 62.0 in | Wt 123.4 lb

## 2024-09-26 DIAGNOSIS — N186 End stage renal disease: Secondary | ICD-10-CM
